# Patient Record
Sex: Female | Born: 1965
Health system: Southern US, Community
[De-identification: ages and names within clinical notes are randomized; demographics above are authoritative.]

## PROBLEM LIST (undated history)

## (undated) DIAGNOSIS — N362 Urethral caruncle: Secondary | ICD-10-CM

## (undated) DIAGNOSIS — A0472 Enterocolitis due to Clostridium difficile, not specified as recurrent: Secondary | ICD-10-CM

## (undated) DIAGNOSIS — Z9889 Other specified postprocedural states: Secondary | ICD-10-CM

## (undated) DIAGNOSIS — R87619 Unspecified abnormal cytological findings in specimens from cervix uteri: Secondary | ICD-10-CM

## (undated) DIAGNOSIS — F411 Generalized anxiety disorder: Secondary | ICD-10-CM

## (undated) DIAGNOSIS — A630 Anogenital (venereal) warts: Secondary | ICD-10-CM

## (undated) DIAGNOSIS — Z8742 Personal history of other diseases of the female genital tract: Secondary | ICD-10-CM

## (undated) DIAGNOSIS — M1990 Unspecified osteoarthritis, unspecified site: Secondary | ICD-10-CM

## (undated) DIAGNOSIS — R87629 Unspecified abnormal cytological findings in specimens from vagina: Secondary | ICD-10-CM

## (undated) DIAGNOSIS — J45909 Unspecified asthma, uncomplicated: Secondary | ICD-10-CM

## (undated) DIAGNOSIS — F988 Other specified behavioral and emotional disorders with onset usually occurring in childhood and adolescence: Secondary | ICD-10-CM

## (undated) DIAGNOSIS — N8110 Cystocele, unspecified: Secondary | ICD-10-CM

## (undated) DIAGNOSIS — E079 Disorder of thyroid, unspecified: Secondary | ICD-10-CM

## (undated) DIAGNOSIS — E785 Hyperlipidemia, unspecified: Secondary | ICD-10-CM

## (undated) DIAGNOSIS — Z8709 Personal history of other diseases of the respiratory system: Secondary | ICD-10-CM

## (undated) DIAGNOSIS — Z973 Presence of spectacles and contact lenses: Secondary | ICD-10-CM

## (undated) DIAGNOSIS — Z8639 Personal history of other endocrine, nutritional and metabolic disease: Secondary | ICD-10-CM

## (undated) DIAGNOSIS — E559 Vitamin D deficiency, unspecified: Secondary | ICD-10-CM

## (undated) DIAGNOSIS — F32A Depression, unspecified: Secondary | ICD-10-CM

## (undated) DIAGNOSIS — M8580 Other specified disorders of bone density and structure, unspecified site: Secondary | ICD-10-CM

## (undated) DIAGNOSIS — F909 Attention-deficit hyperactivity disorder, unspecified type: Secondary | ICD-10-CM

## (undated) DIAGNOSIS — F329 Major depressive disorder, single episode, unspecified: Secondary | ICD-10-CM

## (undated) DIAGNOSIS — B977 Papillomavirus as the cause of diseases classified elsewhere: Secondary | ICD-10-CM

## (undated) DIAGNOSIS — N3281 Overactive bladder: Secondary | ICD-10-CM

## (undated) DIAGNOSIS — F419 Anxiety disorder, unspecified: Secondary | ICD-10-CM

## (undated) DIAGNOSIS — R112 Nausea with vomiting, unspecified: Secondary | ICD-10-CM

## (undated) DIAGNOSIS — Z8489 Family history of other specified conditions: Secondary | ICD-10-CM

## (undated) HISTORY — PX: BLADDER SUSPENSION: SHX72

## (undated) HISTORY — PX: COLPOSCOPY: SHX161

## (undated) HISTORY — DX: Unspecified abnormal cytological findings in specimens from vagina: R87.629

## (undated) HISTORY — DX: Hyperlipidemia, unspecified: E78.5

## (undated) HISTORY — PX: LEEP: SHX91

## (undated) HISTORY — DX: Cystocele, unspecified: N81.10

## (undated) HISTORY — PX: AUGMENTATION MAMMAPLASTY: SUR837

## (undated) HISTORY — PX: COLONOSCOPY: SHX174

## (undated) HISTORY — DX: Disorder of thyroid, unspecified: E07.9

## (undated) HISTORY — DX: Papillomavirus as the cause of diseases classified elsewhere: B97.7

## (undated) HISTORY — PX: LAPAROTOMY: SHX154

## (undated) HISTORY — PX: ABDOMINAL HYSTERECTOMY: SHX81

## (undated) HISTORY — DX: Enterocolitis due to Clostridium difficile, not specified as recurrent: A04.72

## (undated) HISTORY — PX: BREAST SURGERY: SHX581

## (undated) HISTORY — DX: Anogenital (venereal) warts: A63.0

## (undated) HISTORY — DX: Unspecified abnormal cytological findings in specimens from cervix uteri: R87.619

---

## 2001-10-31 HISTORY — PX: ABDOMINAL HYSTERECTOMY: SHX81

## 2013-08-25 LAB — HM MAMMOGRAPHY: HM Mammogram: NORMAL

## 2014-08-25 ENCOUNTER — Encounter: Payer: Self-pay | Admitting: Family Medicine

## 2014-08-25 ENCOUNTER — Ambulatory Visit (INDEPENDENT_AMBULATORY_CARE_PROVIDER_SITE_OTHER): Payer: 59 | Admitting: Family Medicine

## 2014-08-25 VITALS — BP 132/80 | HR 69 | Ht 68.5 in | Wt 164.0 lb

## 2014-08-25 DIAGNOSIS — F900 Attention-deficit hyperactivity disorder, predominantly inattentive type: Secondary | ICD-10-CM

## 2014-08-25 DIAGNOSIS — R413 Other amnesia: Secondary | ICD-10-CM

## 2014-08-25 DIAGNOSIS — F909 Attention-deficit hyperactivity disorder, unspecified type: Secondary | ICD-10-CM

## 2014-08-25 DIAGNOSIS — F329 Major depressive disorder, single episode, unspecified: Secondary | ICD-10-CM | POA: Insufficient documentation

## 2014-08-25 DIAGNOSIS — K9041 Non-celiac gluten sensitivity: Secondary | ICD-10-CM

## 2014-08-25 DIAGNOSIS — K900 Celiac disease: Secondary | ICD-10-CM

## 2014-08-25 DIAGNOSIS — F32A Depression, unspecified: Secondary | ICD-10-CM | POA: Insufficient documentation

## 2014-08-25 DIAGNOSIS — R480 Dyslexia and alexia: Secondary | ICD-10-CM

## 2014-08-25 DIAGNOSIS — E559 Vitamin D deficiency, unspecified: Secondary | ICD-10-CM | POA: Insufficient documentation

## 2014-08-25 MED ORDER — AMPHETAMINE-DEXTROAMPHET ER 10 MG PO CP24: 10.0000 mg | ORAL_CAPSULE | Freq: Every day | ORAL | Status: DC

## 2014-08-25 NOTE — Progress Notes (Signed)
CC: Jacqueline Hughes is a 48 y.o. female is here for Establish Care   Subjective: HPI:  Patient complains of a least one year of diffuse joint pain, memory loss, fatigue, fluctuation in weight that has been present on a daily basis until she tried to eliminate gluten in her diet. It sounds like she was able to do this for about 3 months and had 99.9% resolution of the above symptoms. She restarted gluten in her diet because of "gluten withdrawal" that was not further specified. She wants to know if there is a blood test that can be done to check for celiac disease, she would like to avoid a small bowel biopsy if possible. She denies unintentional weight loss, skin changes, skin lesions, hair changes but does report vitamin deficiencies specifically vitamin D and she believes others that she forgets specifics about now. Interventions have included frequent checks of her thyroid which have been normal for the past 3 years but she was once on a low dose of Synthroid for a mildly elevated TSH.. Other than above nothing seems to make better or worse.  She further finds her memory loss as distractibility, difficulty with word finding, forgetting students names, and forgetting tasks that have been completed. Symptoms are improved without Adderall or Ritalin but are still interfering with quality of life even if she takes these medications.  Review of Systems - General ROS: negative for - chills, fever, night sweats, Ophthalmic ROS: negative for - decreased vision Psychological ROS: negative for - depression ENT ROS: negative for - hearing change, nasal congestion, tinnitus or allergies Hematological and Lymphatic ROS: negative for - bleeding problems, bruising or swollen lymph nodes Breast ROS: negative Respiratory ROS: no cough, shortness of breath, or wheezing Cardiovascular ROS: no chest pain or dyspnea on exertion Gastrointestinal ROS: no abdominal pain, change in bowel habits, or black or bloody  stools Genito-Urinary ROS: negative for - genital discharge, genital ulcers, incontinence or abnormal bleeding from genitals Musculoskeletal ROS: negative for - joint pain or muscle pain other than that described above Neurological ROS: negative for - headaches Dermatological ROS: negative for lumps, mole changes, rash and skin lesion changes  Past Medical History  Diagnosis Date  . Hyperlipidemia   . Thyroid disease     History reviewed. No pertinent past surgical history. Family History  Problem Relation Age of Onset  . Alcoholism      granparents  . Depression      aunts    History   Social History  . Marital Status: Married    Spouse Name: N/A    Number of Children: N/A  . Years of Education: N/A   Occupational History  . Not on file.   Social History Main Topics  . Smoking status: Former Smoker    Quit date: 08/25/2004  . Smokeless tobacco: Not on file  . Alcohol Use: No  . Drug Use: No  . Sexual Activity: Yes    Partners: Male   Other Topics Concern  . Not on file   Social History Narrative  . No narrative on file     Objective: BP 132/80  Pulse 69  Ht 5' 8.5" (1.74 m)  Wt 164 lb (74.39 kg)  BMI 24.57 kg/m2  Vital signs reviewed. General: Alert and Oriented, No Acute Distress HEENT: Pupils equal, round, reactive to light. Conjunctivae clear.  External ears unremarkable.  Moist mucous membranes. Lungs: Clear and comfortable work of breathing, speaking in full sentences without accessory muscle use. Cardiac: Regular  rate and rhythm.  Neuro: CN II-XII grossly intact, gait normal. Extremities: No peripheral edema.  Strong peripheral pulses.  Mental Status: No depression, anxiety, nor agitation. Logical though process. Tearful when talking about memory loss Skin: Warm and dry.  Assessment & Plan: [name] was seen today for establish care.  Diagnoses and associated orders for this visit:  Adult ADHD - amphetamine-dextroamphetamine (ADDERALL XR) 10 MG  24 hr capsule; Take 1 capsule (10 mg total) by mouth daily.  Gluten intolerance - Gliadin antibodies, serum - Tissue transglutaminase, IgA - Reticulin Antibody, IgA w reflex titer - IgA  Memory loss - Ambulatory referral to Neurology  Dyslexia    Adult ADHD: Encouraged her to restart taking her Adderall which she has been off of for about a month now.  Gluten intolerance: Checking labs above to rule out celiac disease Memory loss: We did a Mini-Mental state examination today where she had a score of 27. She has a master's degree in psychology. She reports she is carries a diagnosis of dyslexia and not surprisingly she had difficulty with spelling world backwards and would not consider trying subtracting serial sevens.  We will refer her for formal neuropsych testing given the severity of symptoms and persistence.  Return if symptoms worsen or fail to improve.

## 2014-08-26 LAB — GLIADIN ANTIBODIES, SERUM
Gliadin IgA: 14.6 U/mL (ref <20)
Gliadin IgG: 4.6 U/mL (ref <20)

## 2014-08-26 LAB — IGA: IgA: 592 mg/dL — ABNORMAL HIGH (ref 69–380)

## 2014-08-26 LAB — RETICULIN ANTIBODIES, IGA W TITER: Reticulin Ab, IgA: NEGATIVE

## 2014-08-26 LAB — TISSUE TRANSGLUTAMINASE, IGA: TISSUE TRANSGLUTAMINASE AB, IGA: 15.6 U/mL (ref <20)

## 2014-08-29 ENCOUNTER — Encounter: Payer: Self-pay | Admitting: Family Medicine

## 2014-08-29 DIAGNOSIS — K9049 Malabsorption due to intolerance, not elsewhere classified: Secondary | ICD-10-CM | POA: Insufficient documentation

## 2014-10-03 ENCOUNTER — Other Ambulatory Visit: Payer: Self-pay

## 2014-10-03 DIAGNOSIS — F909 Attention-deficit hyperactivity disorder, unspecified type: Secondary | ICD-10-CM

## 2014-10-03 MED ORDER — AMPHETAMINE-DEXTROAMPHET ER 10 MG PO CP24: 10.0000 mg | ORAL_CAPSULE | Freq: Every day | ORAL | Status: DC

## 2014-11-18 ENCOUNTER — Telehealth: Payer: Self-pay | Admitting: Family Medicine

## 2014-11-18 DIAGNOSIS — F909 Attention-deficit hyperactivity disorder, unspecified type: Secondary | ICD-10-CM

## 2014-11-18 MED ORDER — AMPHETAMINE-DEXTROAMPHET ER 10 MG PO CP24: 10.0000 mg | ORAL_CAPSULE | Freq: Every day | ORAL | Status: DC

## 2014-11-18 NOTE — Telephone Encounter (Signed)
Patient was in the bldg with son and stopped by to request refill for her Adderall.  She is wondering if you would be willing to write a script for this med for refills but date it in advance (and she cant get it filled until it is due).  I see that you have only seen her once in Oct. 2015.  I mentioned to her that i know with this type of medication, you would want to see her every three months and or possibly every six months.

## 2014-11-18 NOTE — Telephone Encounter (Signed)
Jacqueline Hughes, Rx placed in in-box ready for pickup/faxing. Based on my note I may not have told her that she needs to come in every 3 months for f/u.  I'll give her one more Rx to last her until f/u.

## 2014-11-19 NOTE — Telephone Encounter (Signed)
Message left on vm 

## 2015-01-05 ENCOUNTER — Encounter: Payer: Self-pay | Admitting: Family Medicine

## 2015-01-05 ENCOUNTER — Ambulatory Visit (INDEPENDENT_AMBULATORY_CARE_PROVIDER_SITE_OTHER): Payer: 59 | Admitting: Family Medicine

## 2015-01-05 VITALS — BP 125/83 | HR 81 | Wt 155.0 lb

## 2015-01-05 DIAGNOSIS — E559 Vitamin D deficiency, unspecified: Secondary | ICD-10-CM

## 2015-01-05 DIAGNOSIS — F909 Attention-deficit hyperactivity disorder, unspecified type: Secondary | ICD-10-CM

## 2015-01-05 DIAGNOSIS — F900 Attention-deficit hyperactivity disorder, predominantly inattentive type: Secondary | ICD-10-CM

## 2015-01-05 MED ORDER — AMPHETAMINE-DEXTROAMPHET ER 10 MG PO CP24: 10.0000 mg | ORAL_CAPSULE | Freq: Every day | ORAL | Status: DC

## 2015-01-05 MED ORDER — VITAMIN D (ERGOCALCIFEROL) 1.25 MG (50000 UNIT) PO CAPS: 50000.0000 [IU] | ORAL_CAPSULE | ORAL | Status: DC

## 2015-01-05 NOTE — Progress Notes (Signed)
CC: Jacqueline Hughes is a 49 y.o. female is here for f/u medications   Subjective: HPI:   Follow-up ADHD: She continues to take 10 mg of Adderall XR most days of the week, every work day but not always on the weekends. She tells me that she does not take this medication and goes to work she has been forgetful to the point where she has lost positions before. Provided she takes this on a daily basis she has no difficulty with distractibility or accountability at work. No known side effects from this medication. Denies sleep disturbance appetite suppression paranoia nor anxiety.  Follow vitamin D deficiency: She is asking for refill of her weekly vitamin D supplement. She tells me that in the past she has tried controlling her vitamin D level with over-the-counter vitamin D however has always been unsuccessful in her former PCP was only able to get her level stabilized by taking it weekly supplement indefinitely.   Review Of Systems Outlined In HPI  Past Medical History  Diagnosis Date  . Hyperlipidemia   . Thyroid disease     No past surgical history on file. Family History  Problem Relation Age of Onset  . Alcoholism      granparents  . Depression      aunts    History   Social History  . Marital Status: Married    Spouse Name: N/A  . Number of Children: N/A  . Years of Education: N/A   Occupational History  . Not on file.   Social History Main Topics  . Smoking status: Former Smoker    Quit date: 08/25/2004  . Smokeless tobacco: Not on file  . Alcohol Use: No  . Drug Use: No  . Sexual Activity:    Partners: Male   Other Topics Concern  . Not on file   Social History Narrative     Objective: BP 125/83 mmHg  Pulse 81  Wt 155 lb (70.308 kg)  Vital signs reviewed. General: Alert and Oriented, No Acute Distress HEENT: Pupils equal, round, reactive to light. Conjunctivae clear.  External ears unremarkable.  Moist mucous membranes. Lungs: Clear and comfortable work  of breathing, speaking in full sentences without accessory muscle use. Cardiac: Regular rate and rhythm.  Neuro: CN II-XII grossly intact, gait normal. Extremities: No peripheral edema.  Strong peripheral pulses.  Mental Status: No depression, anxiety, nor agitation. Logical though process. Skin: Warm and dry. Assessment & Plan: Sherian was seen today for f/u medications.  Diagnoses and all orders for this visit:  Adult ADHD Orders: -     Discontinue: amphetamine-dextroamphetamine (ADDERALL XR) 10 MG 24 hr capsule; Take 1 capsule (10 mg total) by mouth daily. -     Discontinue: amphetamine-dextroamphetamine (ADDERALL XR) 10 MG 24 hr capsule; Take 1 capsule (10 mg total) by mouth daily. -     amphetamine-dextroamphetamine (ADDERALL XR) 10 MG 24 hr capsule; Take 1 capsule (10 mg total) by mouth daily.  Vitamin D deficiency Orders: -     Vitamin D, Ergocalciferol, (DRISDOL) 50000 UNITS CAPS capsule; Take 1 capsule (50,000 Units total) by mouth every 7 (seven) days.   ADHD: Controlled continue 10 mg of Adderall daily but not necessarily on the weekends is not needed. Vitamin D deficiency: controlled,Refills provided of vitamin D  She's been able lose 10 pounds since I saw her last by cutting out creamer in her coffee.  Return in about 3 months (around 04/07/2015) for ADHD.

## 2015-01-26 ENCOUNTER — Encounter: Payer: Self-pay | Admitting: *Deleted

## 2015-01-26 ENCOUNTER — Emergency Department
Admission: EM | Admit: 2015-01-26 | Discharge: 2015-01-26 | Disposition: A | Discharge status: Home / Self Care | Payer: 59 | Source: Home / Self Care | Attending: Family Medicine | Admitting: Family Medicine

## 2015-01-26 DIAGNOSIS — B349 Viral infection, unspecified: Secondary | ICD-10-CM | POA: Diagnosis not present

## 2015-01-26 DIAGNOSIS — K120 Recurrent oral aphthae: Secondary | ICD-10-CM

## 2015-01-26 LAB — POCT CBC W AUTO DIFF (K'VILLE URGENT CARE)

## 2015-01-26 LAB — POCT RAPID STREP A (OFFICE): Rapid Strep A Screen: NEGATIVE

## 2015-01-26 MED ORDER — TRIAMCINOLONE ACETONIDE 0.1 % MT PSTE: PASTE | OROMUCOSAL | Status: DC

## 2015-01-26 NOTE — ED Notes (Signed)
Pt c/o bilateral ear ache, swollen lymph nodes in neck, sore in the right side of her mouth, and resolving rash all over her body x 1 wk.

## 2015-01-26 NOTE — ED Provider Notes (Signed)
CSN: [id number]     Arrival date & time 01/26/15  1907 History   First MD Initiated Contact with Patient 01/26/15 2006     Chief Complaint  Patient presents with  . Lymphadenopathy      HPI Comments: Patient reports that about 10 days ago she became fatigued with myalgias, and about  3 to 4 days later she developed aching in her ears and neck.  No sore throat or cough.  Her lips have been persistently dry and she has developed a sore spot in her right mouth.  About 5 days ago she developed a generalized pruritic rash that is fading.  She took Valtrex 1gm daily for four days.  The history is provided by the patient.    Past Medical History  Diagnosis Date  . Hyperlipidemia   . Thyroid disease    History reviewed. No pertinent past surgical history. Family History  Problem Relation Age of Onset  . Alcoholism      granparents  . Depression      aunts   History  Substance Use Topics  . Smoking status: Former Smoker    Quit date: 08/25/2004  . Smokeless tobacco: Not on file  . Alcohol Use: No   OB History    No data available     Review of Systems No sore throat No cough No pleuritic pain No wheezing No nasal congestion No post-nasal drainage No sinus pain/pressure No itchy/red eyes + earache No hemoptysis No SOB No fever/chills No nausea No vomiting No abdominal pain No diarrhea No urinary symptoms No skin rash + fatigue + myalgias/arthralgias No headache Used OTC meds without relief  Allergies  E-mycin  Home Medications   Prior to Admission medications   Medication Sig Start Date End Date Taking? Authorizing Provider  amphetamine-dextroamphetamine (ADDERALL XR) 10 MG 24 hr capsule Take 1 capsule (10 mg total) by mouth daily. 01/05/15  Yes Sean Hommel, DO  FLUoxetine (PROZAC) 20 MG capsule Take 20 mg by mouth daily.   Yes Historical Provider, MD  Multiple Vitamin (MULTIVITAMIN) tablet Take 1 tablet by mouth daily.   Yes Historical Provider, MD   valACYclovir (VALTREX) 1000 MG tablet Take 1,000 mg by mouth 2 (two) times daily.   Yes Historical Provider, MD  Vitamin D, Ergocalciferol, (DRISDOL) 50000 UNITS CAPS capsule Take 1 capsule (50,000 Units total) by mouth every 7 (seven) days. 01/05/15  Yes Sean Hommel, DO  triamcinolone (KENALOG) 0.1 % paste Place thin layer on mouth ulcers four times daily 01/26/15   Kandra Nicolas, MD   BP 113/78 mmHg  Pulse 79  Temp(Src) 98.1 F (36.7 C) (Oral)  Resp 16  Ht 5\' 8"  (1.727 m)  Wt 150 lb (68.04 kg)  BMI 22.81 kg/m2  SpO2 98% Physical Exam Nursing notes and Vital Signs reviewed. Appearance:  Patient appears stated age, and in no acute distress Eyes:  Pupils are equal, round, and reactive to light and accomodation.  Extraocular movement is intact.  Conjunctivae are not inflamed  Ears:  Canals normal.  Tympanic membranes normal.  Nose:  Mildly congested turbinates.  No sinus tenderness.   Mouth:  Shallow aphthous ulcer right buccal mucosae Pharynx:  Normal Neck:  Supple.  Tender shotty posterior nodes are palpated bilaterally.  There is tenderness over right axillary and supraclavicular nodes.  Lungs:  Clear to auscultation.  Breath sounds are equal.  Heart:  Regular rate and rhythm without murmurs, rubs, or gallops.  Abdomen:  Nontender without masses or hepatosplenomegaly.  Bowel sounds are present.  No CVA or flank tenderness.  Extremities:  No edema.  No calf tenderness Skin:  Faint macular erythematous rash on trunk and arms    ED Course  Procedures  None    Labs Reviewed  POCT CBC W AUTO DIFF ([facility]):  WBC 6.8; LY 37.0; MO 2.9; GR 60.1; Hgb 12.6; Platelets 329  POCT rapid strep test negative      MDM   1. Viral syndrome with exanthem; ?mono   2. Aphthous ulcer    Treat symptomatically for now:  Kenalog in [place] for aphthous ulcer. May take Ibuprofen 200mg , 4 tabs every 8 hours with food.  Increase fluid intake.  Check temperature daily. Followup with  Family Doctor if not improved in one week.     Kandra Nicolas, MD 01/31/15 [phone]

## 2015-01-26 NOTE — Discharge Instructions (Signed)
May take Ibuprofen 200mg , 4 tabs every 8 hours with food.  Increase fluid intake.  Check temperature daily.   Viral Exanthems, Adult Many viral infections of the skin are called viral exanthems. Exanthem is another name for a rash or skin eruption. The most common viral exanthems include the following:  [place] measles or rubella.  Measles or rubeola.  Roseola.  Parvovirus B19 (Erythema infectiosum or Fifth disease).  Chickenpox or varicella. DIAGNOSIS  Sometimes, other problems may cause a rash that looks like a viral exanthem. Most often, your caregiver can determine whether you have a viral exanthem by looking at the rash. They usually have distinct patterns or appearance. Lab work may be done if the diagnosis is uncertain. Sometimes, a small tissue sample (biopsy) of the rash may need to be taken. TREATMENT  Immunizations have led to a decrease in the number of cases of measles, mumps, and rubella. Viral exanthems may require clinical treatment if a bacterial infection or other problems follow. The rash may be associated with:  Minor sore throat.  Aches and pains.  Runny nose.  Watery eyes.  Tiredness.  Some coughs.  Gastrointestinal infections causing nausea, vomiting, and diarrhea. Viral exanthems do not respond to antibiotic medicines, because they are not caused by bacteria. HOME CARE INSTRUCTIONS   Only take over-the-counter or prescription medicines for pain, discomfort, diarrhea, or fever as directed by your caregiver.  Drink enough water and fluids to keep your urine clear or pale yellow. SEEK MEDICAL CARE IF:  You develop swollen neck glands. This may feel like lumps or bumps in the neck.  You develop tenderness over your sinuses.  You are not feeling partly better after 3 days.  You develop muscle aches.  You are feeling more tired than you would expect.  You get a persistent cough with mucus. SEEK IMMEDIATE MEDICAL CARE IF:   You have a  fever.  You develop red eyes or eye pain.  You develop sores in your mouth and difficulty drinking or eating.  You develop a sore throat with pus and difficulty swallowing.  You develop neck pain or a stiff neck.  You develop a severe headache.  You develop vomiting that will not stop. Document Released: 01/07/2003 Document Revised: 01/09/2012 Document Reviewed: 01/04/2011 [facility] Patient Information 2015 [place], [place]. This information is not intended to replace advice given to you by your health care provider. Make sure you discuss any questions you have with your health care provider.   Canker Sores  Canker sores are painful, open sores on the inside of the mouth and cheek. They may be white or yellow. The sores usually heal in 1 to 2 weeks. Women are more likely than men to have recurrent canker sores. CAUSES The cause of canker sores is not well understood. More than one cause is likely. Canker sores do not appear to be caused by certain types of germs (viruses or bacteria). Canker sores may be caused by:  An allergic reaction to certain foods.  Digestive problems.  Not having enough vitamin M84, folic acid, and iron.  Female sex hormones. Sores may come only during certain phases of a menstrual cycle. Often, there is improvement during pregnancy.  Genetics. Some people seem to inherit canker sore problems. Emotional stress and injuries to the mouth may trigger outbreaks, but not cause them.  DIAGNOSIS Canker sores are diagnosed by exam.  TREATMENT  Patients who have frequent bouts of canker sores may have cultures taken of the sores, blood tests,  or allergy tests. This helps determine if their sores are caused by a poor diet, an allergy, or some other preventable or treatable disease.  Vitamins may prevent recurrences or reduce the severity of canker sores in people with poor nutrition.  Numbing ointments can relieve pain. These are available in drug stores without a  prescription.  Anti-inflammatory steroid mouth rinses or gels may be prescribed by your caregiver for severe sores.  Oral steroids may be prescribed if you have severe, recurrent canker sores. These strong medicines can cause many side effects and should be used only under the close direction of a dentist or physician.  Mouth rinses containing the antibiotic medicine may be prescribed. They may lessen symptoms and speed healing. Healing usually happens in about 1 or 2 weeks with or without treatment. Certain antibiotic mouth rinses given to pregnant women and young children can permanently stain teeth. Talk to your caregiver about your treatment. HOME CARE INSTRUCTIONS   Avoid foods that cause canker sores for you.  Avoid citrus juices, spicy or salty foods, and coffee until the sores are healed.  Use a soft-bristled toothbrush.  Chew your food carefully to avoid biting your cheek.  Apply topical numbing medicine to the sore to help relieve pain.  Apply a thin paste of baking soda and water to the sore to help heal the sore.  Only use mouth rinses or medicines for pain or discomfort as directed by your caregiver. SEEK MEDICAL CARE IF:   Your symptoms are not better in 1 week.  Your sores are still present after 2 weeks.  Your sores are very painful.  You have trouble breathing or swallowing.  Your sores come back frequently. Document Released: 02/11/2011 Document Revised: 02/11/2013 Document Reviewed: 02/11/2011 [facility] Patient Information 2015 [place], [place]. This information is not intended to replace advice given to you by your health care provider. Make sure you discuss any questions you have with your health care provider.

## 2015-01-27 ENCOUNTER — Ambulatory Visit: Payer: 59 | Admitting: Family Medicine

## 2015-06-17 ENCOUNTER — Encounter: Payer: Self-pay | Admitting: Family Medicine

## 2015-06-17 ENCOUNTER — Ambulatory Visit (INDEPENDENT_AMBULATORY_CARE_PROVIDER_SITE_OTHER): Payer: 59 | Admitting: Family Medicine

## 2015-06-17 VITALS — BP 109/75 | HR 74 | Wt 153.0 lb

## 2015-06-17 DIAGNOSIS — Z1239 Encounter for other screening for malignant neoplasm of breast: Secondary | ICD-10-CM

## 2015-06-17 DIAGNOSIS — E559 Vitamin D deficiency, unspecified: Secondary | ICD-10-CM | POA: Diagnosis not present

## 2015-06-17 DIAGNOSIS — F900 Attention-deficit hyperactivity disorder, predominantly inattentive type: Secondary | ICD-10-CM | POA: Diagnosis not present

## 2015-06-17 DIAGNOSIS — M8580 Other specified disorders of bone density and structure, unspecified site: Secondary | ICD-10-CM | POA: Diagnosis not present

## 2015-06-17 DIAGNOSIS — F909 Attention-deficit hyperactivity disorder, unspecified type: Secondary | ICD-10-CM

## 2015-06-17 DIAGNOSIS — D171 Benign lipomatous neoplasm of skin and subcutaneous tissue of trunk: Secondary | ICD-10-CM

## 2015-06-17 MED ORDER — VALACYCLOVIR HCL 1 G PO TABS: 1000.0000 mg | ORAL_TABLET | Freq: Every day | ORAL | Status: DC | PRN

## 2015-06-17 MED ORDER — AMPHETAMINE-DEXTROAMPHET ER 10 MG PO CP24: 10.0000 mg | ORAL_CAPSULE | Freq: Every day | ORAL | Status: DC

## 2015-06-17 NOTE — Progress Notes (Signed)
CC: Jacqueline Hughes is a 49 y.o. female is here for lump on rt side and refill adderall   Subjective: HPI:   Follow-up osteopenia: In 2010 she was found to have a mild osteopenia value on a DEXA scan. She has not had a follow-up of this and is requesting one today. She denies any joint or bone pain. She has a history of vitamin D deficiency currently taking vitamin D supplements every week.  Follow-up ADHD: She did not take any Adderall over the summer. 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 Denies known side effects such as appetite suppression or weight loss or sleep disturbance  She had a mammogram done in 2014 which was normal, she is requesting a repeat to be done with no breast complaints or concerns other than normal screening for breast cancer.  She has a lump on her right upper quadrant of the abdomen periods been there since 2008. It's slightly tender to the touch. It does not seem to be getting larger or smaller since onset. She brought to the attention of her former PCP however this individual brushed off the patient's concerns and the patient is worried that it might be something that absolutely needs to be removed. She denies any additional weight loss, night sweats, fevers, chills nor overlying skin changes     Review Of Systems Outlined In HPI  Past Medical History  Diagnosis Date  . Hyperlipidemia   . Thyroid disease     No past surgical history on file. Family History  Problem Relation Age of Onset  . Alcoholism      granparents  . Depression      aunts    Social History   Social History  . Marital Status: Married    Spouse Name: N/A  . Number of Children: N/A  . Years of Education: N/A   Occupational History  . Not on file.   Social History Main Topics  . Smoking status: Former Smoker    Quit date: 08/25/2004  . Smokeless tobacco: Not  on file  . Alcohol Use: No  . Drug Use: No  . Sexual Activity:    Partners: Male   Other Topics Concern  . Not on file   Social History Narrative     Objective: BP 109/75 mmHg  Pulse 74  Wt 153 lb (69.4 kg)  General: Alert and Oriented, No Acute Distress HEENT: Pupils equal, round, reactive to light. Conjunctivae clear.  Moist pedis membranes Lungs: Clear to auscultation bilaterally, no wheezing/ronchi/rales.  Comfortable work of breathing. Good air movement. Cardiac: Regular rate and rhythm. Normal S1/S2.  No murmurs, rubs, nor gallops.   Abdomen: Soft flat nontender no palpable masses in the abdominal region however just above the costochondral junction of the seventh rib on the right there is a nontender spongy subcutaneous mass approximately 1 cm x 3 cm. Extremities: No peripheral edema.  Strong peripheral pulses.  Mental Status: No depression, anxiety, nor agitation. Skin: Warm and dry.  The palpated mass above was reviewed under ultrasound reflecting echotexture identical to surrounding fat however this nodule is encapsulated. There is no increased signal to suggest increased calcification within the mass.  Assessment & Plan: [name] was seen today for lump on rt side and refill adderall.  Diagnoses and all orders for this visit:  Vitamin D deficiency  Adult ADHD -  Discontinue: amphetamine-dextroamphetamine (ADDERALL XR) 10 MG 24 hr capsule; Take 1 capsule (10 mg total) by mouth daily. -     Discontinue: amphetamine-dextroamphetamine (ADDERALL XR) 10 MG 24 hr capsule; Take 1 capsule (10 mg total) by mouth daily. May fill on/after September 16th 2016 -     amphetamine-dextroamphetamine (ADDERALL XR) 10 MG 24 hr capsule; Take 1 capsule (10 mg total) by mouth daily. May fill on/after October 15th 2016  Screening for breast cancer -     MM DIGITAL SCREENING BILATERAL  Osteopenia -     DG Bone Density  Lipoma of abdominal wall  Other orders -     valACYclovir  (VALTREX) 1000 MG tablet; Take 1 tablet (1,000 mg total) by mouth daily as needed.  Reassurance provided that her exam is most suggestive of a benign lipoma, she's not interested in having it removed of its benign which seems reasonable. Vitamin D deficiency with history of osteopenia: An order has been placed for a DEXA scan Adult ADHD: Controlled with Adderall Screening for breast cancer: Referral has been placed Requesting refills on Valtrex her old prescription had expired for management of cold sores.  Return in about 3 months (around 09/17/2015).

## 2015-08-13 ENCOUNTER — Ambulatory Visit: Payer: 59

## 2015-08-13 ENCOUNTER — Ambulatory Visit (INDEPENDENT_AMBULATORY_CARE_PROVIDER_SITE_OTHER): Payer: 59

## 2015-08-13 DIAGNOSIS — M8580 Other specified disorders of bone density and structure, unspecified site: Secondary | ICD-10-CM | POA: Diagnosis not present

## 2015-08-16 ENCOUNTER — Encounter: Payer: Self-pay | Admitting: Family Medicine

## 2015-08-16 DIAGNOSIS — M8580 Other specified disorders of bone density and structure, unspecified site: Secondary | ICD-10-CM | POA: Insufficient documentation

## 2015-12-02 MED FILL — DEXTROAMP-AMPHET ER 10 MG C: 10 | 30 days supply | Qty: 30 | Fill #0

## 2015-12-28 ENCOUNTER — Telehealth: Payer: Self-pay | Admitting: Family Medicine

## 2015-12-28 ENCOUNTER — Other Ambulatory Visit: Payer: Self-pay

## 2015-12-28 DIAGNOSIS — F909 Attention-deficit hyperactivity disorder, unspecified type: Secondary | ICD-10-CM

## 2015-12-28 MED ORDER — AMPHETAMINE-DEXTROAMPHET ER 10 MG PO CP24: 10.0000 mg | ORAL_CAPSULE | Freq: Every day | ORAL | Status: DC

## 2015-12-28 NOTE — Telephone Encounter (Signed)
Pt is allowed to have a 30 day supply (Per Dr. Ileene Rubens) but need a follow up appointment for more refills

## 2015-12-28 NOTE — Telephone Encounter (Signed)
Pt came in and stated she needs a refill on her adderall ... Thanks

## 2015-12-28 NOTE — Telephone Encounter (Signed)
Pt came in and stated she needs a refill on her adderall .. thanks

## 2016-01-20 ENCOUNTER — Encounter: Payer: Self-pay | Admitting: Family Medicine

## 2016-01-20 ENCOUNTER — Ambulatory Visit (INDEPENDENT_AMBULATORY_CARE_PROVIDER_SITE_OTHER): Payer: 59 | Admitting: Family Medicine

## 2016-01-20 VITALS — BP 128/88 | HR 69 | Wt 153.0 lb

## 2016-01-20 DIAGNOSIS — E559 Vitamin D deficiency, unspecified: Secondary | ICD-10-CM | POA: Diagnosis not present

## 2016-01-20 DIAGNOSIS — F329 Major depressive disorder, single episode, unspecified: Secondary | ICD-10-CM | POA: Diagnosis not present

## 2016-01-20 DIAGNOSIS — F900 Attention-deficit hyperactivity disorder, predominantly inattentive type: Secondary | ICD-10-CM | POA: Diagnosis not present

## 2016-01-20 DIAGNOSIS — F32A Depression, unspecified: Secondary | ICD-10-CM

## 2016-01-20 DIAGNOSIS — B001 Herpesviral vesicular dermatitis: Secondary | ICD-10-CM

## 2016-01-20 DIAGNOSIS — F909 Attention-deficit hyperactivity disorder, unspecified type: Secondary | ICD-10-CM

## 2016-01-20 MED ORDER — VITAMIN D (ERGOCALCIFEROL) 1.25 MG (50000 UNIT) PO CAPS: 50000.0000 [IU] | ORAL_CAPSULE | ORAL | Status: DC

## 2016-01-20 MED ORDER — ATOMOXETINE HCL 40 MG PO CAPS: 40.0000 mg | ORAL_CAPSULE | Freq: Every day | ORAL | Status: DC

## 2016-01-20 MED ORDER — VALACYCLOVIR HCL 1 G PO TABS: 1000.0000 mg | ORAL_TABLET | Freq: Every day | ORAL | Status: DC | PRN

## 2016-01-20 MED ORDER — FLUOXETINE HCL 20 MG PO CAPS: 20.0000 mg | ORAL_CAPSULE | Freq: Every day | ORAL | Status: DC

## 2016-01-20 MED FILL — FLUoxetine HCL 20 MG CAPS: 20 | 90 days supply | Qty: 90 | Fill #0 | Status: TO

## 2016-01-20 MED FILL — STRATTERA 40 MG CAPSULE: 40 | 30 days supply | Qty: 30 | Fill #0

## 2016-01-20 MED FILL — VIT D2 1.25 MG (50,000 UNIT: 1.25 MG | 84 days supply | Qty: 12 | Fill #0 | Status: TO

## 2016-01-20 MED FILL — valACYclovir HCL 1 GM TABS: 1 | 30 days supply | Qty: 30 | Fill #0

## 2016-01-20 NOTE — Progress Notes (Signed)
CC: Jacqueline Hughes is a 50 y.o. female is here for Medication Refill   Subjective: HPI:  Follow-up ADHD: Taking 10 mg of Adderall XR on the days that she has to work. She does not believe that its fully effective and doses higher than 10 mg caused intolerable dry mouth. She tells me even though she's taking this medication on the days that she takes it she still feels like she's scatterbrained another's point out that she just can't sit still. Symptoms are interfering with her job but not to a degree where her job is in jeopardy. She wants something different she can take to help with the symptoms.  Follow-up depression: Taking Prozac on a daily basis with no known side effects. She denies any anxiety or depression.  She is requesting a refill on vitamin D for persistent vitamin D deficiency.  She continues to get cold sores if she is stressed out. For example her son was recently seen at a local emergency room for tachycardia and 3 days after this event she developed a cold sore. She is requesting refills on Valtrex   Review Of Systems Outlined In HPI  Past Medical History  Diagnosis Date  . Hyperlipidemia   . Thyroid disease     No past surgical history on file. Family History  Problem Relation Age of Onset  . Alcoholism      granparents  . Depression      aunts    Social History   Social History  . Marital Status: Married    Spouse Name: N/A  . Number of Children: N/A  . Years of Education: N/A   Occupational History  . Not on file.   Social History Main Topics  . Smoking status: Former Smoker    Quit date: 08/25/2004  . Smokeless tobacco: Not on file  . Alcohol Use: No  . Drug Use: No  . Sexual Activity:    Partners: Male   Other Topics Concern  . Not on file   Social History Narrative     Objective: BP 128/88 mmHg  Pulse 69  Wt 153 lb (69.4 kg)  Vital signs reviewed. General: Alert and Oriented, No Acute Distress HEENT: Pupils equal, round, reactive  to light. Conjunctivae clear.  External ears unremarkable.  Moist mucous membranes. Lungs: Clear and comfortable work of breathing, speaking in full sentences without accessory muscle use. Cardiac: Regular rate and rhythm.  Neuro: CN II-XII grossly intact, gait normal. Extremities: No peripheral edema.  Strong peripheral pulses.  Mental Status: No depression, anxiety, nor agitation. Logical though process. Skin: Warm and dry.  Assessment & Plan: Jacqueline Hughes was seen today for medication refill.  Diagnoses and all orders for this visit:  Vitamin D deficiency -     Vitamin D, Ergocalciferol, (DRISDOL) 50000 units CAPS capsule; Take 1 capsule (50,000 Units total) by mouth every 7 (seven) days.  Adult ADHD -     atomoxetine (STRATTERA) 40 MG capsule; Take 1 capsule (40 mg total) by mouth daily.  Depression -     FLUoxetine (PROZAC) 20 MG capsule; Take 1 capsule (20 mg total) by mouth daily.  Recurrent cold sores -     valACYclovir (VALTREX) 1000 MG tablet; Take 1 tablet (1,000 mg total) by mouth daily as needed. For cold sores.  Other orders -     Cancel: amphetamine-dextroamphetamine (ADDERALL XR) 10 MG 24 hr capsule; Take 1 capsule (10 mg total) by mouth daily. NEED A FOLLOW UP APPOINTMENT FOR MORE REFILLS  Vitamin D deficiency: Controlled with weekly vitamin D replacement Adult ADHD: Uncontrolled chronic condition switching to Straterra, if beneficial after 1-2 weeks of taking this medication please call for 6 months of refills. If ineffective please call to switch back to Adderall. I reminded her that if she is taking Adderall she needs to follow-up every 3-6 months since this is a controlled substance. Recurrent cold sores: Controlled with as needed use of Valtrex Depression: Controlled with Prozac  Return in about 6 months (around 07/22/2016).

## 2016-02-17 ENCOUNTER — Emergency Department
Admission: EM | Admit: 2016-02-17 | Discharge: 2016-02-17 | Disposition: A | Discharge status: Home / Self Care | Payer: 59 | Source: Home / Self Care | Attending: Family Medicine | Admitting: Family Medicine

## 2016-02-17 ENCOUNTER — Encounter: Payer: Self-pay | Admitting: *Deleted

## 2016-02-17 DIAGNOSIS — H6983 Other specified disorders of Eustachian tube, bilateral: Secondary | ICD-10-CM | POA: Diagnosis not present

## 2016-02-17 DIAGNOSIS — H9203 Otalgia, bilateral: Secondary | ICD-10-CM

## 2016-02-17 DIAGNOSIS — J029 Acute pharyngitis, unspecified: Secondary | ICD-10-CM | POA: Diagnosis not present

## 2016-02-17 HISTORY — DX: Depression, unspecified: F32.A

## 2016-02-17 HISTORY — DX: Major depressive disorder, single episode, unspecified: F32.9

## 2016-02-17 HISTORY — DX: Other specified disorders of bone density and structure, unspecified site: M85.80

## 2016-02-17 HISTORY — DX: Vitamin D deficiency, unspecified: E55.9

## 2016-02-17 HISTORY — DX: Anxiety disorder, unspecified: F41.9

## 2016-02-17 HISTORY — DX: Other specified behavioral and emotional disorders with onset usually occurring in childhood and adolescence: F98.8

## 2016-02-17 LAB — POCT RAPID STREP A (OFFICE): Rapid Strep A Screen: NEGATIVE

## 2016-02-17 MED ORDER — IBUPROFEN 800 MG PO TABS: 800.0000 mg | ORAL_TABLET | Freq: Three times a day (TID) | ORAL | Status: DC

## 2016-02-17 MED ORDER — BENZOCAINE-MENTHOL 15-10 MG MT LOZG: 1.0000 | LOZENGE | Freq: Four times a day (QID) | OROMUCOSAL | Status: DC | PRN

## 2016-02-17 MED ORDER — FLUTICASONE PROPIONATE 50 MCG/ACT NA SUSP: 2.0000 | Freq: Every day | NASAL | Status: DC

## 2016-02-17 MED ORDER — PREDNISONE 20 MG PO TABS: ORAL_TABLET | ORAL | Status: DC

## 2016-02-17 NOTE — Discharge Instructions (Signed)
You may take 400-600mg  Ibuprofen (Motrin) every 6-8 hours for fever and pain  Alternate with Tylenol  You may take 500mg  Tylenol every 4-6 hours as needed for fever and pain  Follow-up with your primary care provider next week for recheck of symptoms if not improving.  Be sure to drink plenty of fluids and rest, at least 8hrs of sleep a night, preferably more while you are sick. Return urgent care or go to closest ER if you cannot keep down fluids/signs of dehydration, fever not reducing with Tylenol, difficulty breathing/wheezing, stiff neck, worsening condition, or other concerns (see below)   Your symptoms are likely due to a virus such as the common cold, however, if you developing worsening sore throat with difficulty breathing or swallowing, persistent fever (>100.4*F) for 3 days, or symptoms not improving in 4-5 days please call our office or follow up with your primary care provider.    Your rapid strep test did come back Negative but a culture has been sent.  It usually takes 2-3 days to result.  If the test is Positive, you will be called and an antibiotic can be called in for you.

## 2016-02-17 NOTE — ED Notes (Signed)
Pt c/o sore throat and bilateral ear pain x 8 days. She reports chills one night, but is uncertain if she has had a fever. She has taken claritin and Benadryl.

## 2016-02-17 NOTE — ED Provider Notes (Signed)
CSN: [id number]     Arrival date & time 02/17/16  1711 History   First MD Initiated Contact with Patient 02/17/16 1731     Chief Complaint  Patient presents with  . Sore Throat  . Otalgia   (Consider location/radiation/quality/duration/timing/severity/associated sxs/prior Treatment) HPI  The pt is a 50yo female presenting to [facility] with c/o 8 days of sore throat and bilateral ear pain and pressure.  Throat pain is moderate to severe, worse with swallowing.  Mild intermittent dry cough when she is talking or her throat gets irritated. She has had chills but denies headache, abdominal pain, n/v/d. 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  She has been taking Claritin, Benadryl and Tylenol without relief of symptoms.  Denies difficulty breathing. She has been able to keep down fluids.   Past Medical History  Diagnosis Date  . Hyperlipidemia   . Thyroid disease   . ADD (attention deficit disorder)   . Vitamin D deficiency   . Osteopenia   . Anxiety   . Depression    Past Surgical History  Procedure Laterality Date  . Abdominal hysterectomy    . Breast surgery    . Laparotomy     Family History  Problem Relation Age of Onset  . Alcoholism      granparents  . Depression      aunts   Social History  Substance Use Topics  . Smoking status: Former Smoker    Quit date: 08/25/2004  . Smokeless tobacco: None  . Alcohol Use: Yes   OB History    No data available     Review of Systems  Constitutional: Positive for chills. Negative for fever, appetite change and fatigue.  HENT: Positive for ear pain ( bilateral) and sore throat. Negative for congestion, rhinorrhea, sinus pressure, sneezing, trouble swallowing and voice change.   Respiratory: Positive for cough. Negative for shortness of breath.   Cardiovascular: Negative for chest pain and palpitations.  Gastrointestinal: Negative for nausea, vomiting, abdominal pain and diarrhea.  Musculoskeletal: Negative for myalgias, back pain and  arthralgias.  Skin: Negative for rash.  All other systems reviewed and are negative.   Allergies  E-mycin  Home Medications   Prior to Admission medications   Medication Sig Start Date End Date Taking? Authorizing Provider  atomoxetine (STRATTERA) 40 MG capsule Take 1 capsule (40 mg total) by mouth daily. 01/20/16   Sean Hommel, DO  Benzocaine-Menthol (CHLORASEPTIC MAX SORE THROAT) 15-10 MG LOZG Use as directed 1 lozenge in the mouth or throat 4 (four) times daily as needed. 02/17/16   Noland Fordyce, PA-C  FLUoxetine (PROZAC) 20 MG capsule Take 1 capsule (20 mg total) by mouth daily. 01/20/16   Sean Hommel, DO  fluticasone (FLONASE) 50 MCG/ACT nasal spray Place 2 sprays into both nostrils daily. For 1 week, then daily as needed seasonally 02/17/16   Noland Fordyce, PA-C  ibuprofen (ADVIL,MOTRIN) 800 MG tablet Take 1 tablet (800 mg total) by mouth 3 (three) times daily. 02/17/16   Noland Fordyce, PA-C  Multiple Vitamin (MULTIVITAMIN) tablet Take 1 tablet by mouth daily.    Historical Provider, MD  predniSONE (DELTASONE) 20 MG tablet 3 tabs po day one, then 2 po daily x 4 days 02/17/16   Noland Fordyce, PA-C  valACYclovir (VALTREX) 1000 MG tablet Take 1 tablet (1,000 mg total) by mouth daily as needed. For cold sores. 01/20/16   Marcial Pacas, DO  Vitamin D, Ergocalciferol, (DRISDOL) 50000 units CAPS capsule Take 1 capsule (50,000 Units  total) by mouth every 7 (seven) days. 01/20/16   Marcial Pacas, DO   Meds Ordered and Administered this Visit  Medications - No data to display  BP 122/84 mmHg  Pulse 83  Temp(Src) 98.3 F (36.8 C) (Oral)  Resp 16  Ht 5' 8.5" (1.74 m)  Wt 156 lb (70.761 kg)  BMI 23.37 kg/m2  SpO2 100% No data found.   Physical Exam  Constitutional: She appears well-developed and well-nourished. No distress.  HENT:  Head: Normocephalic and atraumatic.  Right Ear: Tympanic membrane normal. Tympanic membrane is not erythematous and not bulging.  Left Ear: Tympanic membrane  normal. Tympanic membrane is not erythematous and not bulging.  Nose: Mucosal edema present. Right sinus exhibits no maxillary sinus tenderness and no frontal sinus tenderness. Left sinus exhibits no maxillary sinus tenderness and no frontal sinus tenderness.  Mouth/Throat: Uvula is midline and mucous membranes are normal. Posterior oropharyngeal edema and posterior oropharyngeal erythema present. No oropharyngeal exudate or tonsillar abscesses.  Eyes: Conjunctivae are normal. No scleral icterus.  Neck: Normal range of motion. Neck supple.  Cardiovascular: Normal rate, regular rhythm and normal heart sounds.   Pulmonary/Chest: Effort normal and breath sounds normal. No stridor. No respiratory distress. She has no wheezes. She has no rales.  Musculoskeletal: Normal range of motion.  Lymphadenopathy:    She has no cervical adenopathy.  Neurological: She is alert.  Skin: Skin is warm and dry. She is not diaphoretic.  Nursing note and vitals reviewed.   ED Course  Procedures (including critical care time)  Labs Review Labs Reviewed  STREP A DNA PROBE  POCT RAPID STREP A (OFFICE)    Imaging Review No results found.    MDM   1. Acute pharyngitis, unspecified etiology   2. Ear pain, bilateral   3. Eustachian tube dysfunction, bilateral    Pt c/o bilateral ear pain and sore throat for 8 days, associated dry cough. Pt is afebrile, moist mucous membranes. Tonsillar erythema and edema w/o exudates. No evidence of peritonsillar abscess.  Tympanometry: Negative peak pressure in Left and Right ear, c/w eustachian tube dysfunction, common cold or allergies.  Rapid strep: NEGATIVE Will send strep culture  Reassured pt symptoms are likely viral. Encouraged symptomatic treatment. Rx: Prednisone, Flonase, Ibuprofen 800mg , and benzocaine-menthol lozenges   Encouraged fluids and rest. Work note provided for tomorrow. Advised pt culture results should come back in 2-3 days, antibiotic will be  called in if indicated.  She may call office if she develops persistent fever or not improving in 4-5 days.  F/u with PCP if not improving in 1 week. Patient verbalized understanding and agreement with treatment plan.     Noland Fordyce, PA-C 02/17/16 1811

## 2016-02-23 ENCOUNTER — Telehealth: Payer: Self-pay | Admitting: *Deleted

## 2016-02-23 LAB — STREP A DNA PROBE: GASP: NOT DETECTED

## 2016-02-23 MED ORDER — AMOXICILLIN 875 MG PO TABS: 875.0000 mg | ORAL_TABLET | Freq: Two times a day (BID) | ORAL | Status: DC

## 2016-08-22 ENCOUNTER — Other Ambulatory Visit: Payer: Self-pay | Admitting: Family Medicine

## 2016-08-22 DIAGNOSIS — Z1231 Encounter for screening mammogram for malignant neoplasm of breast: Secondary | ICD-10-CM

## 2016-08-30 ENCOUNTER — Ambulatory Visit (INDEPENDENT_AMBULATORY_CARE_PROVIDER_SITE_OTHER): Payer: 59

## 2016-08-30 ENCOUNTER — Ambulatory Visit (INDEPENDENT_AMBULATORY_CARE_PROVIDER_SITE_OTHER): Payer: 59 | Admitting: Physician Assistant

## 2016-08-30 VITALS — BP 115/71 | HR 77 | Temp 98.3°F | Wt 146.8 lb

## 2016-08-30 DIAGNOSIS — Z1231 Encounter for screening mammogram for malignant neoplasm of breast: Secondary | ICD-10-CM

## 2016-08-30 DIAGNOSIS — Z23 Encounter for immunization: Secondary | ICD-10-CM

## 2016-08-30 MED FILL — FLUoxetine HCL 20 MG CAPS: 20 | 90 days supply | Qty: 90 | Fill #0

## 2016-08-30 NOTE — Progress Notes (Signed)
Patient came into clinic today for flu and tdap vaccination. Patient tolerated injection of flu immunization in right deltoid well, with no immediate complications. Patient tolerated tdap immunization in left deltoid well, no immediate complications. Advised to contact our office with any questions/concerns.

## 2016-09-16 ENCOUNTER — Encounter: Payer: Self-pay | Admitting: Physician Assistant

## 2016-09-16 ENCOUNTER — Ambulatory Visit (INDEPENDENT_AMBULATORY_CARE_PROVIDER_SITE_OTHER): Payer: 59 | Admitting: Physician Assistant

## 2016-09-16 VITALS — BP 120/60 | HR 73 | Ht 68.5 in | Wt 147.0 lb

## 2016-09-16 DIAGNOSIS — Z131 Encounter for screening for diabetes mellitus: Secondary | ICD-10-CM | POA: Diagnosis not present

## 2016-09-16 DIAGNOSIS — F330 Major depressive disorder, recurrent, mild: Secondary | ICD-10-CM | POA: Diagnosis not present

## 2016-09-16 DIAGNOSIS — Z13220 Encounter for screening for lipoid disorders: Secondary | ICD-10-CM

## 2016-09-16 MED ORDER — FLUOXETINE HCL 20 MG PO CAPS: 20.0000 mg | ORAL_CAPSULE | Freq: Every day | ORAL | 3 refills | Status: DC

## 2016-09-16 NOTE — Progress Notes (Signed)
   Subjective:    Patient ID: Jacqueline Hughes, female    DOB: 01-Apr-1966, 50 y.o.   MRN: [id number]  HPI Pt is a 50 yo female who presents to the clinic to establish care.   .. Active Ambulatory Problems    Diagnosis Date Noted  . Adult ADHD 08/25/2014  . Depression 08/25/2014  . Vitamin D deficiency 08/25/2014  . Dyslexia 08/25/2014  . Food intolerance 08/29/2014  . Osteopenia 08/16/2015  . Recurrent cold sores 01/20/2016   Resolved Ambulatory Problems    Diagnosis Date Noted  . No Resolved Ambulatory Problems   Past Medical History:  Diagnosis Date  . ADD (attention deficit disorder)   . Anxiety   . Depression   . Hyperlipidemia   . Osteopenia   . Thyroid disease   . Vitamin D deficiency    . Family History  Problem Relation Age of Onset  . Alcoholism      granparents  . Depression      aunts   .[name] Social History   Social History  . Marital status: Married    Spouse name: N/A  . Number of children: N/A  . Years of education: N/A   Occupational History  . Not on file.   Social History Main Topics  . Smoking status: Former Smoker    Quit date: 08/25/2004  . Smokeless tobacco: Not on file  . Alcohol use Yes  . Drug use: No  . Sexual activity: Yes    Partners: Male   Other Topics Concern  . Not on file   Social History Narrative  . No narrative on file   Pt needs refill on prozac for depression. She is controlled on this medication. She occasonally has "down" moments in the winter but able to push through. No side effects with medication. She has been controlled for at least 2 years.    Review of Systems  All other systems reviewed and are negative.      Objective:   Physical Exam  Constitutional: She is oriented to person, place, and time. She appears well-developed and well-nourished.  HENT:  Head: Normocephalic and atraumatic.  Cardiovascular: Normal rate, regular rhythm and normal heart sounds.   Pulmonary/Chest: Effort normal and breath  sounds normal.  Neurological: She is alert and oriented to person, place, and time.  Psychiatric: She has a normal mood and affect. Her behavior is normal.          Assessment & Plan:  Marland KitchenMarland KitchenDiagnoses and all orders for this visit:  Mild episode of recurrent major depressive disorder (HCC) -     FLUoxetine (PROZAC) 20 MG capsule; Take 1 capsule (20 mg total) by mouth daily.  Screening for lipid disorders -     Lipid panel  Screening for diabetes mellitus -     COMPLETE METABOLIC PANEL WITH GFR   PHQ-9 was 4.  prozac refilled for one year.  Screening labs ordered.  Follow up in 1 year or as needed.

## 2016-10-12 MED FILL — VIT D2 1.25 MG (50,000 UNIT: 1.25 MG | 84 days supply | Qty: 12 | Fill #0

## 2016-11-20 ENCOUNTER — Encounter: Payer: Self-pay | Admitting: Emergency Medicine

## 2016-11-20 ENCOUNTER — Emergency Department
Admission: EM | Admit: 2016-11-20 | Discharge: 2016-11-20 | Disposition: A | Discharge status: Home / Self Care | Payer: 59 | Source: Home / Self Care | Attending: Family Medicine | Admitting: Family Medicine

## 2016-11-20 DIAGNOSIS — L03213 Periorbital cellulitis: Secondary | ICD-10-CM

## 2016-11-20 DIAGNOSIS — H1089 Other conjunctivitis: Secondary | ICD-10-CM | POA: Diagnosis not present

## 2016-11-20 MED ORDER — PREDNISONE 20 MG PO TABS: ORAL_TABLET | ORAL | 0 refills | Status: DC

## 2016-11-20 MED ORDER — CLINDAMYCIN HCL 300 MG PO CAPS: 300.0000 mg | ORAL_CAPSULE | Freq: Three times a day (TID) | ORAL | 0 refills | Status: DC

## 2016-11-20 MED ORDER — METHYLPREDNISOLONE SODIUM SUCC 125 MG IJ SOLR
80.0000 mg | Freq: Once | INTRAMUSCULAR | Status: AC
  Administered 2016-11-20: 80 mg via INTRAMUSCULAR

## 2016-11-20 MED ORDER — POLYMYXIN B-TRIMETHOPRIM 10000-0.1 UNIT/ML-% OP SOLN: 1.0000 [drp] | OPHTHALMIC | 0 refills | Status: DC

## 2016-11-20 NOTE — Discharge Instructions (Signed)
Begin prednisone Monday, 11/21/16. Continue to apply cool compress several times daily.  May take Tylenol as needed for pain.  If symptoms become significantly worse during the night or over the weekend, proceed to the local emergency room.

## 2016-11-20 NOTE — ED Provider Notes (Signed)
[facility]    CSN: [id number] Arrival date & time: 11/20/16  1327     History   Chief Complaint Chief Complaint  Patient presents with  . Conjunctivitis    HPI Jacqueline Hughes is a 51 y.o. female.   Patient complains of onset of left pinkeye one week ago, with itching and increased lacrimation.  No recent cold or URI symptoms.  During the past two days she has had increased swelling and soreness of her left upper and lower lids.  No photophobia.  No pain with eye movement.  No changes in vision.  No fevers, chills, and sweats.   The history is provided by the patient.    Past Medical History:  Diagnosis Date  . ADD (attention deficit disorder)   . Anxiety   . Depression   . Hyperlipidemia   . Osteopenia   . Thyroid disease   . Vitamin D deficiency     Patient Active Problem List   Diagnosis Date Noted  . Recurrent cold sores 01/20/2016  . Osteopenia 08/16/2015  . Food intolerance 08/29/2014  . Adult ADHD 08/25/2014  . Depression 08/25/2014  . Vitamin D deficiency 08/25/2014  . Dyslexia 08/25/2014    Past Surgical History:  Procedure Laterality Date  . ABDOMINAL HYSTERECTOMY    . BREAST SURGERY    . LAPAROTOMY      OB History    No data available       Home Medications    Prior to Admission medications   Medication Sig Start Date End Date Taking? Authorizing Provider  clindamycin (CLEOCIN) 300 MG capsule Take 1 capsule (300 mg total) by mouth 3 (three) times daily. (take every 8 hours) 11/20/16   Kandra Nicolas, MD  FLUoxetine (PROZAC) 20 MG capsule Take 1 capsule (20 mg total) by mouth daily. 09/16/16   Jade L Breeback, PA-C  ibuprofen (ADVIL,MOTRIN) 800 MG tablet Take 1 tablet (800 mg total) by mouth 3 (three) times daily. 02/17/16   Noland Fordyce, PA-C  Multiple Vitamin (MULTIVITAMIN) tablet Take 1 tablet by mouth daily.    Historical Provider, MD  predniSONE (DELTASONE) 20 MG tablet Take one tab by mouth twice daily for 4 days, then one  daily for 3 days. Take with food. 11/20/16   Kandra Nicolas, MD  trimethoprim-polymyxin b (POLYTRIM) ophthalmic solution Place 1 drop into the left eye every 4 (four) hours. 11/20/16   Kandra Nicolas, MD  valACYclovir (VALTREX) 1000 MG tablet Take 1 tablet (1,000 mg total) by mouth daily as needed. For cold sores. 01/20/16   Marcial Pacas, DO  Vitamin D, Ergocalciferol, (DRISDOL) 50000 units CAPS capsule Take 1 capsule (50,000 Units total) by mouth every 7 (seven) days. 01/20/16   Marcial Pacas, DO    Family History Family History  Problem Relation Age of Onset  . Alcoholism      granparents  . Depression      aunts    Social History Social History  Substance Use Topics  . Smoking status: Former Smoker    Quit date: 08/25/2004  . Smokeless tobacco: Never Used  . Alcohol use Yes     Allergies   E-mycin [erythromycin]   Review of Systems Review of Systems No sore throat No cough No pleuritic pain No wheezing + nasal congestion ? post-nasal drainage No sinus pain/pressure + itchy/red left eye No earache No hemoptysis No SOB No fever/chills No nausea No vomiting No abdominal pain No diarrhea No urinary symptoms No skin  rash + fatigue No myalgias No headache Used OTC meds without relief   Physical Exam Triage Vital Signs ED Triage Vitals  Enc Vitals Group     BP 11/20/16 1425 114/74     Pulse Rate 11/20/16 1425 80     Resp 11/20/16 1425 16     Temp 11/20/16 1425 98.3 F (36.8 C)     Temp Source 11/20/16 1425 Oral     SpO2 11/20/16 1425 96 %     Weight 11/20/16 1426 146 lb (66.2 kg)     Height 11/20/16 1426 5' 8.5" (1.74 m)     Head Circumference --      Peak Flow --      Pain Score 11/20/16 1427 0     Pain Loc --      Pain Edu? --      Excl. in [place]? --    No data found.   Updated Vital Signs BP 114/74 (BP Location: Left Arm)   Pulse 80   Temp 98.3 F (36.8 C) (Oral)   Resp 16   Ht 5' 8.5" (1.74 m)   Wt 146 lb (66.2 kg)   SpO2 96%   BMI 21.88  kg/m   Visual Acuity Right Eye Distance:   Left Eye Distance:   Bilateral Distance:    Right Eye Near:   Left Eye Near:    Bilateral Near:     Physical Exam Nursing notes and Vital Signs reviewed. Appearance:  Patient appears stated age, and in no acute distress Eyes:  Pupils are equal, round, and reactive to light and accomodation.  Extraocular movement is intact.   Left conjunctivae injected with increased lacrimation.  Left upper and lower eyelids mildly edematous, erythematous and tender to palpation.  No photophobia.  Left fundi benign. Ears:  Canals normal.  Tympanic membranes normal.  Nose:  Mildly congested turbinates.  No sinus tenderness.   Pharynx:  Normal Neck:  Supple.  No adenopathy Lungs:  Clear to auscultation.  Breath sounds are equal.  Moving air well. Heart:  Regular rate and rhythm without murmurs, rubs, or gallops.  Abdomen:  Nontender without masses or hepatosplenomegaly.  Bowel sounds are present.  No CVA or flank tenderness.  Extremities:  No edema.  Skin:  No rash present.    UC Treatments / Results  Labs (all labs ordered are listed, but only abnormal results are displayed) Labs Reviewed  WOUND CULTURE    EKG  EKG Interpretation None       Radiology No results found.  Procedures Procedures (including critical care time)  Medications Ordered in UC Medications  methylPREDNISolone sodium succinate (SOLU-MEDROL) 125 mg/2 mL injection 80 mg (not administered)     Initial Impression / Assessment and Plan / UC Course  I have reviewed the triage vital signs and the nursing notes.  Pertinent labs & imaging results that were available during my care of the patient were reviewed by me and considered in my medical decision making (see chart for details).    Culture pending. Administered Solumedrol 80mg  IM.  Begin Clindamycin 300mg  TID, and Polytrim ophth. Suspension. Begin prednisone burst/taper Monday, 11/21/16. Continue to apply cool  compress several times daily.  May take Tylenol as needed for pain.  If symptoms become significantly worse during the night or over the weekend, proceed to the local emergency room.  Followup with ophthalmologist if not improved 3 days.     Final Clinical Impressions(s) / UC Diagnoses   Final diagnoses:  Other  conjunctivitis of left eye  Preseptal cellulitis of left eye    New Prescriptions New Prescriptions   CLINDAMYCIN (CLEOCIN) 300 MG CAPSULE    Take 1 capsule (300 mg total) by mouth 3 (three) times daily. (take every 8 hours)   PREDNISONE (DELTASONE) 20 MG TABLET    Take one tab by mouth twice daily for 4 days, then one daily for 3 days. Take with food.   TRIMETHOPRIM-POLYMYXIN B (POLYTRIM) OPHTHALMIC SOLUTION    Place 1 drop into the left eye every 4 (four) hours.     Kandra Nicolas, MD 11/26/16 [phone]

## 2016-11-20 NOTE — ED Triage Notes (Signed)
Patient reports onset of conjunctivitis left eye 7 days ago;  Her son had this and was diagnosed by opthamologist with viral pin eye. Reports itching, watering and thick discharge.

## 2016-11-22 ENCOUNTER — Telehealth: Payer: Self-pay | Admitting: *Deleted

## 2016-11-22 LAB — WOUND CULTURE
GRAM STAIN: NONE SEEN
Gram Stain: NONE SEEN
ORGANISM ID, BACTERIA: NO GROWTH

## 2016-11-22 NOTE — Telephone Encounter (Signed)
Spoke to pt given Wcx results. She reports that her eye is less swollen, but still red. She made an eye doctor appt for 11/24/16. Charna Archer, LPN

## 2016-11-24 DIAGNOSIS — B300 Keratoconjunctivitis due to adenovirus: Secondary | ICD-10-CM | POA: Diagnosis not present

## 2017-06-14 ENCOUNTER — Ambulatory Visit (INDEPENDENT_AMBULATORY_CARE_PROVIDER_SITE_OTHER): Payer: 59 | Admitting: Physician Assistant

## 2017-06-14 ENCOUNTER — Encounter: Payer: Self-pay | Admitting: Physician Assistant

## 2017-06-14 VITALS — BP 116/75 | HR 74 | Ht 68.0 in | Wt 146.0 lb

## 2017-06-14 DIAGNOSIS — M62838 Other muscle spasm: Secondary | ICD-10-CM

## 2017-06-14 DIAGNOSIS — I8390 Asymptomatic varicose veins of unspecified lower extremity: Secondary | ICD-10-CM | POA: Diagnosis not present

## 2017-06-14 DIAGNOSIS — M5030 Other cervical disc degeneration, unspecified cervical region: Secondary | ICD-10-CM

## 2017-06-14 DIAGNOSIS — Z13220 Encounter for screening for lipoid disorders: Secondary | ICD-10-CM

## 2017-06-14 DIAGNOSIS — M5020 Other cervical disc displacement, unspecified cervical region: Secondary | ICD-10-CM | POA: Diagnosis not present

## 2017-06-14 DIAGNOSIS — Z131 Encounter for screening for diabetes mellitus: Secondary | ICD-10-CM

## 2017-06-14 DIAGNOSIS — E559 Vitamin D deficiency, unspecified: Secondary | ICD-10-CM

## 2017-06-14 DIAGNOSIS — F5101 Primary insomnia: Secondary | ICD-10-CM

## 2017-06-14 MED ORDER — VITAMIN D (ERGOCALCIFEROL) 1.25 MG (50000 UNIT) PO CAPS: 50000.0000 [IU] | ORAL_CAPSULE | ORAL | 3 refills | Status: DC

## 2017-06-14 MED ORDER — CYCLOBENZAPRINE HCL 10 MG PO TABS: 10.0000 mg | ORAL_TABLET | Freq: Three times a day (TID) | ORAL | 1 refills | Status: DC | PRN

## 2017-06-14 MED ORDER — ESZOPICLONE 2 MG PO TABS
2.0000 mg | ORAL_TABLET | Freq: Every evening | ORAL | 5 refills | Status: DC | PRN
Start: 2017-06-14 — End: 2018-03-05
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
## 2017-06-15 MED FILL — VIT D2 1.25 MG (50,000 UNIT: 1.25 MG | 84 days supply | Qty: 12 | Fill #0

## 2017-06-15 MED FILL — CYCLOBENZAPRINE 10 MG TABLE: 10 | 10 days supply | Qty: 30 | Fill #0

## 2017-06-17 DIAGNOSIS — M5020 Other cervical disc displacement, unspecified cervical region: Secondary | ICD-10-CM | POA: Insufficient documentation

## 2017-06-17 DIAGNOSIS — F5101 Primary insomnia: Secondary | ICD-10-CM | POA: Insufficient documentation

## 2017-06-17 DIAGNOSIS — I8390 Asymptomatic varicose veins of unspecified lower extremity: Secondary | ICD-10-CM | POA: Insufficient documentation

## 2017-11-03 ENCOUNTER — Other Ambulatory Visit: Payer: Self-pay

## 2017-11-03 DIAGNOSIS — I83813 Varicose veins of bilateral lower extremities with pain: Secondary | ICD-10-CM

## 2017-11-08 ENCOUNTER — Encounter: Payer: 59 | Admitting: Vascular Surgery

## 2017-11-08 ENCOUNTER — Encounter (HOSPITAL_COMMUNITY): Payer: 59

## 2018-02-20 ENCOUNTER — Telehealth: Payer: Self-pay | Admitting: Physician Assistant

## 2018-02-20 DIAGNOSIS — Z1239 Encounter for other screening for malignant neoplasm of breast: Secondary | ICD-10-CM

## 2018-02-20 NOTE — Telephone Encounter (Signed)
Left VM with update that mammo has been ordered.

## 2018-02-20 NOTE — Telephone Encounter (Signed)
Pt called and scheduled her CPE for May 6 at 3:30 check in but she would like Jade to go ahead and order her mammo and see if we can get it scheduled for her the same time she is here? Please call patient and let her know status

## 2018-03-05 ENCOUNTER — Encounter: Payer: Self-pay | Admitting: Physician Assistant

## 2018-03-05 ENCOUNTER — Ambulatory Visit (INDEPENDENT_AMBULATORY_CARE_PROVIDER_SITE_OTHER): Payer: 59 | Admitting: Physician Assistant

## 2018-03-05 VITALS — BP 107/65 | HR 89 | Ht 68.0 in | Wt 157.0 lb

## 2018-03-05 DIAGNOSIS — F988 Other specified behavioral and emotional disorders with onset usually occurring in childhood and adolescence: Secondary | ICD-10-CM | POA: Diagnosis not present

## 2018-03-05 DIAGNOSIS — Z0000 Encounter for general adult medical examination without abnormal findings: Secondary | ICD-10-CM | POA: Diagnosis not present

## 2018-03-05 DIAGNOSIS — Z131 Encounter for screening for diabetes mellitus: Secondary | ICD-10-CM | POA: Diagnosis not present

## 2018-03-05 DIAGNOSIS — F5101 Primary insomnia: Secondary | ICD-10-CM | POA: Diagnosis not present

## 2018-03-05 DIAGNOSIS — Z13220 Encounter for screening for lipoid disorders: Secondary | ICD-10-CM | POA: Diagnosis not present

## 2018-03-05 DIAGNOSIS — Z1211 Encounter for screening for malignant neoplasm of colon: Secondary | ICD-10-CM

## 2018-03-05 DIAGNOSIS — R4789 Other speech disturbances: Secondary | ICD-10-CM

## 2018-03-05 DIAGNOSIS — I83813 Varicose veins of bilateral lower extremities with pain: Secondary | ICD-10-CM

## 2018-03-05 DIAGNOSIS — R413 Other amnesia: Secondary | ICD-10-CM | POA: Diagnosis not present

## 2018-03-05 MED ORDER — ESZOPICLONE 2 MG PO TABS: 2.0000 mg | ORAL_TABLET | Freq: Every evening | ORAL | 5 refills | Status: DC | PRN

## 2018-03-05 NOTE — Progress Notes (Deleted)
Some pain bilateral feels like squeezing. Compression. 25 years ago. Referral neuro  Vascule.

## 2018-03-05 NOTE — Progress Notes (Signed)
Subjective:     Jacqueline Hughes is a 52 y.o. female and is here for a comprehensive physical exam. The patient reports several complaints:  Varicose veins- she has multiple varicose veins bilaterally in her legs that cause her discomfort. 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 She had sclerotherapy for some of them about 10 years ago and is interested in having that procedure done again. She does not notice a lot of swelling but does occur time to time. She did try compression stockins 25 years ago but could not tolerate them very well.   Memory problems- she describes memory problems that have been ongoing now for quite some time. She is not currently having any problems at the time of the visit today. She mentioned these problems to a primary care doctor back in 2008, but they were never addressed. 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 She also describes episodes of slurring her speech. These episodes are not associated with consumption of alcohol, fatigue, or time of day. She has been able to correlate these cognitive impairments with her diet some because over the years she has gone on and off a ketogenic diet, and when she is off of the diet she seems to notice a worsening of her symptoms.   Insomnia- doing very well on the Angus, she reports only having to take it several times per month.  Social History   Socioeconomic History  . Marital status: Married    Spouse name: Not on file  . Number of children: Not on file  . Years of education: Not on file  . Highest education level: Not on file  Occupational History  . Not on file  Social Needs  . Financial resource strain: Not on file  . Food insecurity:    Worry: Not on file    Inability: Not on file  . Transportation needs:    Medical: Not on file    Non-medical: Not  on file  Tobacco Use  . Smoking status: Former Smoker    Last attempt to quit: 08/25/2004    Years since quitting: 13.5  . Smokeless tobacco: Never Used  Substance and Sexual Activity  . Alcohol use: Yes  . Drug use: No  . Sexual activity: Yes    Partners: Male  Lifestyle  . Physical activity:    Days per week: Not on file    Minutes per session: Not on file  . Stress: Not on file  Relationships  . Social connections:    Talks on phone: Not on file    Gets together: Not on file    Attends religious service: Not on file    Active member of club or organization: Not on file    Attends meetings of clubs or organizations: Not on file    Relationship status: Not on file  . Intimate partner violence:    Fear of current or ex partner: Not on file    Emotionally abused: Not on file    Physically abused: Not on file    Forced sexual activity: Not on file  Other Topics Concern  . Not on file  Social History Narrative  . Not on file   Health Maintenance  Topic Date Due  . COLONOSCOPY  05/08/2017  . HIV Screening  03/06/2019 (Originally 05/08/1982)  .  PAP SMEAR  09/16/2036 (Originally 05/08/1988)  . INFLUENZA VACCINE  05/31/2018  . MAMMOGRAM  08/30/2018  . TETANUS/TDAP  08/30/2026    Review of Systems Pertinent items noted in HPI and remainder of comprehensive ROS otherwise negative.   Objective:    General appearance: alert, cooperative, appears stated age and no distress Head: Normocephalic, without obvious abnormality, atraumatic Eyes: conjunctivae/corneas clear. PERRL, EOM's intact. Fundi benign. Ears: normal TM's and external ear canals both ears Throat: lips, mucosa, and tongue normal; teeth and gums normal Neck: no adenopathy, no JVD, supple, symmetrical, trachea midline and thyroid not enlarged, symmetric, no tenderness/mass/nodules Lungs: clear to auscultation bilaterally Heart: regular rate and rhythm, S1, S2 normal, no murmur, click, rub or gallop Abdomen: soft,  non-tender; bowel sounds normal; no masses,  no organomegaly Extremities: extremities normal, atraumatic, no cyanosis or edema Pulses: 2+ and symmetric   bilateral legs varicose veins more distributed on upper thighs than lower legs.  Assessment:    Healthy female exam.   Plan:  Jacqueline KitchenMarland KitchenFaith was seen today for annual exam.  Diagnoses and all orders for this visit:  Routine physical examination -     B12 and Folate Panel -     Vitamin D 1,25 dihydroxy -     CBC with Differential/Platelet -     Lipid Panel w/reflex Direct LDL -     COMPLETE METABOLIC PANEL WITH GFR -     TSH -     Sed Rate (ESR) -     C-reactive protein  Primary insomnia -     Discontinue: eszopiclone (LUNESTA) 2 MG TABS tablet; Take 1 tablet (2 mg total) by mouth at bedtime as needed for sleep. Take immediately before bedtime -     eszopiclone (LUNESTA) 2 MG TABS tablet; Take 1 tablet (2 mg total) by mouth at bedtime as needed for sleep. Take immediately before bedtime  Attention deficit disorder (ADD) without hyperactivity -     Ambulatory referral to Neurology  Colon cancer screening -     Ambulatory referral to Gastroenterology  Word finding difficulty -     B12 and Folate Panel -     Vitamin D 1,25 dihydroxy -     CBC with Differential/Platelet -     TSH -     Sed Rate (ESR) -     C-reactive protein -     Ambulatory referral to Neurology  Memory changes -     B12 and Folate Panel -     Vitamin D 1,25 dihydroxy -     CBC with Differential/Platelet -     TSH -     Sed Rate (ESR) -     C-reactive protein -     Ambulatory referral to Neurology  Screening for diabetes mellitus -     COMPLETE METABOLIC PANEL WITH GFR  Screening for lipid disorders -     Lipid Panel w/reflex Direct LDL  Varicose veins of both lower extremities with pain -     Ambulatory referral to Vascular Surgery  .Jacqueline Hughes Depression screen [facility] 2/9 06/14/2017 09/16/2016  Decreased Interest 0 1  Down, Depressed, Hopeless 1 1  PHQ - 2  Score 1 2  Altered sleeping - 0  Tired, decreased energy - 1  Change in appetite - 0  Feeling bad or failure about yourself  - 1  Trouble concentrating - 0  Moving slowly or fidgety/restless - 0  Suicidal thoughts - 0  PHQ-9 Score - 4   .Jacqueline Hughes 6CIT Screen  03/05/2018  What Year? 0 points  What month? 0 points  What time? 0 points  Count back from 20 0 points  Months in reverse 0 points  Repeat phrase 2 points  Total Score 2   .. Discussed 150 minutes of exercise a week.  Encouraged vitamin D 1000 units and Calcium 1384m or 4 servings of dairy a day.  Handout about Shingrix vaccine given to patient.  Mammogram scheduled.  Colonoscopy scheduled.  Fasting labs ordered.   Made referral for varicose veins. Discussed importance of compression stockings at least 266mg.   Encouraged by normal 6CIT. Will get labs to look for any metabolic reason for cognition to be declined. I certainly think some of her symptoms could be some uncontrolled ADHD. I am sending her to neurology to further work this up. She would like to discuss correlation to keto diet and cognition.   .    See After Visit Summary for Counseling Recommendations

## 2018-03-05 NOTE — Patient Instructions (Signed)

## 2018-03-07 ENCOUNTER — Encounter: Payer: Self-pay | Admitting: Physician Assistant

## 2018-03-07 ENCOUNTER — Ambulatory Visit (INDEPENDENT_AMBULATORY_CARE_PROVIDER_SITE_OTHER): Payer: 59

## 2018-03-07 DIAGNOSIS — Z1231 Encounter for screening mammogram for malignant neoplasm of breast: Secondary | ICD-10-CM | POA: Diagnosis not present

## 2018-03-07 DIAGNOSIS — E7800 Pure hypercholesterolemia, unspecified: Secondary | ICD-10-CM | POA: Insufficient documentation

## 2018-03-07 DIAGNOSIS — Z1239 Encounter for other screening for malignant neoplasm of breast: Secondary | ICD-10-CM

## 2018-03-07 NOTE — Progress Notes (Signed)
Call pt: normal mammogram. Follow up in 1 year.

## 2018-03-07 NOTE — Progress Notes (Signed)
Call pt: elevated bad cholesterol but great good cholesterol. Overall CV risk .9 percent and low. No need for medication at this point but continue to watch fats in diet. Kidney, liver, glucose look good. Thyroid looks good. Inflammation low in body which is great.

## 2018-03-08 LAB — COMPLETE METABOLIC PANEL WITH GFR
AG RATIO: 1.7 (calc) (ref 1.0–2.5)
ALKALINE PHOSPHATASE (APISO): 49 U/L (ref 33–130)
ALT: 14 U/L (ref 6–29)
AST: 21 U/L (ref 10–35)
Albumin: 4.5 g/dL (ref 3.6–5.1)
BILIRUBIN TOTAL: 0.3 mg/dL (ref 0.2–1.2)
BUN: 14 mg/dL (ref 7–25)
CHLORIDE: 103 mmol/L (ref 98–110)
CO2: 27 mmol/L (ref 20–32)
Calcium: 9.1 mg/dL (ref 8.6–10.4)
Creat: 0.8 mg/dL (ref 0.50–1.05)
GFR, Est African American: 100 mL/min/{1.73_m2} (ref >=60)
GFR, Est Non African American: 86 mL/min/{1.73_m2} (ref >=60)
GLOBULIN: 2.6 g/dL (ref 1.9–3.7)
Glucose, Bld: 85 mg/dL (ref 65–99)
Potassium: 4 mmol/L (ref 3.5–5.3)
SODIUM: 140 mmol/L (ref 135–146)
Total Protein: 7.1 g/dL (ref 6.1–8.1)

## 2018-03-08 LAB — CBC WITH DIFFERENTIAL/PLATELET
BASOS PCT: 0.7 %
Basophils Absolute: 58 cells/uL (ref 0–200)
EOS PCT: 1.6 %
Eosinophils Absolute: 133 cells/uL (ref 15–500)
HEMATOCRIT: 39.9 % (ref 35.0–45.0)
HEMOGLOBIN: 13.8 g/dL (ref 11.7–15.5)
LYMPHS ABS: 1901 {cells}/uL (ref 850–3900)
MCH: 29.7 pg (ref 27.0–33.0)
MCHC: 34.6 g/dL (ref 32.0–36.0)
MCV: 86 fL (ref 80.0–100.0)
MONOS PCT: 8.1 %
MPV: 9.7 fL (ref 7.5–12.5)
NEUTROS ABS: 5536 {cells}/uL (ref 1500–7800)
Neutrophils Relative %: 66.7 %
Platelets: 349 10*3/uL (ref 140–400)
RBC: 4.64 10*6/uL (ref 3.80–5.10)
RDW: 12.7 % (ref 11.0–15.0)
Total Lymphocyte: 22.9 %
WBC mixed population: 672 cells/uL (ref 200–950)
WBC: 8.3 10*3/uL (ref 3.8–10.8)

## 2018-03-08 LAB — B12 AND FOLATE PANEL
FOLATE: 15.7 ng/mL
VITAMIN B 12: 340 pg/mL (ref 200–1100)

## 2018-03-08 LAB — LIPID PANEL W/REFLEX DIRECT LDL
CHOL/HDL RATIO: 3.7 (calc) (ref <5.0)
CHOLESTEROL: 218 mg/dL — AB (ref <200)
HDL: 59 mg/dL (ref >50)
LDL CHOLESTEROL (CALC): 140 mg/dL — AB
NON-HDL CHOLESTEROL (CALC): 159 mg/dL — AB (ref <130)
TRIGLYCERIDES: 87 mg/dL (ref <150)

## 2018-03-08 LAB — VITAMIN D 1,25 DIHYDROXY
Vitamin D 1, 25 (OH)2 Total: 46 pg/mL (ref 18–72)
Vitamin D2 1, 25 (OH)2: 16 pg/mL
Vitamin D3 1, 25 (OH)2: 30 pg/mL

## 2018-03-08 LAB — C-REACTIVE PROTEIN: CRP: 1.7 mg/L (ref <8.0)

## 2018-03-08 LAB — TSH: TSH: 2.2 mIU/L

## 2018-03-08 LAB — SEDIMENTATION RATE: SED RATE: 2 mm/h (ref 0–20)

## 2018-04-04 ENCOUNTER — Other Ambulatory Visit: Payer: Self-pay

## 2018-04-04 DIAGNOSIS — I83813 Varicose veins of bilateral lower extremities with pain: Secondary | ICD-10-CM

## 2018-04-20 ENCOUNTER — Encounter: Payer: 59 | Admitting: Vascular Surgery

## 2018-04-20 ENCOUNTER — Encounter (HOSPITAL_COMMUNITY): Payer: 59

## 2018-05-22 ENCOUNTER — Ambulatory Visit: Payer: Self-pay | Admitting: Diagnostic Neuroimaging

## 2018-06-11 ENCOUNTER — Ambulatory Visit (INDEPENDENT_AMBULATORY_CARE_PROVIDER_SITE_OTHER): Payer: 59 | Admitting: Physician Assistant

## 2018-06-11 ENCOUNTER — Encounter: Payer: Self-pay | Admitting: Physician Assistant

## 2018-06-11 VITALS — BP 121/77 | HR 70 | Ht 68.0 in | Wt 164.0 lb

## 2018-06-11 DIAGNOSIS — F5101 Primary insomnia: Secondary | ICD-10-CM | POA: Diagnosis not present

## 2018-06-11 DIAGNOSIS — F329 Major depressive disorder, single episode, unspecified: Secondary | ICD-10-CM | POA: Diagnosis not present

## 2018-06-11 DIAGNOSIS — R413 Other amnesia: Secondary | ICD-10-CM | POA: Diagnosis not present

## 2018-06-11 DIAGNOSIS — F909 Attention-deficit hyperactivity disorder, unspecified type: Secondary | ICD-10-CM | POA: Diagnosis not present

## 2018-06-11 DIAGNOSIS — F430 Acute stress reaction: Secondary | ICD-10-CM | POA: Diagnosis not present

## 2018-06-11 MED ORDER — ESCITALOPRAM OXALATE 10 MG PO TABS: 10.0000 mg | ORAL_TABLET | Freq: Every day | ORAL | 1 refills | Status: DC

## 2018-06-11 MED ORDER — ALPRAZOLAM 0.5 MG PO TABS: 0.5000 mg | ORAL_TABLET | Freq: Every evening | ORAL | 1 refills | Status: DC | PRN

## 2018-06-11 MED ORDER — AMPHETAMINE-DEXTROAMPHET ER 10 MG PO CP24: 10.0000 mg | ORAL_CAPSULE | Freq: Every day | ORAL | 0 refills | Status: DC

## 2018-06-11 MED FILL — ESCITALOPRAM 10 MG TABLET: 10 | 30 days supply | Qty: 30 | Fill #0

## 2018-06-11 MED FILL — ALPRAZolam 0.5 MG TABS: 0.5 | 20 days supply | Qty: 20 | Fill #0

## 2018-06-11 NOTE — Progress Notes (Signed)
Subjective:    Patient ID: Jacqueline Hughes, female    DOB: [birth year], 53 y.o.   MRN: [id number]  HPI Pt is a 52 yo female who presents to the clinic to follow up on anxiety, ADHD, insomnia.  Patient has had a very traumatic couple months.  Her daughter was pregnant and birth a full-term child that was stillborn.  She has been working through this but is been very hard.  Her daughter lives in [place].  She is spent much of her time in [place].  She has had some grief counseling at church.  She does finds herself very tearful and very anxious.  She finds herself worrying about unrealistic things.  She is having trouble sleeping.  Lunesta does not seem to help.  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  She denies any suicidal or homicidal thoughts.  She is most worried about not being able to see her daughter regularly as work and travel distance keeps them apart.  Patient was already having some cognitive memory and word finding issues.  This is certainly been made worse by her recent trauma.  She had a neurologist appointment but she canceled because she figured she needs to work through this before they can look for other causes for her cognition issues. .. Active Ambulatory Problems    Diagnosis Date Noted  . Adult ADHD 08/25/2014  . Depression 08/25/2014  . Vitamin D deficiency 08/25/2014  . Dyslexia 08/25/2014  . Food intolerance 08/29/2014  . Osteopenia 08/16/2015  . Recurrent cold sores 01/20/2016  . Varicose vein of leg 06/17/2017  . Primary insomnia 06/17/2017  . Cervical herniated disc 06/17/2017  . ADD (attention deficit disorder) 03/05/2018  . Memory changes 03/05/2018  . Word finding difficulty 03/05/2018  . Elevated LDL cholesterol level 03/07/2018   Resolved Ambulatory Problems    Diagnosis Date Noted  . No Resolved Ambulatory Problems   Past Medical History:  Diagnosis Date  . Anxiety   .  Hyperlipidemia   . Thyroid disease       Review of Systems  All other systems reviewed and are negative.      Objective:   Physical Exam  Constitutional: She is oriented to person, place, and time. She appears well-developed and well-nourished.  HENT:  Head: Normocephalic and atraumatic.  Cardiovascular: Normal rate and regular rhythm.  Pulmonary/Chest: Effort normal.  Neurological: She is alert and oriented to person, place, and time. Coordination normal.  Psychiatric: She has a normal mood and affect. Her behavior is normal.          Assessment & Plan:  Marland KitchenMarland KitchenDiagnoses and all orders for this visit:  Acute stress reaction -     ALPRAZolam (XANAX) 0.5 MG tablet; Take 1 tablet (0.5 mg total) by mouth at bedtime as needed for anxiety.  Adult ADHD -     amphetamine-dextroamphetamine (ADDERALL XR) 10 MG 24 hr capsule; Take 1 capsule (10 mg total) by mouth daily.  Reactive depression -     escitalopram (LEXAPRO) 10 MG tablet; Take 1 tablet (10 mg total) by mouth daily.  Memory changes  Primary insomnia   .[name] Depression screen [facility] 2/9 06/11/2018 06/14/2017 09/16/2016  Decreased Interest 1 0 1  Down, Depressed, Hopeless 1 1 1   PHQ - 2 Score 2 1 2   Altered sleeping 3 - 0  Tired, decreased energy 2 - 1  Change in  appetite 3 - 0  Feeling bad or failure about yourself  0 - 1  Trouble concentrating 2 - 0  Moving slowly or fidgety/restless 2 - 0  Suicidal thoughts 0 - 0  PHQ-9 Score 14 - 4   .[name] GAD 7 : Generalized Anxiety Score 06/11/2018  Nervous, Anxious, on Edge 3  Control/stop worrying 3  Worry too much - different things 3  Trouble relaxing 3  Restless 3  Easily annoyed or irritable 2  Afraid - awful might happen 2  Total GAD 7 Score 19    Certainly I feel like her anxiety, depression, mood, stress reaction is causing her focus to be worse.  She clearly already has underlying ADHD issues.  Will start Lexapro.  Discussed side effects.  She is aware this could take  a few weeks to fully work.  I did give her a few Xanax to use as needed.  I did discuss the dependency abuse potential of this drug.  She is aware to only use as needed.  She is also aware to not use it nightly for sleep aid.  We did discuss CBD oil for sleep as well as over-the-counter Unisom.  Strongly encouraged her to continue with any grief counseling and working through this with her daughter.  I do think FMLA paperwork for intermittent days a month to potentially travel to be with her daughter could be helpful.  I agreed to do this as patient sees fit or needed.  I am concerned stimulant could worsen anxiety or irritability.  I did give her 1 month that she can try with the start of school.  She is aware of this medicine potentially making symptoms worse.  Follow-up in 6 weeks.  [name].Spent 30 minutes with patient and greater than 50 percent of visit spent counseling patient regarding treatment plan.

## 2018-06-11 NOTE — Patient Instructions (Signed)
Stress and Stress Management Stress is a normal reaction to life events. It is what you feel when life demands more than you are used to or more than you can handle. Some stress can be useful. For example, the stress reaction can help you catch the last bus of the day, study for a test, or meet a deadline at work. But stress that occurs too often or for too long can cause problems. It can affect your emotional health and interfere with relationships and normal daily activities. Too much stress can weaken your immune system and increase your risk for physical illness. If you already have a medical problem, stress can make it worse. What are the causes? All sorts of life events may cause stress. An event that causes stress for one person may not be stressful for another person. Major life events commonly cause stress. These may be positive or negative. Examples include losing your job, moving into a new home, getting married, having a baby, or losing a loved one. Less obvious life events may also cause stress, especially if they occur day after day or in combination. Examples include working long hours, driving in traffic, caring for children, being in debt, or being in a difficult relationship. What are the signs or symptoms? Stress may cause emotional symptoms including, the following:  Anxiety. This is feeling worried, afraid, on edge, overwhelmed, or out of control.  Anger. This is feeling irritated or impatient.  Depression. This is feeling sad, down, helpless, or guilty.  Difficulty focusing, remembering, or making decisions.  Stress may cause physical symptoms, including the following:  Aches and pains. These may affect your head, neck, back, stomach, or other areas of your body.  Tight muscles or clenched jaw.  Low energy or trouble sleeping.  Stress may cause unhealthy behaviors, including the following:  Eating to feel better (overeating) or skipping meals.  Sleeping too little,  too much, or both.  Working too much or putting off tasks (procrastination).  Smoking, drinking alcohol, or using drugs to feel better.  How is this diagnosed? Stress is diagnosed through an assessment by your health care provider. Your health care provider will ask questions about your symptoms and any stressful life events.Your health care provider will also ask about your medical history and may order blood tests or other tests. Certain medical conditions and medicine can cause physical symptoms similar to stress. Mental illness can cause emotional symptoms and unhealthy behaviors similar to stress. Your health care provider may refer you to a mental health professional for further evaluation. How is this treated? Stress management is the recommended treatment for stress.The goals of stress management are reducing stressful life events and coping with stress in healthy ways. Techniques for reducing stressful life events include the following:  Stress identification. Self-monitor for stress and identify what causes stress for you. These skills may help you to avoid some stressful events.  Time management. Set your priorities, keep a calendar of events, and learn to say "no." These tools can help you avoid making too many commitments.  Techniques for coping with stress include the following:  Rethinking the problem. Try to think realistically about stressful events rather than ignoring them or overreacting. Try to find the positives in a stressful situation rather than focusing on the negatives.  Exercise. Physical exercise can release both physical and emotional tension. The key is to find a form of exercise you enjoy and do it regularly.  Relaxation techniques. These relax the body and  mind. Examples include yoga, meditation, tai chi, biofeedback, deep breathing, progressive muscle relaxation, listening to music, being out in nature, journaling, and other hobbies. Again, the key is to find  one or more that you enjoy and can do regularly.  Healthy lifestyle. Eat a balanced diet, get plenty of sleep, and do not smoke. Avoid using alcohol or drugs to relax.  Strong support network. Spend time with family, friends, or other people you enjoy being around.Express your feelings and talk things over with someone you trust.  Counseling or talktherapy with a mental health professional may be helpful if you are having difficulty managing stress on your own. Medicine is typically not recommended for the treatment of stress.Talk to your health care provider if you think you need medicine for symptoms of stress. Follow these instructions at home:  Keep all follow-up visits as directed by your health care provider.  Take all medicines as directed by your health care provider. Contact a health care provider if:  Your symptoms get worse or you start having new symptoms.  You feel overwhelmed by your problems and can no longer manage them on your own. Get help right away if:  You feel like hurting yourself or someone else. This information is not intended to replace advice given to you by your health care provider. Make sure you discuss any questions you have with your health care provider. Document Released: 04/12/2001 Document Revised: 03/24/2016 Document Reviewed: 06/11/2013 Elsevier Interactive Patient Education  2017 [employer]  

## 2018-06-13 MED FILL — ADDERALL XR 10 MG CAP SA: 10 | 30 days supply | Qty: 30 | Fill #0

## 2018-06-15 ENCOUNTER — Inpatient Hospital Stay (HOSPITAL_COMMUNITY): Admission: RE | Admit: 2018-06-15 | Payer: 59 | Source: Ambulatory Visit

## 2018-06-15 ENCOUNTER — Encounter: Payer: 59 | Admitting: Vascular Surgery

## 2018-07-12 ENCOUNTER — Other Ambulatory Visit: Payer: Self-pay | Admitting: Physician Assistant

## 2018-07-12 DIAGNOSIS — E559 Vitamin D deficiency, unspecified: Secondary | ICD-10-CM

## 2018-07-12 MED FILL — VIT D2 1.25 MG (50,000 UNIT: 1.25 MG | 84 days supply | Qty: 12 | Fill #0

## 2018-07-21 ENCOUNTER — Other Ambulatory Visit: Payer: Self-pay | Admitting: Physician Assistant

## 2018-07-21 DIAGNOSIS — F909 Attention-deficit hyperactivity disorder, unspecified type: Secondary | ICD-10-CM

## 2018-07-23 NOTE — Telephone Encounter (Signed)
My last note want to make sure the stimulant did not worsen anxiety. I have not heard any follow up from patient. Can we call to find out?

## 2018-07-24 ENCOUNTER — Ambulatory Visit: Payer: 59 | Admitting: Physician Assistant

## 2018-07-24 MED ORDER — AMPHETAMINE-DEXTROAMPHET ER 10 MG PO CP24: 10.0000 mg | ORAL_CAPSULE | Freq: Every day | ORAL | 0 refills | Status: DC

## 2018-07-26 ENCOUNTER — Telehealth: Payer: Self-pay | Admitting: Physician Assistant

## 2018-07-26 DIAGNOSIS — F909 Attention-deficit hyperactivity disorder, unspecified type: Secondary | ICD-10-CM

## 2018-07-26 MED ORDER — AMPHETAMINE-DEXTROAMPHET ER 10 MG PO CP24
10.0000 mg | ORAL_CAPSULE | Freq: Every day | ORAL | 0 refills | Status: DC
Start: 2018-07-26 — End: 2018-08-02

## 2018-07-26 MED FILL — ADDERALL XR 10 MG CAP SA: 10 | 30 days supply | Qty: 30 | Fill #0

## 2018-07-26 NOTE — Telephone Encounter (Signed)
Had to cancel appt. Refilled for one month.

## 2018-07-31 ENCOUNTER — Encounter: Payer: Self-pay | Admitting: Physician Assistant

## 2018-07-31 ENCOUNTER — Ambulatory Visit (INDEPENDENT_AMBULATORY_CARE_PROVIDER_SITE_OTHER): Payer: 59 | Admitting: Physician Assistant

## 2018-07-31 VITALS — BP 110/73 | HR 83 | Temp 98.9°F | Ht 68.0 in | Wt 153.0 lb

## 2018-07-31 DIAGNOSIS — F321 Major depressive disorder, single episode, moderate: Secondary | ICD-10-CM | POA: Diagnosis not present

## 2018-07-31 DIAGNOSIS — F4329 Adjustment disorder with other symptoms: Secondary | ICD-10-CM

## 2018-07-31 DIAGNOSIS — F4381 Prolonged grief disorder: Secondary | ICD-10-CM

## 2018-07-31 DIAGNOSIS — F4321 Adjustment disorder with depressed mood: Secondary | ICD-10-CM

## 2018-07-31 DIAGNOSIS — F909 Attention-deficit hyperactivity disorder, unspecified type: Secondary | ICD-10-CM | POA: Diagnosis not present

## 2018-07-31 MED ORDER — FLUOXETINE HCL 20 MG PO TABS: 20.0000 mg | ORAL_TABLET | Freq: Every day | ORAL | 2 refills | Status: DC

## 2018-07-31 MED FILL — FLUoxetine HCL 20 MG TABS: 20 | 30 days supply | Qty: 30 | Fill #0

## 2018-07-31 NOTE — Progress Notes (Signed)
Subjective:    Patient ID: Jacqueline Hughes, female    DOB: 19-Jan-1966, 52 y.o.   MRN: [id number]  HPI  Pt is a 52 yo female with ADD, grief, depression who presents to the clinic to follow up on grief and depression.   She was not able to tolerate lexapro it made her feel confused and she had some memory loss. She continues to struggle with depression. She constantly thinks about her daughters lost child. She is very emotional. She doesn't have a lot of motivation. She continues to work as a Pharmacist, hospital. Adding back the adderall has helped a lot. She has not used xanax. She had an incident at work where another Pharmacist, hospital called her out in front of students today and she lost it. She told the administrators she could not come back today and maybe not tomorrow. She has cried all afternoon. She does not feel supported. She feels attacked by [employer]. She is aware that it is probably her depression/grief that is causing her not to be able to handle this situation. Her daughter who had a still born in may has had another miscarriage. Her other daughter is pregnant again but not taking care of the daughter she has. She denies any SI/HC. She is worried about her job.      .. Active Ambulatory Problems    Diagnosis Date Noted  . Adult ADHD 08/25/2014  . Depression 08/25/2014  . Vitamin D deficiency 08/25/2014  . Dyslexia 08/25/2014  . Food intolerance 08/29/2014  . Osteopenia 08/16/2015  . Recurrent cold sores 01/20/2016  . Varicose vein of leg 06/17/2017  . Primary insomnia 06/17/2017  . Cervical herniated disc 06/17/2017  . ADD (attention deficit disorder) 03/05/2018  . Memory changes 03/05/2018  . Word finding difficulty 03/05/2018  . Elevated LDL cholesterol level 03/07/2018  . Depression, major, single episode, moderate ([place]) 08/02/2018  . Grief reaction with prolonged bereavement 08/02/2018   Resolved Ambulatory Problems    Diagnosis Date Noted  . No Resolved Ambulatory Problems   Past  Medical History:  Diagnosis Date  . Anxiety   . Hyperlipidemia   . Thyroid disease       Review of Systems  All other systems reviewed and are negative.      Objective:   Physical Exam  Constitutional: She is oriented to person, place, and time. She appears well-developed and well-nourished.  HENT:  Head: Normocephalic and atraumatic.  Cardiovascular: Normal rate and regular rhythm.  Neurological: She is alert and oriented to person, place, and time.  Skin: No rash noted.  Psychiatric:  Emotional, tearful.            Assessment & Plan:  Marland KitchenMarland KitchenDiagnoses and all orders for this visit:  Grief reaction with prolonged bereavement -     FLUoxetine (PROZAC) 20 MG tablet; Take 1 tablet (20 mg total) by mouth daily. -     Ambulatory referral to Psychology  Depression, major, single episode, moderate (HCC) -     FLUoxetine (PROZAC) 20 MG tablet; Take 1 tablet (20 mg total) by mouth daily. -     Ambulatory referral to Psychology  Adult ADHD -     amphetamine-dextroamphetamine (ADDERALL XR) 10 MG 24 hr capsule; Take 1 capsule (10 mg total) by mouth daily. -     amphetamine-dextroamphetamine (ADDERALL XR) 10 MG 24 hr capsule; Take 1 capsule (10 mg total) by mouth daily. -     amphetamine-dextroamphetamine (ADDERALL XR) 10 MG 24 hr capsule; Take 1 capsule (  10 mg total) by mouth daily.   .. Depression screen [facility] 2/9 07/31/2018 06/11/2018 06/14/2017 09/16/2016  Decreased Interest 2 1 0 1  Down, Depressed, Hopeless 3 1 1 1   PHQ - 2 Score 5 2 1 2   Altered sleeping 2 3 - 0  Tired, decreased energy 1 2 - 1  Change in appetite 0 3 - 0  Feeling bad or failure about yourself  1 0 - 1  Trouble concentrating 2 2 - 0  Moving slowly or fidgety/restless 1 2 - 0  Suicidal thoughts 0 0 - 0  PHQ-9 Score 12 14 - 4  Difficult doing work/chores Very difficult - - -   .[name] GAD 7 : Generalized Anxiety Score 07/31/2018 06/11/2018  Nervous, Anxious, on Edge 1 3  Control/stop worrying 1 3  Worry too  much - different things 0 3  Trouble relaxing 1 3  Restless 1 3  Easily annoyed or irritable 3 2  Afraid - awful might happen 0 2  Total GAD 7 Score 7 19  Anxiety Difficulty Somewhat difficult -   Started on prozac.she has been on this in the past. SE discussed.  Discussed could take some time to see benefit. Referred for counseling and sTRONGLY suggested this. Written out of work for the rest of the week. Discussed options how to handle work conflict. She will meet with administration later this week.   Refilled adderall for 3 months.   [name].Spent 30 minutes with patient and greater than 50 percent of visit spent counseling patient regarding treatment plan.

## 2018-08-01 ENCOUNTER — Encounter: Payer: Self-pay | Admitting: Physician Assistant

## 2018-08-02 DIAGNOSIS — F4321 Adjustment disorder with depressed mood: Secondary | ICD-10-CM | POA: Insufficient documentation

## 2018-08-02 DIAGNOSIS — F4381 Prolonged grief disorder: Secondary | ICD-10-CM | POA: Insufficient documentation

## 2018-08-02 DIAGNOSIS — F321 Major depressive disorder, single episode, moderate: Secondary | ICD-10-CM | POA: Insufficient documentation

## 2018-08-02 MED ORDER — AMPHETAMINE-DEXTROAMPHET ER 10 MG PO CP24: 10.0000 mg | ORAL_CAPSULE | Freq: Every day | ORAL | 0 refills | Status: DC

## 2018-08-02 NOTE — Telephone Encounter (Signed)
She was labeled no show on 9/26. She stated she called and took a wrong turn and could not make appt on time. She doesn't want it listed as no show. Can we change this?

## 2018-08-06 ENCOUNTER — Ambulatory Visit (INDEPENDENT_AMBULATORY_CARE_PROVIDER_SITE_OTHER): Payer: 59 | Admitting: Physician Assistant

## 2018-08-06 ENCOUNTER — Encounter: Payer: Self-pay | Admitting: Physician Assistant

## 2018-08-06 VITALS — BP 112/70 | HR 75 | Ht 68.0 in | Wt 152.0 lb

## 2018-08-06 DIAGNOSIS — F321 Major depressive disorder, single episode, moderate: Secondary | ICD-10-CM

## 2018-08-06 DIAGNOSIS — Z566 Other physical and mental strain related to work: Secondary | ICD-10-CM

## 2018-08-06 DIAGNOSIS — F4321 Adjustment disorder with depressed mood: Secondary | ICD-10-CM | POA: Diagnosis not present

## 2018-08-06 DIAGNOSIS — F4381 Prolonged grief disorder: Secondary | ICD-10-CM

## 2018-08-06 DIAGNOSIS — Z23 Encounter for immunization: Secondary | ICD-10-CM

## 2018-08-06 NOTE — Progress Notes (Signed)
   Subjective:    Patient ID: Jacqueline Hughes, female    DOB: 05/31/1966, 52 y.o.   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 Wrote her out for end of last week and Monday of this week. She had mtg with administration today and went well. She does feel supported and ready to go back to work. She continues to struggle with greiving and family stress but feels a little more stable. She has taken a few xanax and started prozac.   .. Active Ambulatory Problems    Diagnosis Date Noted  . Adult ADHD 08/25/2014  . Depression 08/25/2014  . Vitamin D deficiency 08/25/2014  . Dyslexia 08/25/2014  . Food intolerance 08/29/2014  . Osteopenia 08/16/2015  . Recurrent cold sores 01/20/2016  . Varicose vein of leg 06/17/2017  . Primary insomnia 06/17/2017  . Cervical herniated disc 06/17/2017  . ADD (attention deficit disorder) 03/05/2018  . Memory changes 03/05/2018  . Word finding difficulty 03/05/2018  . Elevated LDL cholesterol level 03/07/2018  . Depression, major, single episode, moderate ([place]) 08/02/2018  . Grief reaction with prolonged bereavement 08/02/2018  . Stress at work 08/07/2018   Resolved Ambulatory Problems    Diagnosis Date Noted  . No Resolved Ambulatory Problems   Past Medical History:  Diagnosis Date  . Anxiety   . Hyperlipidemia   . Thyroid disease     Review of Systems  All other systems reviewed and are negative.      Objective:   Physical Exam  Constitutional: She appears well-developed and well-nourished.  Psychiatric: She has a normal mood and affect. Her behavior is normal.          Assessment & Plan:  Marland KitchenMarland KitchenDiagnoses and all orders for this visit:  Depression, major, single episode, moderate ([place])  Need for immunization against influenza -     Flu Vaccine QUAD 36+ mos IM  Grief  reaction with prolonged bereavement  Stress at work   Pt ok to go back to work full duty no restrictions. Continue prozac. Strongly encouraged counseling.   [name].Spent 30 minutes with patient and greater than 50 percent of visit spent counseling patient regarding treatment plan.

## 2018-08-07 ENCOUNTER — Encounter: Payer: Self-pay | Admitting: Physician Assistant

## 2018-08-07 DIAGNOSIS — Z566 Other physical and mental strain related to work: Secondary | ICD-10-CM | POA: Insufficient documentation

## 2018-09-06 ENCOUNTER — Other Ambulatory Visit: Payer: Self-pay | Admitting: Physician Assistant

## 2018-09-06 DIAGNOSIS — F909 Attention-deficit hyperactivity disorder, unspecified type: Secondary | ICD-10-CM

## 2018-09-14 ENCOUNTER — Encounter: Payer: Self-pay | Admitting: Physician Assistant

## 2018-09-17 MED FILL — ADDERALL XR 10 MG CAP SA: 10 | 30 days supply | Qty: 30 | Fill #0

## 2018-09-19 MED FILL — FLUoxetine HCL 20 MG TABS: 20 | 30 days supply | Qty: 30 | Fill #1

## 2018-11-20 MED FILL — ADDERALL XR 10 MG CAP SA: 10 | 30 days supply | Qty: 30 | Fill #0

## 2018-11-20 MED FILL — FLUoxetine HCL 20 MG TABS: 20 | 30 days supply | Qty: 30 | Fill #2

## 2019-01-16 ENCOUNTER — Encounter: Payer: Self-pay | Admitting: Physician Assistant

## 2019-01-16 ENCOUNTER — Other Ambulatory Visit: Payer: Self-pay | Admitting: Physician Assistant

## 2019-01-16 DIAGNOSIS — F909 Attention-deficit hyperactivity disorder, unspecified type: Secondary | ICD-10-CM

## 2019-01-16 MED ORDER — AMPHETAMINE-DEXTROAMPHET ER 10 MG PO CP24: 10.0000 mg | ORAL_CAPSULE | Freq: Every day | ORAL | 0 refills | Status: DC

## 2019-01-16 MED FILL — ADDERALL XR 10 MG CAP SA: 10 | 30 days supply | Qty: 30 | Fill #0

## 2019-01-16 NOTE — Telephone Encounter (Signed)
Needs appt. Refill for this month only.

## 2019-01-17 ENCOUNTER — Other Ambulatory Visit: Payer: Self-pay | Admitting: Physician Assistant

## 2019-01-17 DIAGNOSIS — F321 Major depressive disorder, single episode, moderate: Secondary | ICD-10-CM

## 2019-01-17 DIAGNOSIS — F4321 Adjustment disorder with depressed mood: Secondary | ICD-10-CM

## 2019-01-17 DIAGNOSIS — F4381 Prolonged grief disorder: Secondary | ICD-10-CM

## 2019-01-17 MED FILL — FLUoxetine HCL 20 MG TABS: 20 | 30 days supply | Qty: 30 | Fill #0

## 2019-01-17 NOTE — Telephone Encounter (Signed)
MyChart message sent advising she is due for follow up

## 2019-03-13 ENCOUNTER — Other Ambulatory Visit: Payer: Self-pay | Admitting: Physician Assistant

## 2019-03-13 DIAGNOSIS — F4381 Prolonged grief disorder: Secondary | ICD-10-CM

## 2019-03-13 DIAGNOSIS — F4321 Adjustment disorder with depressed mood: Secondary | ICD-10-CM

## 2019-03-13 DIAGNOSIS — F321 Major depressive disorder, single episode, moderate: Secondary | ICD-10-CM

## 2019-03-13 DIAGNOSIS — F909 Attention-deficit hyperactivity disorder, unspecified type: Secondary | ICD-10-CM

## 2019-03-15 ENCOUNTER — Encounter: Payer: Self-pay | Admitting: Physician Assistant

## 2019-03-15 DIAGNOSIS — F430 Acute stress reaction: Secondary | ICD-10-CM

## 2019-03-15 DIAGNOSIS — M5030 Other cervical disc degeneration, unspecified cervical region: Secondary | ICD-10-CM

## 2019-03-15 DIAGNOSIS — M62838 Other muscle spasm: Secondary | ICD-10-CM

## 2019-03-15 DIAGNOSIS — F909 Attention-deficit hyperactivity disorder, unspecified type: Secondary | ICD-10-CM

## 2019-03-18 ENCOUNTER — Ambulatory Visit (INDEPENDENT_AMBULATORY_CARE_PROVIDER_SITE_OTHER): Payer: Self-pay | Admitting: Physician Assistant

## 2019-03-18 ENCOUNTER — Encounter: Payer: Self-pay | Admitting: Physician Assistant

## 2019-03-18 VITALS — BP 117/81 | HR 70 | Ht 68.0 in | Wt 153.0 lb

## 2019-03-18 DIAGNOSIS — F321 Major depressive disorder, single episode, moderate: Secondary | ICD-10-CM

## 2019-03-18 DIAGNOSIS — E559 Vitamin D deficiency, unspecified: Secondary | ICD-10-CM

## 2019-03-18 DIAGNOSIS — F4321 Adjustment disorder with depressed mood: Secondary | ICD-10-CM

## 2019-03-18 DIAGNOSIS — F4381 Prolonged grief disorder: Secondary | ICD-10-CM

## 2019-03-18 DIAGNOSIS — F430 Acute stress reaction: Secondary | ICD-10-CM | POA: Insufficient documentation

## 2019-03-18 DIAGNOSIS — F909 Attention-deficit hyperactivity disorder, unspecified type: Secondary | ICD-10-CM

## 2019-03-18 DIAGNOSIS — M62838 Other muscle spasm: Secondary | ICD-10-CM

## 2019-03-18 DIAGNOSIS — M5030 Other cervical disc degeneration, unspecified cervical region: Secondary | ICD-10-CM

## 2019-03-18 MED ORDER — AMPHETAMINE-DEXTROAMPHET ER 10 MG PO CP24: 10.0000 mg | ORAL_CAPSULE | Freq: Every day | ORAL | 0 refills | Status: DC

## 2019-03-18 MED ORDER — ALPRAZOLAM 0.5 MG PO TABS: 0.5000 mg | ORAL_TABLET | Freq: Every evening | ORAL | 3 refills | Status: DC | PRN

## 2019-03-18 MED ORDER — FLUOXETINE HCL 20 MG PO TABS: 20.0000 mg | ORAL_TABLET | Freq: Every day | ORAL | 1 refills | Status: DC

## 2019-03-18 MED ORDER — VITAMIN D (ERGOCALCIFEROL) 1.25 MG (50000 UNIT) PO CAPS: 50000.0000 [IU] | ORAL_CAPSULE | ORAL | 3 refills | Status: DC

## 2019-03-18 MED ORDER — ALPRAZOLAM 0.5 MG PO TABS: 0.5000 mg | ORAL_TABLET | Freq: Every evening | ORAL | 0 refills | Status: DC | PRN

## 2019-03-18 MED ORDER — CYCLOBENZAPRINE HCL 10 MG PO TABS: 10.0000 mg | ORAL_TABLET | Freq: Three times a day (TID) | ORAL | 1 refills | Status: DC | PRN

## 2019-03-18 MED ORDER — CYCLOBENZAPRINE HCL 10 MG PO TABS: 10.0000 mg | ORAL_TABLET | Freq: Three times a day (TID) | ORAL | 3 refills | Status: DC | PRN

## 2019-03-18 MED FILL — VIT D2 1.25 MG (50,000 UNIT: 1.25 MG | 28 days supply | Qty: 12 | Fill #0

## 2019-03-18 MED FILL — FLUOXETINE HCL 20 MG TABS: 20 | 90 days supply | Qty: 90 | Fill #0

## 2019-03-18 MED FILL — ALPRAZolam 0.5 MG TABS: 0.5 | 20 days supply | Qty: 20 | Fill #0

## 2019-03-18 MED FILL — CYCLOBENZAPRINE HCL 10 MG T: 10 | 10 days supply | Qty: 30 | Fill #0

## 2019-03-18 MED FILL — ADDERALL XR 10 MG CAP SA: 10 | 30 days supply | Qty: 30 | Fill #0

## 2019-03-18 NOTE — Progress Notes (Addendum)
Patient ID: Jacqueline Hughes, female   DOB: 12-Jan-1966, 53 y.o.   MRN: [id number] .Marland KitchenVirtual Visit via Telephone Note  I connected with Jacqueline Hughes on 03/18/19 at 10:50 AM EDT by telephone and verified that I am speaking with the correct person using two identifiers.  Location: Patient: home Provider: clinic   I discussed the limitations, risks, security and privacy concerns of performing an evaluation and management service by telephone and the availability of in person appointments. I also discussed with the patient that there may be a patient responsible charge related to this service. The patient expressed understanding and agreed to proceed.   History of Present Illness: Pt is a 53 yo female with ADHD, stress reaction, MDD, cervical neck pain and spasms who calls into the clinic for medication refills.   Pt is doing better. She is working virtually and not having to deal with interaction of co-workers. Her daughter who lost a baby last year is due in 2 weeks with another child. She feels controlled with her mood. Denies any SI/HC. Focus is great. No insomnia, palpitations, headaches.   Pt does have intermittent neck pain well controlled with exercises and flexeril.   .. Active Ambulatory Problems    Diagnosis Date Noted  . Adult ADHD 08/25/2014  . Depression 08/25/2014  . Vitamin D deficiency 08/25/2014  . Dyslexia 08/25/2014  . Food intolerance 08/29/2014  . Osteopenia 08/16/2015  . Recurrent cold sores 01/20/2016  . Varicose vein of leg 06/17/2017  . Primary insomnia 06/17/2017  . Cervical herniated disc 06/17/2017  . ADD (attention deficit disorder) 03/05/2018  . Memory changes 03/05/2018  . Word finding difficulty 03/05/2018  . Elevated LDL cholesterol level 03/07/2018  . Depression, major, single episode, moderate ([place]) 08/02/2018  . Grief reaction with prolonged bereavement 08/02/2018  . Stress at work 08/07/2018  . Muscle spasm 03/18/2019  . Acute stress reaction  03/18/2019   Resolved Ambulatory Problems    Diagnosis Date Noted  . No Resolved Ambulatory Problems   Past Medical History:  Diagnosis Date  . Anxiety   . Hyperlipidemia   . Thyroid disease     REviewed med, problem, allergy list.    Observations/Objective: No acute distress.  Normal mood. Normal breathing.   .. Today's Vitals   03/18/19 0936  BP: 117/81  Pulse: 70  Weight: 153 lb (69.4 kg)  Height: 5\' 8"  (1.727 m)   Body mass index is 23.26 kg/m.  .. Depression screen [facility] 2/9 03/18/2019 07/31/2018 06/11/2018 06/14/2017 09/16/2016  Decreased Interest 0 2 1 0 1  Down, Depressed, Hopeless 0 3 1 1 1   PHQ - 2 Score 0 5 2 1 2   Altered sleeping 1 2 3  - 0  Tired, decreased energy 1 1 2  - 1  Change in appetite 0 0 3 - 0  Feeling bad or failure about yourself  0 1 0 - 1  Trouble concentrating 3 2 2  - 0  Moving slowly or fidgety/restless 1 1 2  - 0  Suicidal thoughts 0 0 0 - 0  PHQ-9 Score 6 12 14  - 4  Difficult doing work/chores Somewhat difficult Very difficult - - -   .[name] GAD 7 : Generalized Anxiety Score 03/18/2019 07/31/2018 06/11/2018  Nervous, Anxious, on Edge 1 1 3   Control/stop worrying 1 1 3   Worry too much - different things 1 0 3  Trouble relaxing 1 1 3   Restless 1 1 3   Easily annoyed or irritable 1 3 2   Afraid -  awful might happen 0 0 2  Total GAD 7 Score 6 7 19   Anxiety Difficulty Somewhat difficult Somewhat difficult -     Assessment and Plan: Marland KitchenMarland KitchenFaith was seen today for depression.  Diagnoses and all orders for this visit:  Adult ADHD -     amphetamine-dextroamphetamine (ADDERALL XR) 10 MG 24 hr capsule; Take 1 capsule (10 mg total) by mouth daily. -     amphetamine-dextroamphetamine (ADDERALL XR) 10 MG 24 hr capsule; Take 1 capsule (10 mg total) by mouth daily. -     amphetamine-dextroamphetamine (ADDERALL XR) 10 MG 24 hr capsule; Take 1 capsule (10 mg total) by mouth daily.  Acute stress reaction -     ALPRAZolam (XANAX) 0.5 MG tablet; Take 1  tablet (0.5 mg total) by mouth at bedtime as needed for anxiety.  DDD (degenerative disc disease), cervical -     cyclobenzaprine (FLEXERIL) 10 MG tablet; Take 1 tablet (10 mg total) by mouth 3 (three) times daily as needed for muscle spasms.  Muscle spasm -     cyclobenzaprine (FLEXERIL) 10 MG tablet; Take 1 tablet (10 mg total) by mouth 3 (three) times daily as needed for muscle spasms.  Grief reaction with prolonged bereavement -     FLUoxetine (PROZAC) 20 MG tablet; Take 1 tablet (20 mg total) by mouth daily.  Depression, major, single episode, moderate (HCC) -     FLUoxetine (PROZAC) 20 MG tablet; Take 1 tablet (20 mg total) by mouth daily.  Vitamin D deficiency -     Vitamin D, Ergocalciferol, (DRISDOL) 1.25 MG (50000 UT) CAPS capsule; Take 1 capsule (50,000 Units total) by mouth every 7 (seven) days.   Refilled adderall for 3 months.   PHQ-9 and GAD-7 look better. Continue on prozac.   Sent other refills as well. Xanax continue to use as needed but sparingly.   Discussed need for colonoscopy and labs. Will follow up in 3 months. Due to COVID-19 will postpone.     Follow Up Instructions:    I discussed the assessment and treatment plan with the patient. The patient was provided an opportunity to ask questions and all were answered. The patient agreed with the plan and demonstrated an understanding of the instructions.   The patient was advised to call back or seek an in-person evaluation if the symptoms worsen or if the condition fails to improve as anticipated.  I provided 10 minutes of non-face-to-face time during this encounter.   Iran Planas, PA-C

## 2019-03-18 NOTE — Progress Notes (Deleted)
Patient doing okay. Has been out of Adderall. Needs refills. PHQ9-GAD7 completed.

## 2019-05-31 MED FILL — CYCLOBENZAPRINE HCL 10 MG T: 10 | 10 days supply | Qty: 30 | Fill #0

## 2019-05-31 MED FILL — ADDERALL XR 10 MG CAP SA: 10 | 30 days supply | Qty: 30 | Fill #0

## 2019-05-31 MED FILL — VIT D2 1.25 MG (50,000 UNIT: 1.25 MG | 84 days supply | Qty: 12 | Fill #1

## 2019-07-31 ENCOUNTER — Telehealth: Payer: Self-pay

## 2019-07-31 ENCOUNTER — Encounter: Payer: Self-pay | Admitting: Physician Assistant

## 2019-07-31 NOTE — Telephone Encounter (Signed)
Speaking with her via mychart.

## 2019-07-31 NOTE — Telephone Encounter (Signed)
Jacqueline Hughes called and left a message stating she needs a letter for breaks from wearing a mask. Wearing a mask all day is causing her to have anxiety, headaches, rash and shortness of breath. She does have a past history of Asthma. Please advise. She is ok if you want to send her a MyChart message.

## 2019-08-01 MED FILL — FLUOXETINE HCL 20 MG TABS: 20 | 90 days supply | Qty: 90 | Fill #1

## 2019-08-01 MED FILL — ADDERALL XR 10 MG CAP SA: 10 | 30 days supply | Qty: 30 | Fill #0

## 2019-09-16 ENCOUNTER — Encounter: Payer: Self-pay | Admitting: Emergency Medicine

## 2019-09-16 ENCOUNTER — Other Ambulatory Visit: Payer: Self-pay

## 2019-09-16 ENCOUNTER — Emergency Department
Admission: EM | Admit: 2019-09-16 | Discharge: 2019-09-16 | Disposition: A | Discharge status: Home / Self Care | Payer: No Typology Code available for payment source | Source: Home / Self Care | Attending: Family Medicine | Admitting: Family Medicine

## 2019-09-16 DIAGNOSIS — J069 Acute upper respiratory infection, unspecified: Secondary | ICD-10-CM

## 2019-09-16 DIAGNOSIS — Z9189 Other specified personal risk factors, not elsewhere classified: Secondary | ICD-10-CM | POA: Diagnosis not present

## 2019-09-16 LAB — POC SARS CORONAVIRUS 2 AG -  ED: SARS Coronavirus 2 Ag: NEGATIVE

## 2019-09-16 NOTE — Discharge Instructions (Signed)
For increasing cold-like symptoms try the following: Take plain guaifenesin (1213m extended release tabs such as Mucinex) twice daily, with plenty of water, for cough and congestion.  Get adequate rest.   May use Afrin nasal spray (or generic oxymetazoline) each morning for about 5 days and then discontinue.  Also recommend using saline nasal spray several times daily and saline nasal irrigation (AYR is a common brand).   Try warm salt water gargles for sore throat.  Stop all antihistamines for now, and other non-prescription cough/cold preparations. May take Ibuprofen 2023m 4 tabs every 8 hours with food for body aches, headache, etc. May take Delsym Cough Suppressant at bedtime for nighttime cough.    If COVID-19 test is positive, isolate yourself until the below conditions are met: 1)  At least 7 days since symptoms onset. AND 2)  > 72 hours after symptom resolution (absence of fever without the use of fever-reducing medicine, and improvement in respiratory symptoms.

## 2019-09-16 NOTE — ED Triage Notes (Signed)
Patient here due to exposure at work to [place] positive people and mild symptoms of headache, aches in arms, and mild congestion. 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

## 2019-09-16 NOTE — ED Provider Notes (Signed)
[facility]    CSN: [id number] Arrival date & time: 09/16/19  1636      History   Chief Complaint Chief Complaint  Patient presents with  . Generalized Body Aches  . Headache  . Nasal Congestion    HPI Jacqueline Hughes is a 53 y.o. female.   Patient complains of six day history of typical cold-like symptoms developing over several days, including mild sore throat, sinus congestion, headache, fatigue, chills,  and cough.  She denies chest tightness, shortness of breath, and change in taste/smell.  She has been exposed to several COVID19 positive people at work.  The history is provided by the patient.    Past Medical History:  Diagnosis Date  . ADD (attention deficit disorder)   . Anxiety   . Depression   . Hyperlipidemia   . Osteopenia   . Thyroid disease   . Vitamin D deficiency     Patient Active Problem List   Diagnosis Date Noted  . Muscle spasm 03/18/2019  . Acute stress reaction 03/18/2019  . Stress at work 08/07/2018  . Depression, major, single episode, moderate ([place]) 08/02/2018  . Grief reaction with prolonged bereavement 08/02/2018  . Elevated LDL cholesterol level 03/07/2018  . ADD (attention deficit disorder) 03/05/2018  . Memory changes 03/05/2018  . Word finding difficulty 03/05/2018  . Varicose vein of leg 06/17/2017  . Primary insomnia 06/17/2017  . Cervical herniated disc 06/17/2017  . Recurrent cold sores 01/20/2016  . Osteopenia 08/16/2015  . Food intolerance 08/29/2014  . Adult ADHD 08/25/2014  . Depression 08/25/2014  . Vitamin D deficiency 08/25/2014  . Dyslexia 08/25/2014    Past Surgical History:  Procedure Laterality Date  . ABDOMINAL HYSTERECTOMY    . AUGMENTATION MAMMAPLASTY    . BREAST SURGERY    . LAPAROTOMY      OB History   No obstetric history on file.      Home Medications    Prior to Admission medications   Medication Sig Start Date End Date Taking? Authorizing Provider  ALPRAZolam Duanne Moron) 0.5 MG  tablet Take 1 tablet (0.5 mg total) by mouth at bedtime as needed for anxiety. 03/18/19   Breeback, Jade L, PA-C  amphetamine-dextroamphetamine (ADDERALL XR) 10 MG 24 hr capsule Take 1 capsule (10 mg total) by mouth daily. 03/18/19   Breeback, Jade L, PA-C  amphetamine-dextroamphetamine (ADDERALL XR) 10 MG 24 hr capsule Take 1 capsule (10 mg total) by mouth daily. 04/18/19   Breeback, Jade L, PA-C  amphetamine-dextroamphetamine (ADDERALL XR) 10 MG 24 hr capsule Take 1 capsule (10 mg total) by mouth daily. 05/18/19   Breeback, Royetta Car, PA-C  cyclobenzaprine (FLEXERIL) 10 MG tablet Take 1 tablet (10 mg total) by mouth 3 (three) times daily as needed for muscle spasms. 03/18/19   Breeback, Jade L, PA-C  FLUoxetine (PROZAC) 20 MG tablet Take 1 tablet (20 mg total) by mouth daily. 03/18/19   Breeback, Royetta Car, PA-C  Vitamin D, Ergocalciferol, (DRISDOL) 1.25 MG (50000 UT) CAPS capsule Take 1 capsule (50,000 Units total) by mouth every 7 (seven) days. 03/18/19   Donella Stade, PA-C    Family History Family History  Problem Relation Age of Onset  . Alcoholism Other        granparents  . Depression Other        aunts    Social History Social History   Tobacco Use  . Smoking status: Former Smoker    Quit date: 08/25/2004    Years  since quitting: 15.0  . Smokeless tobacco: Never Used  Substance Use Topics  . Alcohol use: Yes  . Drug use: No     Allergies   E-mycin [erythromycin] and Lexapro [escitalopram oxalate]   Review of Systems Review of Systems + sore throat + cough No pleuritic pain No wheezing + nasal congestion ? post-nasal drainage No sinus pain/pressure No itchy/red eyes No earache No hemoptysis No SOB No fever, + chills No nausea No vomiting No abdominal pain No diarrhea No urinary symptoms No skin rash + fatigue + myalgias + headache    Physical Exam Triage Vital Signs ED Triage Vitals  Enc Vitals Group     BP 09/16/19 1824 134/87     Pulse Rate 09/16/19  1824 76     Resp 09/16/19 1824 16     Temp 09/16/19 1824 98.2 F (36.8 C)     Temp Source 09/16/19 1824 Oral     SpO2 09/16/19 1824 100 %     Weight 09/16/19 1825 152 lb 1.9 oz (69 kg)     Height 09/16/19 1825 5' 8" (1.727 m)     Head Circumference --      Peak Flow --      Pain Score 09/16/19 1825 1     Pain Loc --      Pain Edu? --      Excl. in [place]? --    No data found.  Updated Vital Signs BP 134/87 (BP Location: Right Arm)   Pulse 76   Temp 98.2 F (36.8 C) (Oral)   Resp 16   Ht 5' 8" (1.727 m)   Wt 69 kg   SpO2 100%   BMI 23.13 kg/m   Visual Acuity Right Eye Distance:   Left Eye Distance:   Bilateral Distance:    Right Eye Near:   Left Eye Near:    Bilateral Near:     Physical Exam Nursing notes and Vital Signs reviewed. Appearance:  Patient appears stated age, and in no acute distress Eyes:  Pupils are equal, round, and reactive to light and accomodation.  Extraocular movement is intact.  Conjunctivae are not inflamed  Ears:  Canals normal.  Tympanic membranes normal.  Nose:  Mildly congested turbinates.  No sinus tenderness.    Pharynx:  Normal Neck:  Supple.  Enlarged posterior/lateral nodes are palpated bilaterally, tender to palpation on the left.   Lungs:  Clear to auscultation.  Breath sounds are equal.  Moving air well. Heart:  Regular rate and rhythm without murmurs, rubs, or gallops.  Abdomen:  Nontender without masses or hepatosplenomegaly.  Bowel sounds are present.  No CVA or flank tenderness.  Extremities:  No edema.  Skin:  No rash present.    UC Treatments / Results  Labs (all labs ordered are listed, but only abnormal results are displayed) Labs Reviewed  NOVEL CORONAVIRUS, NAA  POC SARS CORONAVIRUS 2 ED negative    EKG   Radiology No results found.  Procedures Procedures (including critical care time)  Medications Ordered in UC Medications - No data to display  Initial Impression / Assessment and Plan / UC Course  I have  reviewed the triage vital signs and the nursing notes.  Pertinent labs & imaging results that were available during my care of the patient were reviewed by me and considered in my medical decision making (see chart for details).    There is no evidence of bacterial infection today.  Treat symptomatically for now  COVID19  send out. If symptoms become significantly worse during the night or over the weekend, proceed to the local emergency room.    Final Clinical Impressions(s) / UC Diagnoses   Final diagnoses:  At increased risk of exposure to COVID-19 virus  Viral URI with cough     Discharge Instructions     For increasing cold-like symptoms try the following: Take plain guaifenesin (1246m extended release tabs such as Mucinex) twice daily, with plenty of water, for cough and congestion.  Get adequate rest.   May use Afrin nasal spray (or generic oxymetazoline) each morning for about 5 days and then discontinue.  Also recommend using saline nasal spray several times daily and saline nasal irrigation (AYR is a common brand).   Try warm salt water gargles for sore throat.  Stop all antihistamines for now, and other non-prescription cough/cold preparations. May take Ibuprofen 2015m 4 tabs every 8 hours with food for body aches, headache, etc. May take Delsym Cough Suppressant at bedtime for nighttime cough.    If COVID-19 test is positive, isolate yourself until the below conditions are met: 1)  At least 7 days since symptoms onset. AND 2)  > 72 hours after symptom resolution (absence of fever without the use of fever-reducing medicine, and improvement in respiratory symptoms.        ED Prescriptions    None        BeKandra NicolasMD 09/17/19 19Einar Crow

## 2019-09-19 LAB — NOVEL CORONAVIRUS, NAA: SARS-CoV-2, NAA: NOT DETECTED

## 2019-10-01 ENCOUNTER — Other Ambulatory Visit: Payer: Self-pay | Admitting: Physician Assistant

## 2019-10-01 DIAGNOSIS — F909 Attention-deficit hyperactivity disorder, unspecified type: Secondary | ICD-10-CM

## 2019-10-01 NOTE — Telephone Encounter (Signed)
Needs appt

## 2019-10-02 NOTE — Telephone Encounter (Signed)
Called patient and advised needs office visit. Sent to schedule appointment. KG LPN

## 2019-10-04 ENCOUNTER — Ambulatory Visit (INDEPENDENT_AMBULATORY_CARE_PROVIDER_SITE_OTHER): Payer: No Typology Code available for payment source | Admitting: Physician Assistant

## 2019-10-04 ENCOUNTER — Encounter: Payer: Self-pay | Admitting: Physician Assistant

## 2019-10-04 VITALS — Temp 97.4°F | Ht 68.0 in | Wt 158.0 lb

## 2019-10-04 DIAGNOSIS — F329 Major depressive disorder, single episode, unspecified: Secondary | ICD-10-CM | POA: Diagnosis not present

## 2019-10-04 DIAGNOSIS — F909 Attention-deficit hyperactivity disorder, unspecified type: Secondary | ICD-10-CM

## 2019-10-04 DIAGNOSIS — F5101 Primary insomnia: Secondary | ICD-10-CM

## 2019-10-04 DIAGNOSIS — F4321 Adjustment disorder with depressed mood: Secondary | ICD-10-CM

## 2019-10-04 DIAGNOSIS — F4381 Prolonged grief disorder: Secondary | ICD-10-CM

## 2019-10-04 DIAGNOSIS — F321 Major depressive disorder, single episode, moderate: Secondary | ICD-10-CM

## 2019-10-04 DIAGNOSIS — E559 Vitamin D deficiency, unspecified: Secondary | ICD-10-CM

## 2019-10-04 DIAGNOSIS — F4329 Adjustment disorder with other symptoms: Secondary | ICD-10-CM

## 2019-10-04 MED ORDER — AMPHETAMINE-DEXTROAMPHET ER 15 MG PO CP24: 15.0000 mg | ORAL_CAPSULE | ORAL | 0 refills | Status: DC

## 2019-10-04 MED ORDER — VITAMIN D (ERGOCALCIFEROL) 1.25 MG (50000 UNIT) PO CAPS: 50000.0000 [IU] | ORAL_CAPSULE | ORAL | 3 refills | Status: DC

## 2019-10-04 MED ORDER — ESZOPICLONE 2 MG PO TABS: 2.0000 mg | ORAL_TABLET | Freq: Every evening | ORAL | 1 refills | Status: DC | PRN

## 2019-10-04 MED FILL — VIT D2 1.25 MG (50,000 UNIT: 1.25 MG | 84 days supply | Qty: 12 | Fill #0

## 2019-10-04 MED FILL — ESZOPICLONE 2 MG TAB: 2 MG | 90 days supply | Qty: 90 | Fill #0

## 2019-10-04 MED FILL — ADDERALL XR 15 MG CAP SA: 15 | 30 days supply | Qty: 30 | Fill #0

## 2019-10-04 NOTE — Progress Notes (Signed)
Needs refills. Having stress with job. PHQ9-GAD7 completed.

## 2019-10-04 NOTE — Progress Notes (Signed)
Patient ID: Jacqueline Hughes, female   DOB: [birth year], 53 y.o.   MRN: [id number] .Marland KitchenVirtual Visit via Video Note  I connected with Jacqueline Hughes on 10/07/19 at  4:00 PM EST by a video enabled telemedicine application and verified that I am speaking with the correct person using two identifiers.  Location: Patient: home Provider: clinic   I discussed the limitations of evaluation and management by telemedicine and the availability of in person appointments. The patient expressed understanding and agreed to proceed.  History of Present Illness: Patient is a 53 year old female with ADHD, depression, insomnia who comes into the clinic for medication refills.  Patient is doing wonderful.  She is in a really good spot.  Her daughters are happy.  She was having a lot of family stress with her daughters infertility.  She now has a new grandbaby.  Work is going okay.  She denies any suicidal thoughts or homicidal idealizations.  She is still using Prozac daily.  She is using Adderall daily.  She is using [place] daily.  She has been on same dose of Adderall for quite a long time.  She feels like it is not lasting as long throughout the day as it once did.  She wonders if an increased dose would be warranted.  .. Active Ambulatory Problems    Diagnosis Date Noted  . Adult ADHD 08/25/2014  . Depression 08/25/2014  . Vitamin D deficiency 08/25/2014  . Dyslexia 08/25/2014  . Food intolerance 08/29/2014  . Osteopenia 08/16/2015  . Recurrent cold sores 01/20/2016  . Varicose vein of leg 06/17/2017  . Primary insomnia 06/17/2017  . Cervical herniated disc 06/17/2017  . ADD (attention deficit disorder) 03/05/2018  . Memory changes 03/05/2018  . Word finding difficulty 03/05/2018  . Elevated LDL cholesterol level 03/07/2018  . Depression, major, single episode, moderate ([place]) 08/02/2018  . Grief reaction with prolonged bereavement 08/02/2018  . Stress at work 08/07/2018  . Muscle spasm 03/18/2019  .  Acute stress reaction 03/18/2019   Resolved Ambulatory Problems    Diagnosis Date Noted  . No Resolved Ambulatory Problems   Past Medical History:  Diagnosis Date  . Anxiety   . Hyperlipidemia   . Thyroid disease    Reviewed med, allergy, problem list.     Observations/Objective: No acute distress Normal mood and appearance.   .. Today's Vitals   10/04/19 0920  Temp: (!) 97.4 F (36.3 C)  TempSrc: Oral  Weight: 158 lb (71.7 kg)  Height: 5\' 8"  (1.727 m)   Body mass index is 24.02 kg/m.   .. Depression screen [facility] 2/9 10/04/2019 03/18/2019 07/31/2018 06/11/2018 06/14/2017  Decreased Interest 1 0 2 1 0  Down, Depressed, Hopeless 1 0 3 1 1   PHQ - 2 Score 2 0 5 2 1   Altered sleeping 1 1 2 3  -  Tired, decreased energy 2 1 1 2  -  Change in appetite 1 0 0 3 -  Feeling bad or failure about yourself  1 0 1 0 -  Trouble concentrating 2 3 2 2  -  Moving slowly or fidgety/restless 2 1 1 2  -  Suicidal thoughts 0 0 0 0 -  PHQ-9 Score 11 6 12 14  -  Difficult doing work/chores Somewhat difficult Somewhat difficult Very difficult - -   .[name] GAD 7 : Generalized Anxiety Score 10/04/2019 03/18/2019 07/31/2018 06/11/2018  Nervous, Anxious, on Edge 1 1 1 3   Control/stop worrying 1 1 1 3   Worry too much - different things  1 1 0 3  Trouble relaxing 1 1 1 3   Restless 1 1 1 3   Easily annoyed or irritable 1 1 3 2   Afraid - awful might happen 1 0 0 2  Total GAD 7 Score 7 6 7 19   Anxiety Difficulty Somewhat difficult Somewhat difficult Somewhat difficult -     Assessment and Plan: Marland KitchenMarland KitchenFaith was seen today for adhd.  Diagnoses and all orders for this visit:  Adult ADHD -     amphetamine-dextroamphetamine (ADDERALL XR) 15 MG 24 hr capsule; Take 1 capsule by mouth every morning.  Vitamin D deficiency -     Vitamin D, Ergocalciferol, (DRISDOL) 1.25 MG (50000 UT) CAPS capsule; Take 1 capsule (50,000 Units total) by mouth every 7 (seven) days.  Reactive depression  Primary insomnia -      eszopiclone (LUNESTA) 2 MG TABS tablet; Take 1 tablet (2 mg total) by mouth at bedtime as needed for sleep. Take immediately before bedtime  Grief reaction with prolonged bereavement -     FLUoxetine (PROZAC) 20 MG tablet; Take 1 tablet (20 mg total) by mouth daily.  Depression, major, single episode, moderate ([facility]) -     FLUoxetine (PROZAC) 20 MG tablet; Take 1 tablet (20 mg total) by mouth daily.   PHQ-9 and GAD-7 are stable.  Prozac continued.  Lunesta and Adderall.  I did go ahead and increase the Adderall dose to 15 mg.  We will try this for 1 month.  She can report back if she is doing well with no side effects to the increase.  If so we will go ahead and refill for another 2 months.  Follow-up in 3 months.   Follow Up Instructions:    I discussed the assessment and treatment plan with the patient. The patient was provided an opportunity to ask questions and all were answered. The patient agreed with the plan and demonstrated an understanding of the instructions.   The patient was advised to call back or seek an in-person evaluation if the symptoms worsen or if the condition fails to improve as anticipated.   Iran Planas, PA-C

## 2019-10-07 ENCOUNTER — Encounter: Payer: Self-pay | Admitting: Physician Assistant

## 2019-10-07 MED ORDER — FLUOXETINE HCL 20 MG PO TABS: 20.0000 mg | ORAL_TABLET | Freq: Every day | ORAL | 1 refills | Status: DC

## 2019-10-11 NOTE — Telephone Encounter (Signed)
Patient seen you on 10/02/19. KG LPN

## 2019-12-02 ENCOUNTER — Encounter: Payer: Self-pay | Admitting: Physician Assistant

## 2019-12-02 ENCOUNTER — Other Ambulatory Visit: Payer: Self-pay | Admitting: Physician Assistant

## 2019-12-02 DIAGNOSIS — F909 Attention-deficit hyperactivity disorder, unspecified type: Secondary | ICD-10-CM

## 2019-12-03 MED ORDER — AMPHETAMINE-DEXTROAMPHET ER 15 MG PO CP24: 15.0000 mg | ORAL_CAPSULE | ORAL | 0 refills | Status: DC

## 2019-12-03 MED FILL — ADDERALL XR 15 MG CAP SA: 15 | 30 days supply | Qty: 30 | Fill #0

## 2019-12-06 MED FILL — FLUOXETINE HCL 20 MG TABS: 20 | 90 days supply | Qty: 90 | Fill #0

## 2019-12-10 MED FILL — VIT D2 1.25 MG (50,000 UNIT: 1.25 MG | 84 days supply | Qty: 12 | Fill #2

## 2020-01-01 ENCOUNTER — Telehealth (INDEPENDENT_AMBULATORY_CARE_PROVIDER_SITE_OTHER): Payer: No Typology Code available for payment source | Admitting: Physician Assistant

## 2020-01-01 VITALS — Ht 68.0 in | Wt 158.0 lb

## 2020-01-01 DIAGNOSIS — R21 Rash and other nonspecific skin eruption: Secondary | ICD-10-CM | POA: Diagnosis not present

## 2020-01-01 DIAGNOSIS — T50Z95A Adverse effect of other vaccines and biological substances, initial encounter: Secondary | ICD-10-CM | POA: Insufficient documentation

## 2020-01-01 DIAGNOSIS — L299 Pruritus, unspecified: Secondary | ICD-10-CM | POA: Diagnosis not present

## 2020-01-01 MED ORDER — HYDROXYZINE PAMOATE 25 MG PO CAPS: 25.0000 mg | ORAL_CAPSULE | Freq: Three times a day (TID) | ORAL | 0 refills | Status: DC | PRN

## 2020-01-01 MED ORDER — PREDNISONE 20 MG PO TABS: ORAL_TABLET | ORAL | 0 refills | Status: DC

## 2020-01-01 NOTE — Progress Notes (Signed)
Covid vaccine (left arm) - Saturday - broke out in rash on right side right after injection Took benadryl  Ambulance evaluated  Itching at back of tongue but no swelling She ended up not going to hospital and went home  Took another dose of Benadryl -  Woke up Sunday with no issues Some pink on body Arm - little sore/ muscle stiffness Monday - okay  Tuesday - lethargic Last night - face and forehead went numb, felt heavy, was a little "puffy" She took 2 aspirin 81 mg  Right shoulder pain Took benadryl and 2 hours later could feel face again Cheeks had some redness Diarrhea at bedtime, stomach cramps, went on all night (stopped at 8:30 am)  Today: Rash on neck and arms (looks like redness coming from underneath skin and patchy looking - no bumps)

## 2020-01-02 ENCOUNTER — Emergency Department
Admission: EM | Admit: 2020-01-02 | Discharge: 2020-01-02 | Disposition: A | Discharge status: Home / Self Care | Payer: No Typology Code available for payment source | Source: Home / Self Care | Attending: Family Medicine | Admitting: Family Medicine

## 2020-01-02 ENCOUNTER — Encounter: Payer: Self-pay | Admitting: Emergency Medicine

## 2020-01-02 ENCOUNTER — Encounter: Payer: Self-pay | Admitting: Physician Assistant

## 2020-01-02 ENCOUNTER — Other Ambulatory Visit: Payer: Self-pay

## 2020-01-02 DIAGNOSIS — Z9189 Other specified personal risk factors, not elsewhere classified: Secondary | ICD-10-CM

## 2020-01-02 NOTE — ED Triage Notes (Signed)
Diarrhea since Sunday night

## 2020-01-02 NOTE — Discharge Instructions (Signed)
Isolate yourself until COVID-19 test result is available.   If your COVID19 test is positive, then you are infected with the novel coronavirus and could give the virus to others.  Please continue isolation at home for at least 10 days from today.  Once you complete your 10 day quarantine, you may return to normal activities as long as you've not had a fever for over 24 hours (without taking fever reducing medicine) and your symptoms are improving. Please continue good preventive care measures, including:  frequent hand-washing, avoid touching your face, cover coughs/sneezes, stay out of crowds and keep a 6 foot distance from others.  Go to the nearest hospital emergency room if fever/cough/breathlessness are severe or illness seems like a threat to life.

## 2020-01-02 NOTE — ED Triage Notes (Signed)
denies exposure c/o loss of taste & smell x 24 hours Pt requests a COVID test

## 2020-01-02 NOTE — ED Provider Notes (Signed)
[facility]    CSN: [id number] Arrival date & time: 01/02/20  1813      History   Chief Complaint Chief Complaint  Patient presents with  . Covid Exposure    denies exposure c/o loss of taste & smell x 24 hours    HPI GENERRA GRADWELL is a 54 y.o. female.   Patient reports that she received a COVID19 vaccination five days ago.  She subsequently developed a faint rash, mostly on her arms.  Three days ago she lost her sense of taste/smell which has persisted.  She has had loose stools this week.  She denies cough and tightness in her chest.  She feels well otherwise.  The history is provided by the patient.    Past Medical History:  Diagnosis Date  . ADD (attention deficit disorder)   . Anxiety   . Depression   . Hyperlipidemia   . Osteopenia   . Thyroid disease   . Vitamin D deficiency     Patient Active Problem List   Diagnosis Date Noted  . Immunization reaction 01/01/2020  . Muscle spasm 03/18/2019  . Acute stress reaction 03/18/2019  . Stress at work 08/07/2018  . Depression, major, single episode, moderate ([place]) 08/02/2018  . Grief reaction with prolonged bereavement 08/02/2018  . Elevated LDL cholesterol level 03/07/2018  . ADD (attention deficit disorder) 03/05/2018  . Memory changes 03/05/2018  . Word finding difficulty 03/05/2018  . Varicose vein of leg 06/17/2017  . Primary insomnia 06/17/2017  . Cervical herniated disc 06/17/2017  . Recurrent cold sores 01/20/2016  . Osteopenia 08/16/2015  . Food intolerance 08/29/2014  . Adult ADHD 08/25/2014  . Depression 08/25/2014  . Vitamin D deficiency 08/25/2014  . Dyslexia 08/25/2014    Past Surgical History:  Procedure Laterality Date  . ABDOMINAL HYSTERECTOMY    . AUGMENTATION MAMMAPLASTY    . BREAST SURGERY    . LAPAROTOMY      OB History   No obstetric history on file.      Home Medications    Prior to Admission medications   Medication Sig Start Date End Date Taking?  Authorizing Provider  ALPRAZolam Duanne Moron) 0.5 MG tablet Take 1 tablet (0.5 mg total) by mouth at bedtime as needed for anxiety. 03/18/19  Yes Breeback, Jade L, PA-C  amphetamine-dextroamphetamine (ADDERALL XR) 15 MG 24 hr capsule Take 1 capsule by mouth every morning. 12/03/19  Yes Breeback, Jade L, PA-C  eszopiclone (LUNESTA) 2 MG TABS tablet Take 1 tablet (2 mg total) by mouth at bedtime as needed for sleep. Take immediately before bedtime 10/04/19  Yes Breeback, Jade L, PA-C  predniSONE (DELTASONE) 20 MG tablet Take 3 tablets for 3 days, take 2 tablets for 3 days, take 1 tablet for 3 days, take 1/2 tablet for 4 days. 01/01/20  Yes Breeback, Jade L, PA-C  amphetamine-dextroamphetamine (ADDERALL XR) 10 MG 24 hr capsule Take 1 capsule (10 mg total) by mouth daily. 04/18/19   Breeback, Royetta Car, PA-C  cyclobenzaprine (FLEXERIL) 10 MG tablet Take 1 tablet (10 mg total) by mouth 3 (three) times daily as needed for muscle spasms. 03/18/19   Breeback, Jade L, PA-C  FLUoxetine (PROZAC) 20 MG tablet Take 1 tablet (20 mg total) by mouth daily. 10/07/19   Breeback, Royetta Car, PA-C  hydrOXYzine (VISTARIL) 25 MG capsule Take 1 capsule (25 mg total) by mouth 3 (three) times daily as needed. 01/01/20   Breeback, Royetta Car, PA-C  Vitamin D, Ergocalciferol, (DRISDOL) 1.25 MG (  50000 UT) CAPS capsule Take 1 capsule (50,000 Units total) by mouth every 7 (seven) days. 10/04/19   Donella Stade, PA-C    Family History Family History  Problem Relation Age of Onset  . Alcoholism Other        granparents  . Depression Other        aunts    Social History Social History   Tobacco Use  . Smoking status: Former Smoker    Quit date: 08/25/2004    Years since quitting: 15.3  . Smokeless tobacco: Never Used  Substance Use Topics  . Alcohol use: Yes    Comment: social  . Drug use: No     Allergies   E-mycin [erythromycin], Lexapro [escitalopram oxalate], and Covid-19 mrna vaccine (pfizer) [covid-19 mrna vacc  (moderna)]   Review of Systems Review of Systems No sore throat No cough No pleuritic pain No wheezing No nasal congestion No post-nasal drainage No sinus pain/pressure No itchy/red eyes No earache No hemoptysis No SOB No fever/chills No nausea No vomiting No abdominal pain + diarrhea No urinary symptoms + skin rash No fatigue No myalgias No headache   Physical Exam Triage Vital Signs ED Triage Vitals  Enc Vitals Group     BP 01/02/20 1835 125/84     Pulse Rate 01/02/20 1835 82     Resp 01/02/20 1835 17     Temp 01/02/20 1835 98.9 F (37.2 C)     Temp Source 01/02/20 1835 Oral     SpO2 01/02/20 1835 97 %     Weight 01/02/20 1837 156 lb (70.8 kg)     Height 01/02/20 1837 5\' 8"  (1.727 m)     Head Circumference --      Peak Flow --      Pain Score 01/02/20 1836 0     Pain Loc --      Pain Edu? --      Excl. in [place]? --    No data found.  Updated Vital Signs BP 125/84 (BP Location: Right Arm)   Pulse 82   Temp 98.9 F (37.2 C) (Oral)   Resp 17   Ht 5\' 8"  (1.727 m)   Wt 70.8 kg   SpO2 97%   BMI 23.72 kg/m   Visual Acuity Right Eye Distance:   Left Eye Distance:   Bilateral Distance:    Right Eye Near:   Left Eye Near:    Bilateral Near:     Physical Exam Nursing notes and Vital Signs reviewed. Appearance:  Patient appears stated age, and in no acute distress Eyes:  Pupils are equal, round, and reactive to light and accomodation.  Extraocular movement is intact.  Conjunctivae are not inflamed  Ears:  Canals normal.  Tympanic membranes normal.  Nose:   Normal turbinates.  No sinus tenderness.  Pharynx:  Normal Neck:  Supple.  Mildly enlarged lateral nodes are present, tender to palpation on the left.   Lungs:  Clear to auscultation.  Breath sounds are equal.  Moving air well. Heart:  Regular rate and rhythm without murmurs, rubs, or gallops.  Abdomen:  Nontender without masses or hepatosplenomegaly.  Bowel sounds are present.  No CVA or flank  tenderness.  Extremities:  No edema.  Skin:  No rash present.   UC Treatments / Results  Labs (all labs ordered are listed, but only abnormal results are displayed) Labs Reviewed  NOVEL CORONAVIRUS, NAA    EKG   Radiology No results found.  Procedures Procedures (  including critical care time)  Medications Ordered in UC Medications - No data to display  Initial Impression / Assessment and Plan / UC Course  I have reviewed the triage vital signs and the nursing notes.  Pertinent labs & imaging results that were available during my care of the patient were reviewed by me and considered in my medical decision making (see chart for details).    Suspect COVID19 infection. COVID19 send out test.  Final Clinical Impressions(s) / UC Diagnoses   Final diagnoses:  At increased risk of exposure to COVID-19 virus     Discharge Instructions     Isolate yourself until COVID-19 test result is available.   If your COVID19 test is positive, then you are infected with the novel coronavirus and could give the virus to others.  Please continue isolation at home for at least 10 days from today.  Once you complete your 10 day quarantine, you may return to normal activities as long as you've not had a fever for over 24 hours (without taking fever reducing medicine) and your symptoms are improving. Please continue good preventive care measures, including:  frequent hand-washing, avoid touching your face, cover coughs/sneezes, stay out of crowds and keep a 6 foot distance from others.  Go to the nearest hospital emergency room if fever/cough/breathlessness are severe or illness seems like a threat to life.     ED Prescriptions    None        Kandra Nicolas, MD 01/06/20 [phone]

## 2020-01-03 ENCOUNTER — Encounter: Payer: Self-pay | Admitting: Physician Assistant

## 2020-01-03 LAB — COMPLETE METABOLIC PANEL WITH GFR
AG Ratio: 1.8 (calc) (ref 1.0–2.5)
ALT: 12 U/L (ref 6–29)
AST: 16 U/L (ref 10–35)
Albumin: 4.4 g/dL (ref 3.6–5.1)
Alkaline phosphatase (APISO): 55 U/L (ref 37–153)
BUN: 10 mg/dL (ref 7–25)
CO2: 29 mmol/L (ref 20–32)
Calcium: 9.2 mg/dL (ref 8.6–10.4)
Chloride: 102 mmol/L (ref 98–110)
Creat: 0.79 mg/dL (ref 0.50–1.05)
GFR, Est African American: 100 mL/min/{1.73_m2} (ref >=60)
GFR, Est Non African American: 86 mL/min/{1.73_m2} (ref >=60)
Globulin: 2.5 g/dL (calc) (ref 1.9–3.7)
Glucose, Bld: 118 mg/dL — ABNORMAL HIGH (ref 65–99)
Potassium: 3.9 mmol/L (ref 3.5–5.3)
Sodium: 140 mmol/L (ref 135–146)
Total Bilirubin: 0.3 mg/dL (ref 0.2–1.2)
Total Protein: 6.9 g/dL (ref 6.1–8.1)

## 2020-01-03 NOTE — Progress Notes (Signed)
Patient ID: Jacqueline Hughes, female   DOB: May 23, 1966, 54 y.o.   MRN: [id number] .Marland KitchenVirtual Visit via Video Note  I connected with Jacqueline Hughes on 01/01/2020 at  3:20 PM EST by a video enabled telemedicine application and verified that I am speaking with the correct person using two identifiers.  Location: Patient: Home Provider: clinic   I discussed the limitations of evaluation and management by telemedicine and the availability of in person appointments. The patient expressed understanding and agreed to proceed.  History of Present Illness: Pt is a 54 yo female who contacts office to follow up after vaccine reaction to covid shot on Saturday. She started breaking out in a rash in the 15 minute wait time window after the shot. EMS was called and evaluated her. BP normal and breathing normal. She was very itching and had rash. She has been taking benadryl which has been helping. She did get better and woke up next day feeling better other than left arm soreness and muscle stiffness. Tuesday started feeling more tired and her face and forehead felt heavy and numb. She even had some numbness and tingling going into both arms. She took 2 ASA 81 mg and benadryl. It helped. She had some intermittent loose stools and cramps. She stills has a little itchy rash. No SOB or problems swallowing.    .. Active Ambulatory Problems    Diagnosis Date Noted  . Adult ADHD 08/25/2014  . Depression 08/25/2014  . Vitamin D deficiency 08/25/2014  . Dyslexia 08/25/2014  . Food intolerance 08/29/2014  . Osteopenia 08/16/2015  . Recurrent cold sores 01/20/2016  . Varicose vein of leg 06/17/2017  . Primary insomnia 06/17/2017  . Cervical herniated disc 06/17/2017  . ADD (attention deficit disorder) 03/05/2018  . Memory changes 03/05/2018  . Word finding difficulty 03/05/2018  . Elevated LDL cholesterol level 03/07/2018  . Depression, major, single episode, moderate ([place]) 08/02/2018  . Grief reaction with prolonged  bereavement 08/02/2018  . Stress at work 08/07/2018  . Muscle spasm 03/18/2019  . Acute stress reaction 03/18/2019  . Immunization reaction 01/01/2020   Resolved Ambulatory Problems    Diagnosis Date Noted  . No Resolved Ambulatory Problems   Past Medical History:  Diagnosis Date  . Anxiety   . Hyperlipidemia   . Thyroid disease    Reviewed allergy, problem, med list.   Observations/Objective: No acute distress.  Patching red rash on arms.  No labored breathing.  No swollen appearance to tongue or lips.  No cough.   .. Today's Vitals   01/01/20 1419  Weight: 158 lb (71.7 kg)  Height: 5\' 8"  (1.727 m)   Body mass index is 24.02 kg/m.    Assessment and Plan: Marland KitchenMarland KitchenDiagnoses and all orders for this visit:  Adverse effect of vaccine, initial encounter -     predniSONE (DELTASONE) 20 MG tablet; Take 3 tablets for 3 days, take 2 tablets for 3 days, take 1 tablet for 3 days, take 1/2 tablet for 4 days. -     COMPLETE METABOLIC PANEL WITH GFR  Rash and nonspecific skin eruption -     predniSONE (DELTASONE) 20 MG tablet; Take 3 tablets for 3 days, take 2 tablets for 3 days, take 1 tablet for 3 days, take 1/2 tablet for 4 days.  Itching -     predniSONE (DELTASONE) 20 MG tablet; Take 3 tablets for 3 days, take 2 tablets for 3 days, take 1 tablet for 3 days, take 1/2 tablet for 4  days. -     hydrOXYzine (VISTARIL) 25 MG capsule; Take 1 capsule (25 mg total) by mouth 3 (three) times daily as needed.   I do not advise you to get the next shot. Placed on allergy list. Could consider J and J shot in future. Started prednisone taper and visatril for itching. Stop benadryl. Seems stable virtually today. Concern for mild gullian barre with neuro symptoms but symptoms do not seem to be progressing and seem to improve with anti-histamine. Will get cmp. Continue to monitor symptoms. I think prednisone will help with inflammation/itchiness and rash. Stay home through end of this week.     Follow Up Instructions:    I discussed the assessment and treatment plan with the patient. The patient was provided an opportunity to ask questions and all were answered. The patient agreed with the plan and demonstrated an understanding of the instructions.   The patient was advised to call back or seek an in-person evaluation if the symptoms worsen or if the condition fails to improve as anticipated.  I provided 20 minutes of non-face-to-face time during this encounter.   Iran Planas, PA-C

## 2020-01-03 NOTE — Progress Notes (Signed)
Jacqueline Hughes,   Labs look great.   [name]

## 2020-01-05 ENCOUNTER — Encounter: Payer: Self-pay | Admitting: Physician Assistant

## 2020-01-05 LAB — NOVEL CORONAVIRUS, NAA: SARS-CoV-2, NAA: NOT DETECTED

## 2020-01-06 NOTE — Telephone Encounter (Signed)
Forwarding to provider for recommendation.   

## 2020-01-15 ENCOUNTER — Other Ambulatory Visit: Payer: Self-pay

## 2020-01-15 ENCOUNTER — Ambulatory Visit (INDEPENDENT_AMBULATORY_CARE_PROVIDER_SITE_OTHER): Payer: No Typology Code available for payment source | Admitting: Physician Assistant

## 2020-01-15 VITALS — BP 120/83 | HR 99 | Ht 68.0 in | Wt 160.0 lb

## 2020-01-15 DIAGNOSIS — R21 Rash and other nonspecific skin eruption: Secondary | ICD-10-CM | POA: Diagnosis not present

## 2020-01-15 DIAGNOSIS — T50Z95D Adverse effect of other vaccines and biological substances, subsequent encounter: Secondary | ICD-10-CM

## 2020-01-15 DIAGNOSIS — L299 Pruritus, unspecified: Secondary | ICD-10-CM

## 2020-01-15 DIAGNOSIS — R232 Flushing: Secondary | ICD-10-CM

## 2020-01-15 DIAGNOSIS — F909 Attention-deficit hyperactivity disorder, unspecified type: Secondary | ICD-10-CM

## 2020-01-15 MED ORDER — AMPHETAMINE-DEXTROAMPHET ER 15 MG PO CP24: 15.0000 mg | ORAL_CAPSULE | ORAL | 0 refills | Status: DC

## 2020-01-15 MED ORDER — CETIRIZINE HCL 10 MG PO TABS: 10.0000 mg | ORAL_TABLET | Freq: Every day | ORAL | 1 refills | Status: DC

## 2020-01-15 MED ORDER — FAMOTIDINE 20 MG PO TABS: 20.0000 mg | ORAL_TABLET | Freq: Two times a day (BID) | ORAL | 0 refills | Status: DC

## 2020-01-15 MED FILL — ADDERALL XR 15 MG CAP SA: 15 | 30 days supply | Qty: 30 | Fill #0

## 2020-01-15 NOTE — Progress Notes (Signed)
l °

## 2020-01-15 NOTE — Patient Instructions (Signed)
Rash, Adult A rash is a change in the color of your skin. A rash can also change the way your skin feels. There are many different conditions and factors that can cause a rash. Some rashes may disappear after a few days, but some may last for a few weeks. Common causes of rashes include:  Viral infections, such as: ? Colds. ? Measles. ? Hand, foot, and mouth disease.  Bacterial infections, such as: ? Scarlet fever. ? Impetigo.  Fungal infections, such as Candida.  Allergic reactions to food, medicines, or skin care products. Follow these instructions at home: The goal of treatment is to stop the itching and keep the rash from spreading. Pay attention to any changes in your symptoms. Follow these instructions to help with your condition: Medicine Take or apply over-the-counter and prescription medicines only as told by your health care provider. These may include:  Corticosteroid creams to treat red or swollen skin.  Anti-itch lotions.  Oral allergy medicines (antihistamines).  Oral corticosteroids for severe symptoms.  Skin care  Apply cool compresses to the affected areas.  Do not scratch or rub your skin.  Avoid covering the rash. Make sure the rash is exposed to air as much as possible. Managing itching and discomfort  Avoid hot showers or baths, which can make itching worse. A cold shower may help.  Try taking a bath with: ? Epsom salts. Follow manufacturer instructions on the packaging. You can get these at your local pharmacy or grocery store. ? Baking soda. Pour a small amount into the bath as told by your health care provider. ? Colloidal oatmeal. Follow manufacturer instructions on the packaging. You can get this at your local pharmacy or grocery store.  Try applying baking soda paste to your skin. Stir water into baking soda until it reaches a paste-like consistency.  Try applying calamine lotion. This is an over-the-counter lotion that helps to relieve  itchiness.  Keep cool and out of the sun. Sweating and being hot can make itching worse. General instructions   Rest as needed.  Drink enough fluid to keep your urine pale yellow.  Wear loose-fitting clothing.  Avoid scented soaps, detergents, and perfumes. Use gentle soaps, detergents, perfumes, and other cosmetic products.  Avoid any substance that causes your rash. Keep a journal to help track what causes your rash. Write down: ? What you eat. ? What cosmetic products you use. ? What you drink. ? What you wear. This includes jewelry.  Keep all follow-up visits as told by your health care provider. This is important. Contact a health care provider if:  You sweat at night.  You lose weight.  You urinate more than normal.  You urinate less than normal, or you notice that your urine is a darker color than usual.  You feel weak.  You vomit.  Your skin or the whites of your eyes look yellow (jaundice).  Your skin: ? Tingles. ? Is numb.  Your rash: ? Does not go away after several days. ? Gets worse.  You are: ? Unusually thirsty. ? More tired than normal.  You have: ? New symptoms. ? Pain in your abdomen. ? A fever. ? Diarrhea. Get help right away if you:  Have a fever and your symptoms suddenly get worse.  Develop confusion.  Have a severe headache or a stiff neck.  Have severe joint pains or stiffness.  Have a seizure.  Develop a rash that covers all or most of your body. The rash may   or may not be painful.  Develop blisters that: ? Are on top of the rash. ? Grow larger or grow together. ? Are painful. ? Are inside your nose or mouth.  Develop a rash that: ? Looks like purple pinprick-sized spots all over your body. ? Has a "bull's eye" or looks like a target. ? Is not related to sun exposure, is red and painful, and causes your skin to peel. Summary  A rash is a change in the color of your skin. Some rashes disappear after a few days,  but some may last for a few weeks.  The goal of treatment is to stop the itching and keep the rash from spreading.  Take or apply over-the-counter and prescription medicines only as told by your health care provider.  Contact a health care provider if you have new or worsening symptoms.  Keep all follow-up visits as told by your health care provider. This is important. This information is not intended to replace advice given to you by your health care provider. Make sure you discuss any questions you have with your health care provider. Document Revised: 02/08/2019 Document Reviewed: 05/21/2018 Elsevier Patient Education  2020 [employer]  

## 2020-01-17 ENCOUNTER — Encounter: Payer: Self-pay | Admitting: Physician Assistant

## 2020-01-17 DIAGNOSIS — R232 Flushing: Secondary | ICD-10-CM | POA: Insufficient documentation

## 2020-01-17 DIAGNOSIS — L299 Pruritus, unspecified: Secondary | ICD-10-CM | POA: Insufficient documentation

## 2020-01-17 DIAGNOSIS — Z9882 Breast implant status: Secondary | ICD-10-CM | POA: Insufficient documentation

## 2020-01-17 DIAGNOSIS — R21 Rash and other nonspecific skin eruption: Secondary | ICD-10-CM | POA: Insufficient documentation

## 2020-01-17 NOTE — Progress Notes (Signed)
Subjective:    Patient ID: Jacqueline Hughes, female    DOB: September 19, 1966, 54 y.o.   MRN: [id number]  HPI  Pt is a 54 yo female who presents to the clinic with ongoing rash, itchy throat, itchy skin after 2/27 covid vaccine reaction. Pt had a true reaction with hives and itching starting within 15 minutes of vaccine. EMS came but did not need to go to hospital as she never had any SOB or compromised breathing. Rash and itching was so bad she had a virtual appt on 01/01/2020. She was given prednisone pack and hydroxizine. This did significantly improve her symptoms. Now she is getting intermittent scratchy throat, hives and tiny red papules, skin itching for no reason. Her boss sent her home today due to symptoms. She had taken benadryl and seems to help some. No tongue, throat, lip swelling or problems breathing.   While patient is here she would like her adderall refilled. No problems or concerns. Doing well on dose. No insomnia or increase in anxiety. No palpitations.   .. Active Ambulatory Problems    Diagnosis Date Noted  . Adult ADHD 08/25/2014  . Depression 08/25/2014  . Vitamin D deficiency 08/25/2014  . Dyslexia 08/25/2014  . Food intolerance 08/29/2014  . Osteopenia 08/16/2015  . Recurrent cold sores 01/20/2016  . Varicose vein of leg 06/17/2017  . Primary insomnia 06/17/2017  . Cervical herniated disc 06/17/2017  . ADD (attention deficit disorder) 03/05/2018  . Memory changes 03/05/2018  . Word finding difficulty 03/05/2018  . Elevated LDL cholesterol level 03/07/2018  . Depression, major, single episode, moderate ([place]) 08/02/2018  . Grief reaction with prolonged bereavement 08/02/2018  . Stress at work 08/07/2018  . Muscle spasm 03/18/2019  . Acute stress reaction 03/18/2019  . Immunization reaction 01/01/2020  . S/P bilateral breast implants 01/17/2020  . Itching 01/17/2020  . Rash 01/17/2020  . Flushing 01/17/2020   Resolved Ambulatory Problems    Diagnosis Date Noted  .  No Resolved Ambulatory Problems   Past Medical History:  Diagnosis Date  . Anxiety   . Hyperlipidemia   . Thyroid disease      Review of Systems See HPI.     Objective:   Physical Exam Vitals reviewed.  Constitutional:      Appearance: Normal appearance.  HENT:     Head: Normocephalic.     Nose: Nose normal.     Mouth/Throat:     Mouth: Mucous membranes are moist.     Pharynx: No posterior oropharyngeal erythema.  Eyes:     Extraocular Movements: Extraocular movements intact.     Conjunctiva/sclera: Conjunctivae normal.  Cardiovascular:     Rate and Rhythm: Normal rate and regular rhythm.     Pulses: Normal pulses.  Pulmonary:     Effort: Pulmonary effort is normal.     Breath sounds: Normal breath sounds.  Skin:    Comments: Flushing of upper chest and neck.  Some appearance of whelps on bilateral forearms-seemingly like disappearing.  Scant small pinpoint erythematous papules on bilateral arms.  No rash on chest or legs.   Neurological:     General: No focal deficit present.     Mental Status: She is alert and oriented to person, place, and time.  Psychiatric:        Mood and Affect: Mood normal.           Assessment & Plan:  Marland KitchenMarland KitchenFaith was seen today for skin problem.  Diagnoses and all orders for this  visit:  Adverse effect of vaccine, subsequent encounter -     cetirizine (ZYRTEC) 10 MG tablet; Take 1 tablet (10 mg total) by mouth daily. -     famotidine (PEPCID) 20 MG tablet; Take 1 tablet (20 mg total) by mouth 2 (two) times daily.  Adult ADHD -     amphetamine-dextroamphetamine (ADDERALL XR) 15 MG 24 hr capsule; Take 1 capsule by mouth every morning. -     amphetamine-dextroamphetamine (ADDERALL XR) 15 MG 24 hr capsule; Take 1 capsule by mouth every morning. -     amphetamine-dextroamphetamine (ADDERALL XR) 15 MG 24 hr capsule; Take 1 capsule by mouth every morning.  Rash -     cetirizine (ZYRTEC) 10 MG tablet; Take 1 tablet (10 mg total) by mouth  daily. -     famotidine (PEPCID) 20 MG tablet; Take 1 tablet (20 mg total) by mouth 2 (two) times daily.  Itching -     cetirizine (ZYRTEC) 10 MG tablet; Take 1 tablet (10 mg total) by mouth daily. -     famotidine (PEPCID) 20 MG tablet; Take 1 tablet (20 mg total) by mouth 2 (two) times daily.  Flushing   Will see if allergy would want to see patient or if there are any other instructions.  For now symptoms although annoying are not comprising breathing or swallowing. She does have epipen and aware of the circumstances in which to use. I would like for patient for the next 3 weeks to start zyrtec daily at bedtime and pepcid twice a day. Follow up mychart and let me know if having any more itching or rash.    Refilled adderall for 3 months.   Spent 35 minutes with patient and coordinating care.

## 2020-01-20 MED FILL — VIT D2 1.25 MG (50,000 UNIT: 1.25 MG | 84 days supply | Qty: 12 | Fill #1

## 2020-02-03 NOTE — Progress Notes (Signed)
Spoke with patient. She states medication is helping. No itching. Has occasional redness that goes away. Will call if worsens.

## 2020-02-03 NOTE — Progress Notes (Signed)
Let patient know that I did speak with allergist and stated our treatment plan was appropriate. How are you doing? Is zyrtec helping?

## 2020-02-03 NOTE — Progress Notes (Signed)
Left message on machine for patient to call back.

## 2020-02-07 ENCOUNTER — Other Ambulatory Visit: Payer: Self-pay | Admitting: Physician Assistant

## 2020-02-07 DIAGNOSIS — L299 Pruritus, unspecified: Secondary | ICD-10-CM

## 2020-02-07 DIAGNOSIS — T50Z95D Adverse effect of other vaccines and biological substances, subsequent encounter: Secondary | ICD-10-CM

## 2020-02-07 DIAGNOSIS — R21 Rash and other nonspecific skin eruption: Secondary | ICD-10-CM

## 2020-02-13 NOTE — Progress Notes (Signed)
Jacqueline Hughes,  I called the patient to schedule and she had a few questions regarding the process of what we do and why would she get this testing. Patient states the best time to call her is around 4 P.M. due to her work.  Can you give her a call tomorrow?  Thanks

## 2020-02-13 NOTE — Telephone Encounter (Signed)
error 

## 2020-02-14 ENCOUNTER — Telehealth: Payer: Self-pay | Admitting: *Deleted

## 2020-02-14 NOTE — Telephone Encounter (Signed)
Called and spoke with the patient and she stated that on 12/28/2019 she received the first [place] vaccine. She stated that within minutes she started to develop hives and rash on her arms, her hands became cold, and her tongue was itchy. They administered Benadryl and after 20 minutes there was no relief so EMS was called. They checked her vitals and sent her home with instruction to continue Benadryl. She stated that she took 2 Benadryl and took a nap and when she woke up the rash seemed better, she then stated that once she was up and moving around the rash/hives came back. She stated that they radiated from her knees to her waist and from her arms to her neck and jaw line. She then stated that the hives/rash went away and 2 days later they came back again. She stated one day when she was at work at the end of her shift she lost feeling in her face. When she got home she took 2 Benadryl and an Asprin for fear that it was either an allergic reaction or a stroke. She states that even to this day her skin is a lot darker from the rash and hives and she is still dealing with them being present. I went over with the patient the process of the Component testing and she seems very hesitant because she is worried that she will have the same long lasting side effects that she is dealing with now. She states that she wants to think about it and will call back. Advised that she could always just see [place] as a new patient first to discuss things more in detail. Patient verbalized understanding and will call back if interested. Patient is not going to get the second Pfizer vaccine at this time.

## 2020-02-17 NOTE — Telephone Encounter (Signed)
She sounds like she definitely needs testing. I hope she calls back.

## 2020-02-18 NOTE — Telephone Encounter (Signed)
Thank you so much Ashleigh.

## 2020-04-01 NOTE — Progress Notes (Signed)
Normal mammogram. Follow up in 1 year.

## 2020-04-01 NOTE — Progress Notes (Signed)
J and J is a NOT a messenger RNA vaccine it is a virus vector vaccine so it is different. Certainly we cannot be 100 percent that it was the protein in the vaccine that you responded adversely too. I do recommend getting vaccine J and J but I would let everyone know previous response and wait at least 30 minutes after vaccine for any severe reactions.

## 2020-04-02 ENCOUNTER — Encounter: Payer: Self-pay | Admitting: Neurology

## 2020-06-03 ENCOUNTER — Ambulatory Visit (INDEPENDENT_AMBULATORY_CARE_PROVIDER_SITE_OTHER): Payer: No Typology Code available for payment source | Admitting: Family Medicine

## 2020-06-03 ENCOUNTER — Encounter: Payer: Self-pay | Admitting: Family Medicine

## 2020-06-03 ENCOUNTER — Other Ambulatory Visit: Payer: Self-pay

## 2020-06-03 VITALS — BP 135/81 | HR 78 | Temp 98.6°F | Ht 68.11 in | Wt 146.2 lb

## 2020-06-03 DIAGNOSIS — R3915 Urgency of urination: Secondary | ICD-10-CM

## 2020-06-03 DIAGNOSIS — R1033 Periumbilical pain: Secondary | ICD-10-CM | POA: Insufficient documentation

## 2020-06-03 MED ORDER — TRAMADOL HCL 50 MG PO TABS: 50.0000 mg | ORAL_TABLET | Freq: Three times a day (TID) | ORAL | 0 refills | Status: DC | PRN

## 2020-06-03 MED ORDER — TRAMADOL HCL 50 MG PO TABS: 50.0000 mg | ORAL_TABLET | Freq: Three times a day (TID) | ORAL | 0 refills | Status: AC | PRN

## 2020-06-03 NOTE — Patient Instructions (Signed)
Have labs completed Follow a bland diet for now.    Bland Diet A bland diet consists of foods that are often soft and do not have a lot of fat, fiber, or extra seasonings. Foods without fat, fiber, or seasoning are easier for the body to digest. They are also less likely to irritate your mouth, throat, stomach, and other parts of your digestive system. A bland diet is sometimes called a BRAT diet. What is my plan? Your health care provider or food and nutrition specialist (dietitian) may recommend specific changes to your diet to prevent symptoms or to treat your symptoms. These changes may include:  Eating small meals often.  Cooking food until it is soft enough to chew easily.  Chewing your food well.  Drinking fluids slowly.  Not eating foods that are very spicy, sour, or fatty.  Not eating citrus fruits, such as oranges and grapefruit. What do I need to know about this diet?  Eat a variety of foods from the bland diet food list.  Do not follow a bland diet longer than needed.  Ask your health care provider whether you should take vitamins or supplements. What foods can I eat? Grains  Hot cereals, such as cream of wheat. Rice. Bread, crackers, or tortillas made from refined white flour. Vegetables Canned or cooked vegetables. Mashed or boiled potatoes. Fruits  Bananas. Applesauce. Other types of cooked or canned fruit with the skin and seeds removed, such as canned peaches or pears. Meats and other proteins  Scrambled eggs. Creamy peanut butter or other nut butters. Lean, well-cooked meats, such as chicken or fish. Tofu. Soups or broths. Dairy Low-fat dairy products, such as milk, cottage cheese, or yogurt. Beverages  Water. Herbal tea. Apple juice. Fats and oils Mild salad dressings. Canola or olive oil. Sweets and desserts Pudding. Custard. Fruit gelatin. Ice cream. The items listed above may not be a complete list of recommended foods and beverages. Contact a  dietitian for more options. What foods are not recommended? Grains Whole grain breads and cereals. Vegetables Raw vegetables. Fruits Raw fruits, especially citrus, berries, or dried fruits. Dairy Whole fat dairy foods. Beverages Caffeinated drinks. Alcohol. Seasonings and condiments Strongly flavored seasonings or condiments. Hot sauce. Salsa. Other foods Spicy foods. Fried foods. Sour foods, such as pickled or fermented foods. Foods with high sugar content. Foods high in fiber. The items listed above may not be a complete list of foods and beverages to avoid. Contact a dietitian for more information. Summary  A bland diet consists of foods that are often soft and do not have a lot of fat, fiber, or extra seasonings.  Foods without fat, fiber, or seasoning are easier for the body to digest.  Check with your health care provider to see how long you should follow this diet plan. It is not meant to be followed for long periods. This information is not intended to replace advice given to you by your health care provider. Make sure you discuss any questions you have with your health care provider. Document Revised: 11/15/2017 Document Reviewed: 11/15/2017 Elsevier Patient Education  2020 [employer].

## 2020-06-03 NOTE — Progress Notes (Signed)
Jacqueline Hughes - 54 y.o. female MRN [id number]  Date of birth: February 06, 1966  Subjective Chief Complaint  Patient presents with  . Abdominal Pain    HPI Jacqueline Hughes is a 54 y.o. female here today with complaint of abdominal pain.  Her symptoms started a few days ago.  Pain is located in epigastric and periumbilical area.  She describes pain as sharp, stabbing pain.  Pain is worse with food and laying down.  She has had difficulty sleeping due to pain. She also has noted stool changes.  States that there is increased mucus in her stools and they have foul smell.  She does also admit to some urinary urgency as well.  She denies blood in her stool, fever, chills, nausea or vomiting, body aches.    ROS:  A comprehensive ROS was completed and negative except as noted per HPI  Allergies  Allergen Reactions  . E-Mycin [Erythromycin]   . Lexapro [Escitalopram Oxalate]     Confused and disoriented.   . Covid-19 Mrna Vaccine Therapist, music) [Covid-19 Mrna Vacc (Moderna)] Rash    Started on prednisone last night- vaccine on Saturday    Past Medical History:  Diagnosis Date  . ADD (attention deficit disorder)   . Anxiety   . Depression   . Hyperlipidemia   . Osteopenia   . Thyroid disease   . Vitamin D deficiency     Past Surgical History:  Procedure Laterality Date  . ABDOMINAL HYSTERECTOMY    . AUGMENTATION MAMMAPLASTY    . BREAST SURGERY    . LAPAROTOMY      Social History   Socioeconomic History  . Marital status: Married    Spouse name: Not on file  . Number of children: Not on file  . Years of education: Not on file  . Highest education level: Not on file  Occupational History  . Not on file  Tobacco Use  . Smoking status: Former Smoker    Quit date: 08/25/2004    Years since quitting: 15.7  . Smokeless tobacco: Never Used  Vaping Use  . Vaping Use: Never used  Substance and Sexual Activity  . Alcohol use: Yes    Comment: social  . Drug use: No  . Sexual activity: Yes     Partners: Male  Other Topics Concern  . Not on file  Social History Narrative  . Not on file   Social Determinants of Health   Financial Resource Strain:   . Difficulty of Paying Living Expenses:   Food Insecurity:   . Worried About Charity fundraiser in the Last Year:   . Arboriculturist in the Last Year:   Transportation Needs:   . Film/video editor (Medical):   [name] Lack of Transportation (Non-Medical):   Physical Activity:   . Days of Exercise per Week:   . Minutes of Exercise per Session:   Stress:   . Feeling of Stress :   Social Connections:   . Frequency of Communication with Friends and Family:   . Frequency of Social Gatherings with Friends and Family:   . Attends Religious Services:   . Active Member of Clubs or Organizations:   . Attends Archivist Meetings:   [name] Marital Status:     Family History  Problem Relation Age of Onset  . Alcoholism Other        granparents  . Depression Other        aunts    Health  Maintenance  Topic Date Due  . Hepatitis C Screening  Never done  . HIV Screening  Never done  . COVID-19 Vaccine (2 - Pfizer 2-dose series) 01/18/2020  . MAMMOGRAM  03/07/2020  . INFLUENZA VACCINE  05/31/2020  . COLONOSCOPY  01/14/2021 (Originally 05/08/2017)  . PAP SMEAR-Modifier  09/16/2036 (Originally 05/08/1988)  . TETANUS/TDAP  08/30/2026     ----------------------------------------------------------------------------------------------------------------------------------------------------------------------------------------------------------------- Physical Exam BP 135/81 (BP Location: Left Arm, Patient Position: Sitting, Cuff Size: Normal)   Pulse 78   Temp 98.6 F (37 C) (Oral)   Ht 5' 8.11" (1.73 m)   Wt 146 lb 3.2 oz (66.3 kg)   SpO2 100%   BMI 22.16 kg/m   Physical Exam Constitutional:      Appearance: Normal appearance. She is well-developed.  HENT:     Head: Normocephalic and atraumatic.  Eyes:     General: No  scleral icterus. Cardiovascular:     Rate and Rhythm: Normal rate and regular rhythm.  Pulmonary:     Effort: Pulmonary effort is normal.     Breath sounds: Normal breath sounds.  Abdominal:     General: Abdomen is flat. There is no distension.     Palpations: Abdomen is soft.     Tenderness: There is abdominal tenderness (periumbilical and RUQ). There is no right CVA tenderness or left CVA tenderness.  Musculoskeletal:     Cervical back: Neck supple.  Neurological:     General: No focal deficit present.     Mental Status: She is alert.  Psychiatric:        Mood and Affect: Mood normal.        Behavior: Behavior normal.     ------------------------------------------------------------------------------------------------------------------------------------------------------------------------------------------------------------------- Assessment and Plan  Periumbilical pain Pain is periumbilical but she is also tender over RUQ Check cmp, cbc and lipase today. May need to proceed with imaging depending on what lab work shows.   Having some urinary urgency check UA as well.  Temporary tramadol for pain control as she is not sleeping due to pain.   Discussed that if pain continues to worsen of she develops new symptoms she should be evaluated emergently.     Meds ordered this encounter  Medications  . DISCONTD: traMADol (ULTRAM) 50 MG tablet    Sig: Take 1 tablet (50 mg total) by mouth every 8 (eight) hours as needed for up to 3 days.    Dispense:  10 tablet    Refill:  0  . traMADol (ULTRAM) 50 MG tablet    Sig: Take 1 tablet (50 mg total) by mouth every 8 (eight) hours as needed for up to 3 days.    Dispense:  10 tablet    Refill:  0    No follow-ups on file.    This visit occurred during the SARS-CoV-2 public health emergency.  Safety protocols were in place, including screening questions prior to the visit, additional usage of staff PPE, and extensive cleaning of exam  room while observing appropriate contact time as indicated for disinfecting solutions.

## 2020-06-03 NOTE — Assessment & Plan Note (Addendum)
Pain is periumbilical but she is also tender over RUQ Check cmp, cbc and lipase today. May need to proceed with imaging depending on what lab work shows.   Having some urinary urgency check UA as well.  Temporary tramadol for pain control as she is not sleeping due to pain.   Discussed that if pain continues to worsen of she develops new symptoms she should be evaluated emergently.

## 2020-06-06 MED FILL — traMADol HCL 50 MG TABS: 50 | 3 days supply | Qty: 10 | Fill #0

## 2020-06-07 LAB — COMPLETE METABOLIC PANEL WITH GFR
AG Ratio: 1.6 (calc) (ref 1.0–2.5)
ALT: 10 U/L (ref 6–29)
AST: 17 U/L (ref 10–35)
Albumin: 4.4 g/dL (ref 3.6–5.1)
Alkaline phosphatase (APISO): 51 U/L (ref 37–153)
BUN: 8 mg/dL (ref 7–25)
CO2: 32 mmol/L (ref 20–32)
Calcium: 9.4 mg/dL (ref 8.6–10.4)
Chloride: 104 mmol/L (ref 98–110)
Creat: 0.61 mg/dL (ref 0.50–1.05)
GFR, Est African American: 120 mL/min/{1.73_m2} (ref >=60)
GFR, Est Non African American: 104 mL/min/{1.73_m2} (ref >=60)
Globulin: 2.7 g/dL (calc) (ref 1.9–3.7)
Glucose, Bld: 92 mg/dL (ref 65–139)
Potassium: 4 mmol/L (ref 3.5–5.3)
Sodium: 141 mmol/L (ref 135–146)
Total Bilirubin: 0.5 mg/dL (ref 0.2–1.2)
Total Protein: 7.1 g/dL (ref 6.1–8.1)

## 2020-06-07 LAB — LIPASE: Lipase: 10 U/L (ref 7–60)

## 2020-06-07 LAB — URINALYSIS, ROUTINE W REFLEX MICROSCOPIC
Bilirubin Urine: NEGATIVE
Glucose, UA: NEGATIVE
Hgb urine dipstick: NEGATIVE
Ketones, ur: NEGATIVE
Leukocytes,Ua: NEGATIVE
Nitrite: NEGATIVE
Protein, ur: NEGATIVE
Specific Gravity, Urine: 1.005 (ref 1.001–1.03)
pH: 6.5 (ref 5.0–8.0)

## 2020-06-07 LAB — STOOL CULTURE
MICRO NUMBER:: 10788391
MICRO NUMBER:: 10788392
MICRO NUMBER:: 10788393
SHIGA RESULT:: NOT DETECTED
SPECIMEN QUALITY:: ADEQUATE
SPECIMEN QUALITY:: ADEQUATE
SPECIMEN QUALITY:: ADEQUATE

## 2020-06-07 LAB — CBC
HCT: 42.7 % (ref 35.0–45.0)
Hemoglobin: 13.7 g/dL (ref 11.7–15.5)
MCH: 30 pg (ref 27.0–33.0)
MCHC: 32.1 g/dL (ref 32.0–36.0)
MCV: 93.4 fL (ref 80.0–100.0)
MPV: 9.6 fL (ref 7.5–12.5)
Platelets: 332 10*3/uL (ref 140–400)
RBC: 4.57 10*6/uL (ref 3.80–5.10)
RDW: 12.6 % (ref 11.0–15.0)
WBC: 7.9 10*3/uL (ref 3.8–10.8)

## 2020-06-07 LAB — URINE CULTURE
MICRO NUMBER:: 10787882
SPECIMEN QUALITY:: ADEQUATE

## 2020-08-06 ENCOUNTER — Ambulatory Visit (INDEPENDENT_AMBULATORY_CARE_PROVIDER_SITE_OTHER): Payer: No Typology Code available for payment source | Admitting: Medical-Surgical

## 2020-08-06 ENCOUNTER — Telehealth: Payer: Self-pay

## 2020-08-06 ENCOUNTER — Other Ambulatory Visit: Payer: Self-pay

## 2020-08-06 ENCOUNTER — Ambulatory Visit (INDEPENDENT_AMBULATORY_CARE_PROVIDER_SITE_OTHER): Payer: No Typology Code available for payment source

## 2020-08-06 ENCOUNTER — Encounter: Payer: Self-pay | Admitting: Medical-Surgical

## 2020-08-06 VITALS — BP 114/75 | HR 90 | Temp 98.1°F | Ht 68.0 in | Wt 151.4 lb

## 2020-08-06 DIAGNOSIS — M79672 Pain in left foot: Secondary | ICD-10-CM

## 2020-08-06 MED ORDER — HYDROCODONE-ACETAMINOPHEN 5-325 MG PO TABS: 1.0000 | ORAL_TABLET | Freq: Three times a day (TID) | ORAL | 0 refills | Status: AC | PRN

## 2020-08-06 MED ORDER — CRUTCHES-ALUMINUM MISC: 1.0000 | 0 refills | Status: DC

## 2020-08-06 NOTE — Telephone Encounter (Signed)
-----   Message from Samuel Bouche, NP sent at 08/06/2020  2:54 PM EDT ----- Left foot x-ray showing a fractured sesamoid bone (an extra bone that some people have in the foot). Recommendation is to wear the post-op shoe and to remain non-weight bearing. Treat pain with Ibuprofen, Norco, ice and elevation. If she will come back to our office, we can give her a pair of crutches so she doesn't put weight on it. If she would rather use a medical supply store, I can fax a prescription. Will just need to know where to fax it to.

## 2020-08-06 NOTE — Progress Notes (Signed)
Subjective:    CC: left foot pain  HPI: Pleasant 54 year old female presenting today with reports of tripping and falling while at a car dealership approximately 2 weeks ago.  She was able to drive home and noticed that her left foot was throbbing just a little on the way.  She got home that night and went to bed without any issues.  The next morning she got up and rode her new motorcycle to her mom's house.  When she got off of the motorcycle, she noticed that her foot was very painful and she had difficulty walking.  This lasted the rest of the day.  That evening she noticed that her foot was swollen and still very painful with pressure on the medial front of her foot. 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  She noted that her pain had lessened somewhat but had not resolved.  Yesterday she stepped normally onto her foot going after her dogs and the pain returned to full force.  Today she reports her left leg is hurting from the hip down because she has been walking on her lateral foot due to pain.  She is wearing Converse low top tennis shoes today that are slide ons and reports these are the only shoes she has been able to tolerate.  When she has her shoe on, there is no swelling but it starts as soon as she removes the shoe. Notes the pain is sharp with any touching or pressing but otherwise is throbbing.  She has been doing ice intermittently and using Motrin with minimal relief.  I reviewed the past medical history, family history, social history, surgical history, and allergies today and no changes were needed.  Please see the problem list section below in epic for further details.  Past Medical History: Past Medical History:  Diagnosis Date  . ADD (attention deficit disorder)   . Anxiety   . Depression   . Hyperlipidemia   . Osteopenia   . Thyroid disease   . Vitamin D deficiency    Past Surgical History: Past Surgical  History:  Procedure Laterality Date  . ABDOMINAL HYSTERECTOMY    . AUGMENTATION MAMMAPLASTY    . BREAST SURGERY    . LAPAROTOMY     Social History: Social History   Socioeconomic History  . Marital status: Married    Spouse name: Not on file  . Number of children: Not on file  . Years of education: Not on file  . Highest education level: Not on file  Occupational History  . Not on file  Tobacco Use  . Smoking status: Former Smoker    Quit date: 08/25/2004    Years since quitting: 15.9  . Smokeless tobacco: Never Used  Vaping Use  . Vaping Use: Never used  Substance and Sexual Activity  . Alcohol use: Yes    Comment: social  . Drug use: No  . Sexual activity: Yes    Partners: Male  Other Topics Concern  . Not on file  Social History Narrative  . Not on file   Social Determinants of Health   Financial Resource Strain:   . Difficulty of Paying Living Expenses: Not on file  Food Insecurity:   . Worried About Charity fundraiser in the Last Year: Not on file  . Ran Out of Food in the Last Year: Not on file  Transportation Needs:   . Lack of Transportation (  Medical): Not on file  . Lack of Transportation (Non-Medical): Not on file  Physical Activity:   . Days of Exercise per Week: Not on file  . Minutes of Exercise per Session: Not on file  Stress:   . Feeling of Stress : Not on file  Social Connections:   . Frequency of Communication with Friends and Family: Not on file  . Frequency of Social Gatherings with Friends and Family: Not on file  . Attends Religious Services: Not on file  . Active Member of Clubs or Organizations: Not on file  . Attends Archivist Meetings: Not on file  . Marital Status: Not on file   Family History: Family History  Problem Relation Age of Onset  . Alcoholism Other        granparents  . Depression Other        aunts   Allergies: Allergies  Allergen Reactions  . E-Mycin [Erythromycin]   . Lexapro [Escitalopram  Oxalate]     Confused and disoriented.   . Covid-19 Mrna Vaccine Therapist, music) [Covid-19 Mrna Vacc (Moderna)] Rash    Started on prednisone last night- vaccine on Saturday   Medications: See med rec.  Review of Systems: See HPI for pertinent positives and negatives.   Objective:    General: Well Developed, well nourished, and in no acute distress.  Neuro: Alert and oriented x3.  HEENT: Normocephalic, atraumatic.  Skin: Warm and dry. Cardiac: Regular rate and rhythm, no murmurs rubs or gallops, no lower extremity edema.  Respiratory: Clear to auscultation bilaterally. Not using accessory muscles, speaking in full sentences. Left foot: Full range of motion to the left ankle.  No appreciable swelling or erythema.  Able to move toes independently however dorsiflexion of the great toe is painful.  Point tenderness on the plantar aspect of the MTP joint that does not radiate up the foot.  Impression and Recommendations:    1. Left foot pain Left foot x-rays showing sesamoid fracture at the base of the great toe.  Postop shoe provided during appointment.  Patient returned for crutches.  Remain nonweightbearing to the left foot until follow-up with Dr. Darene Lamer next week.  Continue ibuprofen 600 mg every 6 hours as needed for pain.  Continue elevation and cold compresses.  Hydrocodone-APAP  5-325 mg 1 tablet every 8 hours as needed for pain.  Okay to return to work but will need to remain nonweightbearing by using crutches or sitting, preferably with foot elevated.  If unable to tolerate postop shoe, okay to take off but be sure to avoid weightbearing in this case. - DG Foot Complete Left; Future  Return in about 1 week (around 08/13/2020) for left foot fracture follow up with Dr. Darene Lamer.. ___________________________________________ Clearnce Sorrel, DNP, APRN, FNP-BC [facility] [place]

## 2020-08-06 NOTE — Telephone Encounter (Signed)
Prescription signed and sent to [facility]. If she has any trouble, we can print the prescription and fax it to them.   Clearnce Sorrel, DNP, APRN, FNP-BC [facility]

## 2020-08-06 NOTE — Telephone Encounter (Signed)
Spoke with pt and she said that she would go to [facility] and get the cruthes. Rx for crutches tee'd up below ready for review and approval/denial.

## 2020-08-10 ENCOUNTER — Encounter (HOSPITAL_BASED_OUTPATIENT_CLINIC_OR_DEPARTMENT_OTHER): Payer: Self-pay

## 2020-08-10 ENCOUNTER — Emergency Department (HOSPITAL_BASED_OUTPATIENT_CLINIC_OR_DEPARTMENT_OTHER)
Admission: EM | Admit: 2020-08-10 | Discharge: 2020-08-10 | Disposition: A | Discharge status: Home / Self Care | Payer: No Typology Code available for payment source | Attending: Emergency Medicine | Admitting: Emergency Medicine

## 2020-08-10 ENCOUNTER — Telehealth: Payer: Self-pay | Admitting: Emergency Medicine

## 2020-08-10 ENCOUNTER — Other Ambulatory Visit: Payer: Self-pay

## 2020-08-10 DIAGNOSIS — M79672 Pain in left foot: Secondary | ICD-10-CM | POA: Insufficient documentation

## 2020-08-10 DIAGNOSIS — Z87891 Personal history of nicotine dependence: Secondary | ICD-10-CM | POA: Insufficient documentation

## 2020-08-10 DIAGNOSIS — R202 Paresthesia of skin: Secondary | ICD-10-CM | POA: Insufficient documentation

## 2020-08-10 MED ORDER — RIVAROXABAN 15 MG PO TABS
15.0000 mg | ORAL_TABLET | Freq: Once | ORAL | Status: AC
  Administered 2020-08-10: 15 mg via ORAL
  Filled 2020-08-10: qty 1

## 2020-08-10 NOTE — ED Provider Notes (Signed)
[facility] EMERGENCY DEPARTMENT Provider Note   CSN: [id number] Arrival date & time: 08/10/20  1951     History Chief Complaint  Patient presents with  . Foot Pain    Jacqueline Hughes is a 54 y.o. female.  Patient presents the emergency department for left leg tingling and soreness as well as worsening swelling in her left foot.  Patient states that she had an injury to her foot about 2 weeks ago.  She had x-ray last week which demonstrated a possible sesamoid bone fracture.  She was given crutches and a postop shoe.  He has been taking over-the-counter medication and hydrocodone for pain.  She has sports medicine follow-up in 4 days.  Today the patient noted increased swelling along the bottom of her foot despite not bearing weight.  She also noted a decrease in temperature on her left leg.  No color changes.  She was advised to come to the emergency department to rule out compartment syndrome.  She has had some pain from her back that radiates down into her left leg.  She reports decreased sensation in her left lower extremity.  No other treatments prior to arrival.  She reports having a brother who had a lower extremity fracture last year and had multiple blood clots subsequently and is currently on a blood thinner.  Patient herself does not have a clotting disorder.        Past Medical History:  Diagnosis Date  . ADD (attention deficit disorder)   . Anxiety   . Depression   . Hyperlipidemia   . Osteopenia   . Thyroid disease   . Vitamin D deficiency     Patient Active Problem List   Diagnosis Date Noted  . Periumbilical pain 97/35/3299  . S/P bilateral breast implants 01/17/2020  . Itching 01/17/2020  . Rash 01/17/2020  . Flushing 01/17/2020  . Immunization reaction 01/01/2020  . Muscle spasm 03/18/2019  . Acute stress reaction 03/18/2019  . Stress at work 08/07/2018  . Depression, major, single episode, moderate ([place]) 08/02/2018  . Grief reaction with  prolonged bereavement 08/02/2018  . Elevated LDL cholesterol level 03/07/2018  . ADD (attention deficit disorder) 03/05/2018  . Memory changes 03/05/2018  . Word finding difficulty 03/05/2018  . Varicose vein of leg 06/17/2017  . Primary insomnia 06/17/2017  . Cervical herniated disc 06/17/2017  . Recurrent cold sores 01/20/2016  . Osteopenia 08/16/2015  . Food intolerance 08/29/2014  . Adult ADHD 08/25/2014  . Depression 08/25/2014  . Vitamin D deficiency 08/25/2014  . Dyslexia 08/25/2014    Past Surgical History:  Procedure Laterality Date  . ABDOMINAL HYSTERECTOMY    . AUGMENTATION MAMMAPLASTY    . BREAST SURGERY    . LAPAROTOMY       OB History   No obstetric history on file.     Family History  Problem Relation Age of Onset  . Alcoholism Other        granparents  . Depression Other        aunts    Social History   Tobacco Use  . Smoking status: Former Smoker    Quit date: 08/25/2004    Years since quitting: 15.9  . Smokeless tobacco: Never Used  Vaping Use  . Vaping Use: Never used  Substance Use Topics  . Alcohol use: Yes    Comment: social  . Drug use: No    Home Medications Prior to Admission medications   Medication Sig Start Date End Date  Taking? Authorizing Provider  ALPRAZolam Duanne Moron) 0.5 MG tablet Take 1 tablet (0.5 mg total) by mouth at bedtime as needed for anxiety. 03/18/19   Breeback, Jade L, PA-C  amphetamine-dextroamphetamine (ADDERALL XR) 15 MG 24 hr capsule Take 1 capsule by mouth every morning. 01/15/20   Breeback, Jade L, PA-C  amphetamine-dextroamphetamine (ADDERALL XR) 15 MG 24 hr capsule Take 1 capsule by mouth every morning. 02/15/20   Breeback, Jade L, PA-C  amphetamine-dextroamphetamine (ADDERALL XR) 15 MG 24 hr capsule Take 1 capsule by mouth every morning. 03/16/20   Breeback, Royetta Car, PA-C  cyclobenzaprine (FLEXERIL) 10 MG tablet Take 1 tablet (10 mg total) by mouth 3 (three) times daily as needed for muscle spasms. 03/18/19    Breeback, Jade L, PA-C  eszopiclone (LUNESTA) 2 MG TABS tablet Take 1 tablet (2 mg total) by mouth at bedtime as needed for sleep. Take immediately before bedtime 10/04/19   Breeback, Jade L, PA-C  FLUoxetine (PROZAC) 20 MG tablet Take 1 tablet (20 mg total) by mouth daily. 10/07/19   Breeback, Royetta Car, PA-C  HYDROcodone-acetaminophen (NORCO/VICODIN) 5-325 MG tablet Take 1 tablet by mouth every 8 (eight) hours as needed for up to 5 days for moderate pain. 08/06/20 08/11/20  Samuel Bouche, NP  [place]. Devices (CRUTCHES-ALUMINUM) MISC 1 Device by Does not apply route as directed. 08/06/20   Samuel Bouche, NP  Vitamin D, Ergocalciferol, (DRISDOL) 1.25 MG (50000 UT) CAPS capsule Take 1 capsule (50,000 Units total) by mouth every 7 (seven) days. 10/04/19   Donella Stade, PA-C    Allergies    E-mycin [erythromycin], Lexapro [escitalopram oxalate], and Covid-19 mrna vaccine (pfizer) [covid-19 mrna vacc (moderna)]  Review of Systems   Review of Systems  Cardiovascular: Negative for leg swelling.  Musculoskeletal: Positive for joint swelling and myalgias.  Skin: Negative for color change.  Neurological: Positive for numbness (paresthesia).    Physical Exam Updated Vital Signs BP 137/76 (BP Location: Right Arm)   Pulse 87   Temp 98.5 F (36.9 C) (Oral)   Resp 18   SpO2 100%   Physical Exam Vitals and nursing note reviewed.  Constitutional:      Appearance: She is well-developed.  HENT:     Head: Normocephalic and atraumatic.  Eyes:     Pupils: Pupils are equal, round, and reactive to light.  Cardiovascular:     Pulses: Normal pulses. No decreased pulses.  Musculoskeletal:        General: No tenderness.     Cervical back: Normal range of motion and neck supple.     Comments: Left lower extremity: 2+ DP and PT pulses readily palpated.  Skin color is normal, without difference when compared to right.  Skin temperature is slightly decreased on the left.  Sensation intact, however patient reports  decreased compared to the right.  No signs of cellulitis.  There is edema of the right foot with point tenderness along the plantar surface at the base of the big toe.  Skin:    General: Skin is warm and dry.  Neurological:     Mental Status: She is alert.     Sensory: No sensory deficit.     Comments: Motor, sensation, and vascular distal to the injury is fully intact.      ED Results / Procedures / Treatments   Labs (all labs ordered are listed, but only abnormal results are displayed) Labs Reviewed - No data to display  EKG None  Radiology No results found.  Procedures Procedures (  including critical care time)  Medications Ordered in ED Medications  Rivaroxaban (XARELTO) tablet 15 mg (has no administration in time range)    ED Course  I have reviewed the triage vital signs and the nursing notes.  Pertinent labs & imaging results that were available during my care of the patient were reviewed by me and considered in my medical decision making (see chart for details).  Patient seen and examined.  Patient has a reassuring exam without signs of compartment syndrome.  She has readily palpated pulses and no signs of acute arterial occlusion.   Question as to whether patient has a DVT.  I feel that her exam is low risk, however she should be evaluated.  We do not have ultrasound at this facility tonight or tomorrow.  Will work on scheduling her an appointment at [facility] for tomorrow.  RN is facilitating this.  Given her brothers history and patient's recent immobility, will give a dose of Xarelto here to help protect her overnight.  Currently no signs of PE.  Patient looks well.  Vital signs reviewed and are as follows: BP 137/76 (BP Location: Right Arm)   Pulse 87   Temp 98.5 F (36.9 C) (Oral)   Resp 18   SpO2 100%     MDM Rules/Calculators/A&P                          Patient with left foot swelling consistent with recent injury.  Paresthesias and soreness in the  leg will need evaluation with ultrasound which will be obtained tomorrow.  Do not feel that patient needs emergent referral or transfer, although we cannot get this at [facility] or [facility] at this hour either.  She looks well.  No signs of compartment syndrome or arterial occlusion.  Plan as above.  She has orthopedic follow-up on 10/15.    Final Clinical Impression(s) / ED Diagnoses Final diagnoses:  Left foot pain  Numbness and tingling of left leg    Rx / DC Orders ED Discharge Orders         Ordered    VAS [place] LOWER EXTREMITY VENOUS (DVT)        08/10/20 2158           Carlisle Cater, PA-C 08/10/20 Am[place], [place], DO 08/10/20 2247

## 2020-08-10 NOTE — Telephone Encounter (Signed)
Call from patient who is a patient of primary care regarding her cold leg - pt had a foot fracture, has been using crutches - pt had attempted to call primary care - hung up before she got the answering service - pt directed to go to ER per provider Lavell Anchors, NP) & to call primary care next door en route to ER. Pt verbalized an understanding.

## 2020-08-10 NOTE — ED Triage Notes (Addendum)
Pt states she was dx with fx to left foot 10/7 with ortho f/u 10/15-initial injury was ~2weeks prior to imaging-pt wearing velcro post op type shoe and has crutches-states she is having pain/swelling to left foot and "change in temperature" to left LE-NAD-to triage in w/c

## 2020-08-10 NOTE — ED Notes (Signed)
Pt given directions and phone number to [facility] Radiology to call in am at [phone] to schedule appt for doppler tomorrow. Pt verbalized to call here is any questions about scheduling. Instructed [place] order is for tomorrow.

## 2020-08-10 NOTE — Discharge Instructions (Signed)
Please read and follow all provided instructions.  Your diagnoses today include:  1. Left foot pain   2. Numbness and tingling of left leg     Tests performed today include: Ultrasound scheduled at [facility] for tomorrow morning Vital signs. See below for your results today.   Medications prescribed:   Take any prescribed medications only as directed.  Additional Information:  Follow any educational materials contained in this packet.  Although you appear stable for discharge, you may still have a blood clot in your leg(s) called a DVT (deep venous thrombosis). You may have received initial treatment with an injection of a blood thinner to treat DVTs, but low risk patients do not always get treated before an ultrasound is performed to diagnose or rule out a DVT, due to the risks of bleeding from the medication used. It is important you follow up for an ultrasound within one day as directed.  Follow-up instructions: Please return to the hospital tomorrow morning for your ultrasound as directed.  Please follow-up with your primary care provider in the next 1-2 days for further evaluation of your symptoms.   Return instructions:  Please return to the Emergency Department if you experience worsening symptoms. Return immediately if you develop chest pain, shortness of breath, dizziness or fainting. Return with new color change or weakness/numbness to your affected leg(s). Return with bleeding, severe headache, confusion or altered mental status. Please return if you have any other emergent concerns.  Additional Information:  Your vital signs today were: BP 137/76 (BP Location: Right Arm)    Pulse 87    Temp 98.5 F (36.9 C) (Oral)    Resp 18    SpO2 100%  If your blood pressure (BP) was elevated above 135/85 this visit, please have this repeated by your doctor within one month. --------------

## 2020-08-11 ENCOUNTER — Other Ambulatory Visit: Payer: Self-pay

## 2020-08-11 ENCOUNTER — Ambulatory Visit (HOSPITAL_COMMUNITY)
Admission: RE | Admit: 2020-08-11 | Discharge: 2020-08-11 | Disposition: A | Discharge status: Home / Self Care | Payer: No Typology Code available for payment source | Source: Ambulatory Visit | Attending: Emergency Medicine | Admitting: Emergency Medicine

## 2020-08-11 DIAGNOSIS — M79609 Pain in unspecified limb: Secondary | ICD-10-CM | POA: Diagnosis not present

## 2020-08-11 DIAGNOSIS — Z87828 Personal history of other (healed) physical injury and trauma: Secondary | ICD-10-CM | POA: Diagnosis not present

## 2020-08-11 DIAGNOSIS — R202 Paresthesia of skin: Secondary | ICD-10-CM | POA: Insufficient documentation

## 2020-08-11 NOTE — Progress Notes (Signed)
Left lower extremity venous duplex has been completed. Preliminary results can be found in CV Proc through chart review.    08/11/20 1:56 PM Jacqueline Hughes RVT

## 2020-08-14 ENCOUNTER — Ambulatory Visit (INDEPENDENT_AMBULATORY_CARE_PROVIDER_SITE_OTHER): Payer: No Typology Code available for payment source | Admitting: Sports Medicine

## 2020-08-14 ENCOUNTER — Other Ambulatory Visit: Payer: Self-pay

## 2020-08-14 ENCOUNTER — Ambulatory Visit (INDEPENDENT_AMBULATORY_CARE_PROVIDER_SITE_OTHER): Payer: No Typology Code available for payment source

## 2020-08-14 DIAGNOSIS — S92812A Other fracture of left foot, initial encounter for closed fracture: Secondary | ICD-10-CM

## 2020-08-14 MED ORDER — HYDROCODONE-ACETAMINOPHEN 5-325 MG PO TABS: 1.0000 | ORAL_TABLET | Freq: Three times a day (TID) | ORAL | 0 refills | Status: DC | PRN

## 2020-08-14 NOTE — Assessment & Plan Note (Addendum)
I think Jacqueline Hughes has a true medial sesamoid fracture. Continue postop shoe, we did switch her to a smaller size that was more appropriate for her. Dancers pad placed to unload her sesamoid. Adding hydrocodone, updated x-rays. Return to see me in 3 weeks.

## 2020-08-14 NOTE — Progress Notes (Signed)
    Procedures performed today:    None.  Independent interpretation of notes and tests performed by another provider:   X-rays personally reviewed, there does appear to be an acute fracture with minimal distraction, left medial sesamoid.  Brief History, Exam, Impression, and Recommendations:    Closed fracture of sesamoid bone of left foot I think Jacqueline Hughes has a true medial sesamoid fracture. Continue postop shoe, we did switch her to a smaller size that was more appropriate for her. Dancers pad placed to unload her sesamoid. Adding hydrocodone, updated x-rays. Return to see me in 3 weeks.    ___________________________________________ Gwen Her. Dianah Field, M.D., ABFM., CAQSM. Primary Care and [place] Instructor of [place] of [facility] of Medicine

## 2020-09-02 ENCOUNTER — Ambulatory Visit (INDEPENDENT_AMBULATORY_CARE_PROVIDER_SITE_OTHER): Payer: No Typology Code available for payment source

## 2020-09-02 ENCOUNTER — Ambulatory Visit (INDEPENDENT_AMBULATORY_CARE_PROVIDER_SITE_OTHER): Payer: No Typology Code available for payment source | Admitting: Sports Medicine

## 2020-09-02 ENCOUNTER — Other Ambulatory Visit: Payer: Self-pay

## 2020-09-02 DIAGNOSIS — S92812D Other fracture of left foot, subsequent encounter for fracture with routine healing: Secondary | ICD-10-CM | POA: Diagnosis not present

## 2020-09-02 NOTE — Progress Notes (Signed)
    Procedures performed today:    None.  Independent interpretation of notes and tests performed by another provider:   None.  Brief History, Exam, Impression, and Recommendations:    Closed fracture of sesamoid bone of left foot Jacqueline Hughes is status post medial sesamoid fracture, she was doing well until a recent misstep, now she has increasing pain, exam is for the most part unchanged, she actually does well in a postop shoe without crutches. 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  We will keep an eye on this. Okay to discontinue crutches around the house, may continue use her knee scooter at work. Repeating x-rays, return to see me in a month.    ___________________________________________ Jacqueline Her. Dianah Field, M.D., ABFM., CAQSM. Primary Care and [place] Instructor of [place] of [facility] of Medicine

## 2020-09-02 NOTE — Assessment & Plan Note (Addendum)
Jacqueline Hughes is status post medial sesamoid fracture, she was doing well until a recent misstep, now she has increasing pain, exam is for the most part unchanged, she actually does well in a postop shoe without crutches. 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  We will keep an eye on this. Okay to discontinue crutches around the house, may continue use her knee scooter at work. Repeating x-rays, return to see me in a month.

## 2020-09-11 ENCOUNTER — Ambulatory Visit: Payer: No Typology Code available for payment source | Admitting: Sports Medicine

## 2020-09-30 ENCOUNTER — Ambulatory Visit (INDEPENDENT_AMBULATORY_CARE_PROVIDER_SITE_OTHER): Payer: No Typology Code available for payment source | Admitting: Sports Medicine

## 2020-09-30 ENCOUNTER — Other Ambulatory Visit: Payer: Self-pay

## 2020-09-30 DIAGNOSIS — S92812D Other fracture of left foot, subsequent encounter for fracture with routine healing: Secondary | ICD-10-CM

## 2020-09-30 NOTE — Assessment & Plan Note (Signed)
Jacqueline Hughes returns, she is a pleasant 54 year old female post medial sesamoid fracture, approximately 6 to 8 weeks ago, she continues to do well, recent x-rays showed some resorption of the fracture, she has been in a postop shoe, today has no pain over the fracture site itself, I think she can discontinue the postop shoe and switch to regular shoes, ideally supportive athletic type shoes, with footwear around the house, no further barefoot walking. If does well in a month she does not need to see me again.

## 2020-09-30 NOTE — Progress Notes (Signed)
    Procedures performed today:    None.  Independent interpretation of notes and tests performed by another provider:   None.  Brief History, Exam, Impression, and Recommendations:    Closed fracture of sesamoid bone of left foot Jacqueline Hughes returns, she is a pleasant 54 year old female post medial sesamoid fracture, approximately 6 to 8 weeks ago, she continues to do well, recent x-rays showed some resorption of the fracture, she has been in a postop shoe, today has no pain over the fracture site itself, I think she can discontinue the postop shoe and switch to regular shoes, ideally supportive athletic type shoes, with footwear around the house, no further barefoot walking. If does well in a month she does not need to see me again.    ___________________________________________ Gwen Her. Dianah Field, M.D., ABFM., CAQSM. Primary Care and [place] Instructor of [place] of [facility] of Medicine

## 2020-10-05 ENCOUNTER — Encounter: Payer: Self-pay | Admitting: Physician Assistant

## 2020-10-06 ENCOUNTER — Encounter: Payer: Self-pay | Admitting: Nurse Practitioner

## 2020-10-06 ENCOUNTER — Telehealth (INDEPENDENT_AMBULATORY_CARE_PROVIDER_SITE_OTHER): Payer: No Typology Code available for payment source | Admitting: Nurse Practitioner

## 2020-10-06 DIAGNOSIS — J40 Bronchitis, not specified as acute or chronic: Secondary | ICD-10-CM | POA: Diagnosis not present

## 2020-10-06 DIAGNOSIS — J329 Chronic sinusitis, unspecified: Secondary | ICD-10-CM

## 2020-10-06 MED ORDER — PREDNISONE 20 MG PO TABS: 20.0000 mg | ORAL_TABLET | Freq: Every day | ORAL | 0 refills | Status: AC

## 2020-10-06 MED ORDER — HYDROCODONE-HOMATROPINE 5-1.5 MG/5ML PO SYRP: 5.0000 mL | ORAL_SOLUTION | Freq: Every evening | ORAL | 0 refills | Status: DC | PRN

## 2020-10-06 MED ORDER — AMOXICILLIN-POT CLAVULANATE 875-125 MG PO TABS: 1.0000 | ORAL_TABLET | Freq: Two times a day (BID) | ORAL | 0 refills | Status: DC

## 2020-10-06 NOTE — Patient Instructions (Addendum)
The following information is provided as a Human resources officer for ADULT patients only and does NOT take into account PREGNANCY, ALLERGIES, LIVER CONDITIONS, KIDNEY CONDITIONS, GASTROINTESTINAL CONDITIONS, OR PRESCRIPTION MEDICATION INTERACTIONS. Please be sure to ask your provider if the following are safe to take with your specific medical history, conditions, or current medication regimen if you are unsure.   Adult Basic Symptom Management for Sinusitis  Congestion: Guaifenesin (Mucinex)- follow directions on packaging with a maximum dose of 2400mg  in a 24 hour period. Sudafed (must be purchased behind the pharmacy counter) follow directions on the box.   Pain/Fever: Ibuprofen 200mg  - 400mg  every 4-6 hours as needed (MAX 1200mg  in a 24 hour period) Pain/Fever: Tylenol 500mg  -1000mg  every 6-8 hours as needed (MAX 3000mg  in a 24 hour period)  Cough: Dextromethorphan (Delsym)- follow directions on packing with a maximum dose of 120mg  in a 24 hour period.  Nasal Stuffiness: Saline nasal spray and/or Nettie Pot with sterile saline solution  Runny Nose: Fluticasone nasal spray (Flonase) OR Mometasone nasal spray (Nasonex) OR Triamcinolone Acetonide nasal spray (Nasacort)- follow directions on the packaging  Pain/Pressure: Warm washcloth to the face  Sore Throat: Warm salt water gargles, warn tea, honey, lozenges  If you have allergies, you may also consider taking an oral antihistamine (like Zyrtec or Claritin) as these may also help with your symptoms.   **Many medications will have more than one ingredient, be sure you are reading the packaging carefully and not taking more than one dose of the same kind of medication at the same time or too close together. It is OK to use formulas that have all of the ingredients you want, but do not take them in a combined medication and as separate dose too close together. If you have any questions, the pharmacist will be happy to help you decide what is  safe.

## 2020-10-06 NOTE — Progress Notes (Signed)
Virtual Video Visit via MyChart Note  I connected with  Jacqueline Hughes on 10/06/20 at  1:10 PM EST by the video enabled telemedicine application for , MyChart, and verified that I am speaking with the correct person using two identifiers.   I introduced myself as a Designer, jewellery with the practice. We discussed the limitations of evaluation and management by telemedicine and the availability of in person appointments. The patient expressed understanding and agreed to proceed.  Participating parties in this visit include: The patient and the nurse practitioner listed The patient is: at home I am: in the office  Subjective:    CC:  Chief Complaint  Patient presents with  . URI    onset 5 days ago, HA, hoarseness, congestion, muscle aches, nausea, eye drainage, ST, runny nose, has been taking Tylenol and Motrin with minimal relief    HPI: Jacqueline Hughes is a 54 y.o. y/o female presenting via MyChart today for flu-like symptoms that have been present for about the past 5 days.   She tells me last Thursday her symptoms started with headache, body aches, and severe fatigue. She reports the sinus congestion came later.   She endorses headache, sore throat, hoarseness/laryngitis, sinus congestion, sinus pain, body aches, nausea, watery eyes, rhinorrhea, chest tightness, cervical lymph node enlargement, green mucous production with coughing.  She also reports feeling hot and sweaty, but not feverish.   She denies shortness of breath, dizziness, chest pain, wheezing, nausea, vomiting, diarrhea, loss of taste, or loss of smell.   She has been taking tylenol and motrin with little relief of symptoms.   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   She had the [place] vaccine on November 11. She has an appointment for [facility] testing this afternoon at [facility].    Past medical history, Surgical history, Family history not pertinant except as noted below, Social  history, Allergies, and medications have been entered into the medical record, reviewed, and corrections made.   Review of Systems:  All review of systems negative except what is listed in the HPI   Objective:    General: Laryngitis present with audible cough.  Audible congestion.  No apparent distress or shortness of breath noted.   Alert and oriented x3.   Normal judgment.  No apparent acute distress.   Impression and Recommendations:    1. Sinobronchitis Symptoms and presentation consistent with sinobronchitis. Unable to rule out possible underlying COVID infection, therefore recommend keeping COVID test this afternoon.  I feel that given the length of time symptoms have been present, it is reasonable to begin treatment for sinusitis.  Prednisone provided for chest tightness and hycodan for cough not well controlled with OTC medications.  Recommend Mucinex, alternating tylenol and ibuprofen, Flonase nasal spray, increased hydration, increased rest, and humidified air for symptom management.  Work note provided detailing the criteria for return to work in the event of positive and negative COVID test.  Recommend quarantine until results have been received and notifying the office of results.  Warning signs such as shortness of breath, dizziness, chest pain, palpitations, or feeling as though you cannot get a good breath reviewed and when to seek emergency care.  Follow-up if symptoms worsen or fail to improve.  - amoxicillin-clavulanate (AUGMENTIN) 875-125 MG tablet; Take 1 tablet by mouth 2 (two) times daily.  Dispense: 10 tablet; Refill: 0 - predniSONE (DELTASONE) 20 MG tablet; Take 1 tablet (20 mg total) by  mouth daily with breakfast for 5 days.  Dispense: 5 tablet; Refill: 0 - HYDROcodone-homatropine (HYCODAN) 5-1.5 MG/5ML syrup; Take 5 mLs by mouth at bedtime as needed for cough. May take up to 3 times a day (every 8 hours) for severe symptoms when not driving.  Dispense: 120 mL;  Refill: 0     I discussed the assessment and treatment plan with the patient. The patient was provided an opportunity to ask questions and all were answered. The patient agreed with the plan and demonstrated an understanding of the instructions.   The patient was advised to call back or seek an in-person evaluation if the symptoms worsen or if the condition fails to improve as anticipated.  I provided 20 minutes of non-face-to-face interaction with this [place] visit including intake, same-day documentation, and chart review.   Orma Render, NP

## 2021-03-01 ENCOUNTER — Telehealth: Payer: No Typology Code available for payment source | Admitting: Physician Assistant

## 2021-03-08 ENCOUNTER — Telehealth (INDEPENDENT_AMBULATORY_CARE_PROVIDER_SITE_OTHER): Payer: No Typology Code available for payment source | Admitting: Physician Assistant

## 2021-03-08 ENCOUNTER — Other Ambulatory Visit (HOSPITAL_COMMUNITY): Payer: Self-pay

## 2021-03-08 DIAGNOSIS — F331 Major depressive disorder, recurrent, moderate: Secondary | ICD-10-CM

## 2021-03-08 DIAGNOSIS — F909 Attention-deficit hyperactivity disorder, unspecified type: Secondary | ICD-10-CM

## 2021-03-08 DIAGNOSIS — F4329 Adjustment disorder with other symptoms: Secondary | ICD-10-CM

## 2021-03-08 MED ORDER — AMPHETAMINE-DEXTROAMPHET ER 15 MG PO CP24
15.0000 mg | ORAL_CAPSULE | ORAL | 0 refills | Status: DC
  Filled 2021-08-07: qty 30, 30d supply, fill #0

## 2021-03-08 MED ORDER — AMPHETAMINE-DEXTROAMPHET ER 15 MG PO CP24
15.0000 mg | ORAL_CAPSULE | ORAL | 0 refills | Status: DC
  Filled 2021-06-22: qty 30, 30d supply, fill #0

## 2021-03-08 MED ORDER — AMPHETAMINE-DEXTROAMPHET ER 15 MG PO CP24
15.0000 mg | ORAL_CAPSULE | ORAL | 0 refills | Status: DC
  Filled 2021-03-08: qty 30, 30d supply, fill #0

## 2021-03-08 MED ORDER — BUPROPION HCL ER (XL) 150 MG PO TB24
150.0000 mg | ORAL_TABLET | ORAL | 0 refills | Status: DC
  Filled 2021-03-08: qty 90, 90d supply, fill #0

## 2021-03-08 MED ORDER — FLUOXETINE HCL 20 MG PO CAPS
20.0000 mg | ORAL_CAPSULE | Freq: Every day | ORAL | 0 refills | Status: DC
  Filled 2021-03-08: qty 90, 90d supply, fill #0

## 2021-03-08 NOTE — Progress Notes (Signed)
Called @3 :06, no answer.

## 2021-03-08 NOTE — Progress Notes (Signed)
Patient ID: Jacqueline Hughes, female   DOB: 1965/11/29, 55 y.o.   MRN: [id number] .Marland KitchenVirtual Visit via Video Note  I connected with Karinda V Vandemark on 03/10/21 at  3:20 PM EDT by a video enabled telemedicine application and verified that I am speaking with the correct person using two identifiers.  Location: Patient: work Provider: clinic  .Marland KitchenParticipating in visit:  Patient: Teeghan Provider: Iran Planas PA-C Provider in training: Edwina Barth PA-Student   I discussed the limitations of evaluation and management by telemedicine and the availability of in person appointments. The patient expressed understanding and agreed to proceed.  History of Present Illness: Patient is a 55 year old female with ADHD and depression who calls into the clinic for medication refills.  She has been out of her Adderall for quite some time.  She is just not have a lot of motivation to get refills.  This year has been really hard on her.  She is coming up on the anniversary of her granddaughter who they loss.  She is also approaching the 1 year anniversary of her adopted son who was in a murder suicide.  She continues to grieve along with her family.  She continues to hold a job down.  She continues to go to small group.  She continues to hang out with friends.  She does feel supported but she feels like she has no pleasure in doing any of those things.  She is on fluoxetine which has helped her the most in the past.  She worries about switching from anything else.  She denies any personal suicidal thoughts or ideations.   .. Active Ambulatory Problems    Diagnosis Date Noted  . Adult ADHD 08/25/2014  . Depression 08/25/2014  . Vitamin D deficiency 08/25/2014  . Dyslexia 08/25/2014  . Food intolerance 08/29/2014  . Osteopenia 08/16/2015  . Recurrent cold sores 01/20/2016  . Varicose vein of leg 06/17/2017  . Primary insomnia 06/17/2017  . Cervical herniated disc 06/17/2017  . ADD (attention deficit disorder) 03/05/2018   . Memory changes 03/05/2018  . Word finding difficulty 03/05/2018  . Elevated LDL cholesterol level 03/07/2018  . Depression, major, single episode, moderate ([place]) 08/02/2018  . Grief reaction with prolonged bereavement 08/02/2018  . Stress at work 08/07/2018  . Muscle spasm 03/18/2019  . Acute stress reaction 03/18/2019  . Immunization reaction 01/01/2020  . S/P bilateral breast implants 01/17/2020  . Itching 01/17/2020  . Rash 01/17/2020  . Flushing 01/17/2020  . Periumbilical pain 19/14/7829  . Closed fracture of sesamoid bone of left foot 08/14/2020  . Moderate episode of recurrent major depressive disorder ([place]) 03/10/2021   Resolved Ambulatory Problems    Diagnosis Date Noted  . No Resolved Ambulatory Problems   Past Medical History:  Diagnosis Date  . Anxiety   . Hyperlipidemia   . Thyroid disease    Reviewed med, allergy, problem list.  Observations/Objective: No acute distress Flat affect.  .. Depression screen [facility] 2/9 10/04/2019 03/18/2019 07/31/2018 06/11/2018 06/14/2017  Decreased Interest 1 0 2 1 0  Down, Depressed, Hopeless 1 0 3 1 1   PHQ - 2 Score 2 0 5 2 1   Altered sleeping 1 1 2 3  -  Tired, decreased energy 2 1 1 2  -  Change in appetite 1 0 0 3 -  Feeling bad or failure about yourself  1 0 1 0 -  Trouble concentrating 2 3 2 2  -  Moving slowly or fidgety/restless 2 1 1 2  -  Suicidal  thoughts 0 0 0 0 -  PHQ-9 Score 11 6 12 14  -  Difficult doing work/chores Somewhat difficult Somewhat difficult Very difficult - -   .[name] GAD 7 : Generalized Anxiety Score 10/04/2019 03/18/2019 07/31/2018 06/11/2018  Nervous, Anxious, on Edge 1 1 1 3   Control/stop worrying 1 1 1 3   Worry too much - different things 1 1 0 3  Trouble relaxing 1 1 1 3   Restless 1 1 1 3   Easily annoyed or irritable 1 1 3 2   Afraid - awful might happen 1 0 0 2  Total GAD 7 Score 7 6 7 19   Anxiety Difficulty Somewhat difficult Somewhat difficult Somewhat difficult -       Assessment and  Plan: Marland KitchenMarland KitchenFaith was seen today for medication refill.  Diagnoses and all orders for this visit:  Grief reaction with prolonged bereavement -     FLUoxetine (PROZAC) 20 MG capsule; Take 1 capsule (20 mg total) by mouth daily. -     buPROPion (WELLBUTRIN XL) 150 MG 24 hr tablet; Take 1 tablet (150 mg total) by mouth every morning.  Adult ADHD -     amphetamine-dextroamphetamine (ADDERALL XR) 15 MG 24 hr capsule; Take 1 capsule by mouth every morning. -     amphetamine-dextroamphetamine (ADDERALL XR) 15 MG 24 hr capsule; Take 1 capsule by mouth every morning. -     amphetamine-dextroamphetamine (ADDERALL XR) 15 MG 24 hr capsule; Take 1 capsule by mouth every morning.  Moderate episode of recurrent major depressive disorder (HCC) -     FLUoxetine (PROZAC) 20 MG capsule; Take 1 capsule (20 mg total) by mouth daily. -     buPROPion (WELLBUTRIN XL) 150 MG 24 hr tablet; Take 1 tablet (150 mg total) by mouth every morning.  PHQ up from last check.  Patient has been out of her Adderall for quite some time.  She is a Education officer, museum.  She is almost just had no motivation to even follow-up to get medication refills.  She has remained on fluoxetine 20 mg which worked well for a while.  She has had a lot going on over the last year.  She is still struggling with grief and overall sadness.  She just does not have a lot of motivation to do anything.  She is working.  She is going to church.  She is hanging out with friends.  She just finds when she is with them she is not finding any pleasure.  Added Wellbutrin today.  Follow-up in 1 month.  Refilled Adderall.  Continue fluoxetine.    Follow Up Instructions:    I discussed the assessment and treatment plan with the patient. The patient was provided an opportunity to ask questions and all were answered. The patient agreed with the plan and demonstrated an understanding of the instructions.   The patient was advised to call back or seek an in-person evaluation  if the symptoms worsen or if the condition fails to improve as anticipated.    Iran Planas, PA-C

## 2021-03-10 ENCOUNTER — Encounter: Payer: Self-pay | Admitting: Physician Assistant

## 2021-03-10 DIAGNOSIS — F331 Major depressive disorder, recurrent, moderate: Secondary | ICD-10-CM | POA: Insufficient documentation

## 2021-06-22 ENCOUNTER — Other Ambulatory Visit (HOSPITAL_COMMUNITY): Payer: Self-pay

## 2021-06-22 ENCOUNTER — Other Ambulatory Visit: Payer: Self-pay | Admitting: Physician Assistant

## 2021-06-22 DIAGNOSIS — F331 Major depressive disorder, recurrent, moderate: Secondary | ICD-10-CM

## 2021-06-22 DIAGNOSIS — F4329 Adjustment disorder with other symptoms: Secondary | ICD-10-CM

## 2021-06-22 DIAGNOSIS — F4381 Prolonged grief disorder: Secondary | ICD-10-CM

## 2021-06-22 MED ORDER — BUPROPION HCL ER (XL) 150 MG PO TB24
150.0000 mg | ORAL_TABLET | ORAL | 0 refills | Status: DC
  Filled 2021-06-22: qty 30, 30d supply, fill #0

## 2021-06-22 MED ORDER — FLUOXETINE HCL 20 MG PO CAPS
20.0000 mg | ORAL_CAPSULE | Freq: Every day | ORAL | 0 refills | Status: DC
  Filled 2021-06-22: qty 30, 30d supply, fill #0

## 2021-06-30 ENCOUNTER — Encounter: Payer: Self-pay | Admitting: Family Medicine

## 2021-08-07 ENCOUNTER — Other Ambulatory Visit: Payer: Self-pay | Admitting: Physician Assistant

## 2021-08-07 ENCOUNTER — Other Ambulatory Visit (HOSPITAL_COMMUNITY): Payer: Self-pay

## 2021-08-07 DIAGNOSIS — F4381 Prolonged grief disorder: Secondary | ICD-10-CM

## 2021-08-07 DIAGNOSIS — F331 Major depressive disorder, recurrent, moderate: Secondary | ICD-10-CM

## 2021-08-07 DIAGNOSIS — F4329 Adjustment disorder with other symptoms: Secondary | ICD-10-CM

## 2021-08-07 DIAGNOSIS — F909 Attention-deficit hyperactivity disorder, unspecified type: Secondary | ICD-10-CM

## 2021-08-09 ENCOUNTER — Other Ambulatory Visit (HOSPITAL_COMMUNITY): Payer: Self-pay

## 2021-08-09 MED ORDER — AMPHETAMINE-DEXTROAMPHET ER 15 MG PO CP24
15.0000 mg | ORAL_CAPSULE | ORAL | 0 refills | Status: DC
  Filled 2021-08-09: qty 30, 30d supply, fill #0

## 2021-08-09 MED ORDER — FLUOXETINE HCL 20 MG PO CAPS
20.0000 mg | ORAL_CAPSULE | Freq: Every day | ORAL | 0 refills | Status: DC
  Filled 2021-08-09: qty 15, 15d supply, fill #0

## 2021-08-09 NOTE — Telephone Encounter (Signed)
Last written 03/08/2021 - 3 month supply  Last seen in May, was to follow up in one month. Please advise.

## 2021-09-13 ENCOUNTER — Other Ambulatory Visit: Payer: Self-pay | Admitting: Neurology

## 2021-09-13 DIAGNOSIS — F909 Attention-deficit hyperactivity disorder, unspecified type: Secondary | ICD-10-CM

## 2021-09-13 NOTE — Telephone Encounter (Signed)
Patient scheduled for a f/u appt with Jacqueline Hughes on Tuesday 09/21/2021. AM

## 2021-09-13 NOTE — Telephone Encounter (Signed)
Patient called for refills of Adderall. Not seen since May. Can we call and schedule appt for follow up for patient with [name]?

## 2021-09-13 NOTE — Telephone Encounter (Signed)
Please advise on refill before appt next week.

## 2021-09-14 ENCOUNTER — Other Ambulatory Visit (HOSPITAL_COMMUNITY): Payer: Self-pay

## 2021-09-14 MED ORDER — AMPHETAMINE-DEXTROAMPHET ER 15 MG PO CP24
15.0000 mg | ORAL_CAPSULE | ORAL | 0 refills | Status: DC
  Filled 2021-09-14: qty 30, 30d supply, fill #0

## 2021-09-16 IMAGING — DX DG FOOT COMPLETE 3+V*L*
3 series · 3 of 3 positions shown · non-contrast
Comparison: None.

CLINICAL DATA: Sesamoid fracture of left foot

EXAM:
LEFT FOOT - COMPLETE 3+ VIEW

[foot ap]
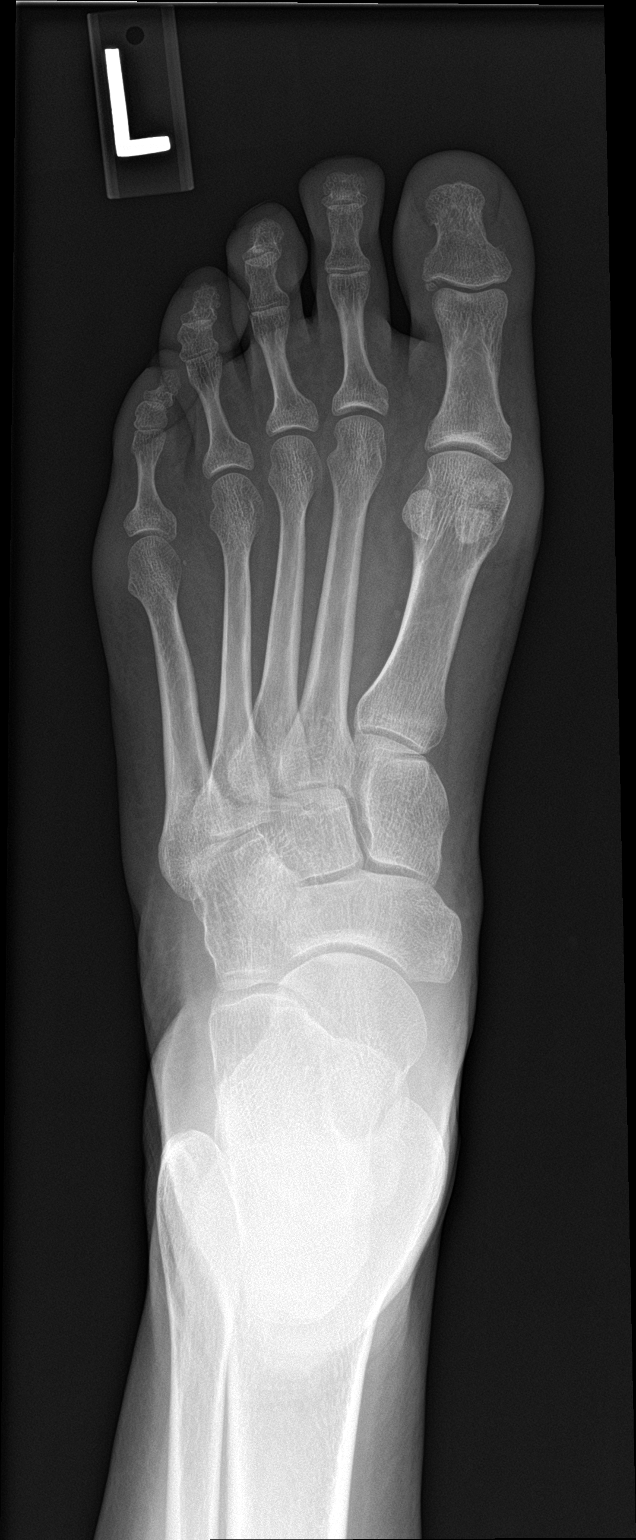

[foot obl]
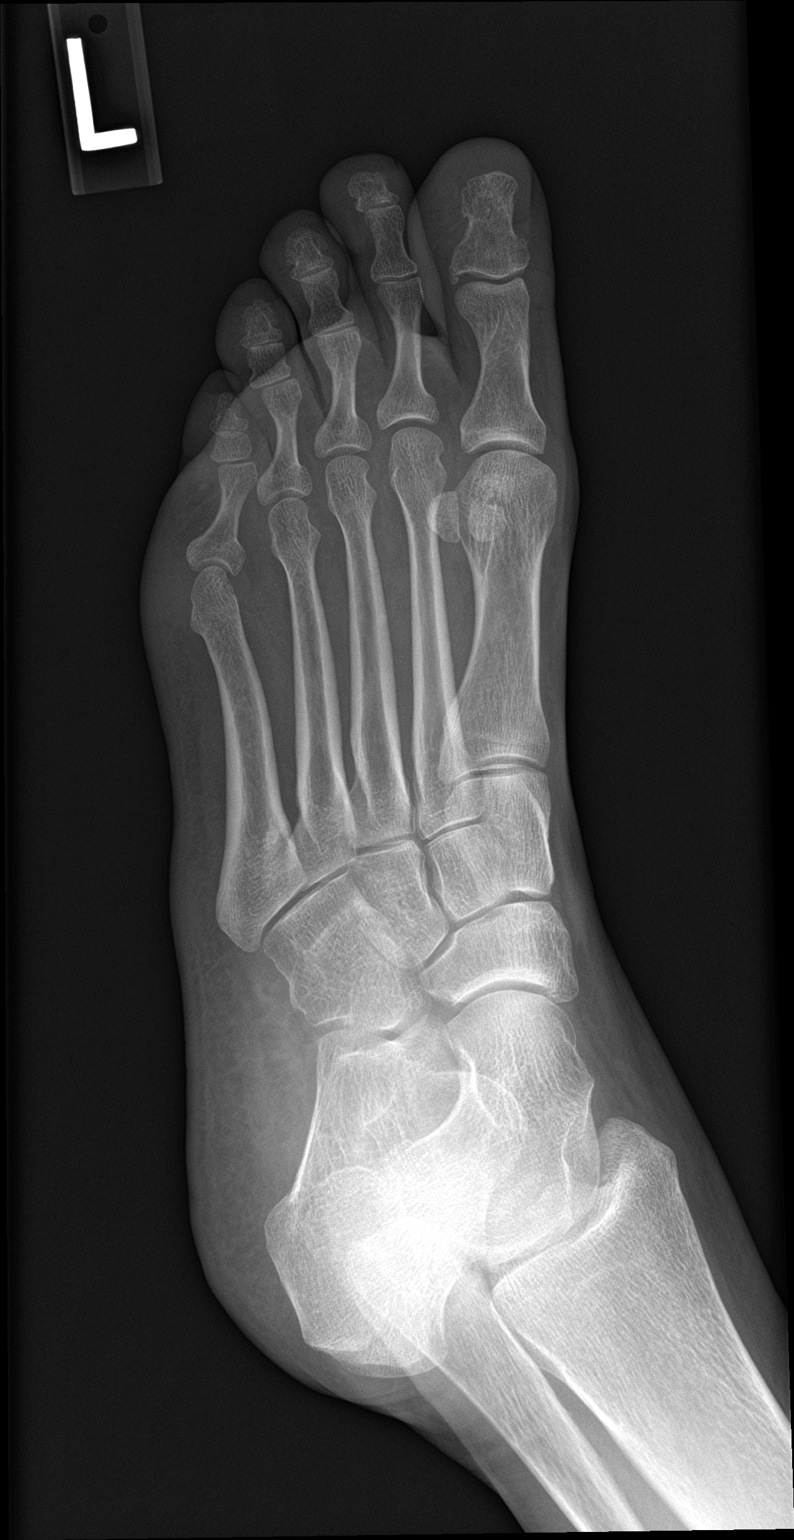

[foot lat]
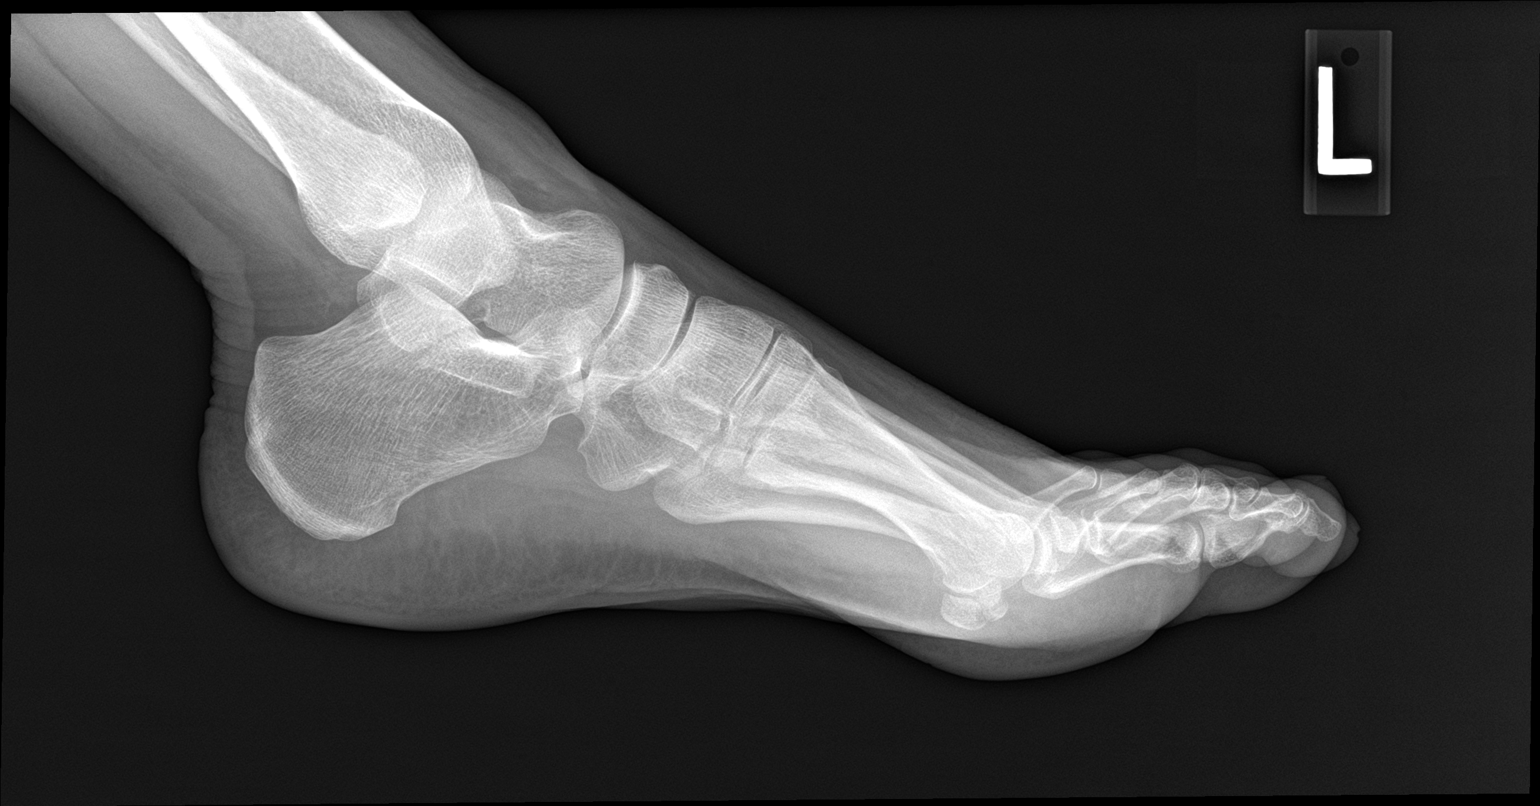

[3 of 3 positions shown; findings below may reference images not displayed]

FINDINGS: There is no evidence of fracture or dislocation. There is no
evidence of arthropathy or other focal bone abnormality. Soft
tissues are unremarkable. There is a tripartite sesamoid bone at the
head of the first metatarsal, unchanged. No periosteal new bone
formation.
IMPRESSION: Unchanged appearance of tripartite sesamoid bone.

## 2021-09-21 ENCOUNTER — Other Ambulatory Visit: Payer: Self-pay

## 2021-09-21 ENCOUNTER — Other Ambulatory Visit (HOSPITAL_COMMUNITY): Payer: Self-pay

## 2021-09-21 ENCOUNTER — Encounter: Payer: Self-pay | Admitting: Physician Assistant

## 2021-09-21 ENCOUNTER — Ambulatory Visit (INDEPENDENT_AMBULATORY_CARE_PROVIDER_SITE_OTHER): Payer: No Typology Code available for payment source | Admitting: Physician Assistant

## 2021-09-21 VITALS — BP 115/65 | HR 77 | Temp 98.2°F | Ht 68.0 in | Wt 151.0 lb

## 2021-09-21 DIAGNOSIS — Z1159 Encounter for screening for other viral diseases: Secondary | ICD-10-CM

## 2021-09-21 DIAGNOSIS — F909 Attention-deficit hyperactivity disorder, unspecified type: Secondary | ICD-10-CM | POA: Diagnosis not present

## 2021-09-21 DIAGNOSIS — F4329 Adjustment disorder with other symptoms: Secondary | ICD-10-CM

## 2021-09-21 DIAGNOSIS — Z13228 Encounter for screening for other metabolic disorders: Secondary | ICD-10-CM

## 2021-09-21 DIAGNOSIS — B001 Herpesviral vesicular dermatitis: Secondary | ICD-10-CM

## 2021-09-21 DIAGNOSIS — Z1211 Encounter for screening for malignant neoplasm of colon: Secondary | ICD-10-CM | POA: Diagnosis not present

## 2021-09-21 DIAGNOSIS — Z1231 Encounter for screening mammogram for malignant neoplasm of breast: Secondary | ICD-10-CM

## 2021-09-21 DIAGNOSIS — E559 Vitamin D deficiency, unspecified: Secondary | ICD-10-CM

## 2021-09-21 DIAGNOSIS — F331 Major depressive disorder, recurrent, moderate: Secondary | ICD-10-CM

## 2021-09-21 DIAGNOSIS — Z0000 Encounter for general adult medical examination without abnormal findings: Secondary | ICD-10-CM | POA: Diagnosis not present

## 2021-09-21 DIAGNOSIS — E7800 Pure hypercholesterolemia, unspecified: Secondary | ICD-10-CM

## 2021-09-21 DIAGNOSIS — Z114 Encounter for screening for human immunodeficiency virus [HIV]: Secondary | ICD-10-CM

## 2021-09-21 DIAGNOSIS — F4381 Prolonged grief disorder: Secondary | ICD-10-CM

## 2021-09-21 MED ORDER — VALACYCLOVIR HCL 1 G PO TABS: 2000.0000 mg | ORAL_TABLET | Freq: Two times a day (BID) | ORAL | 0 refills | Status: DC

## 2021-09-21 MED ORDER — AMPHETAMINE-DEXTROAMPHET ER 15 MG PO CP24: 15.0000 mg | ORAL_CAPSULE | ORAL | 0 refills | Status: DC

## 2021-09-21 MED ORDER — AMPHETAMINE-DEXTROAMPHET ER 15 MG PO CP24
15.0000 mg | ORAL_CAPSULE | ORAL | 0 refills | Status: DC
  Filled 2021-09-21: qty 30, 30d supply, fill #0

## 2021-09-21 MED ORDER — VITAMIN D (ERGOCALCIFEROL) 1.25 MG (50000 UNIT) PO CAPS
50000.0000 [IU] | ORAL_CAPSULE | ORAL | 3 refills | Status: DC
  Filled 2021-09-21: qty 12, 84d supply, fill #0

## 2021-09-21 MED ORDER — FLUOXETINE HCL 20 MG PO CAPS
20.0000 mg | ORAL_CAPSULE | Freq: Every day | ORAL | 3 refills | Status: DC
  Filled 2021-09-21: qty 90, 90d supply, fill #0
  Filled 2022-04-14: qty 90, 90d supply, fill #1

## 2021-09-21 NOTE — Progress Notes (Signed)
Subjective:    Patient ID: Jacqueline Hughes, female    DOB: 01-25-1966, 55 y.o.   MRN: [id number]  HPI 55 y.o female with a history of ADHD, depression, and insomnia presenting to the clinic for medication refills and routine labs. She is currently experiencing a cold sore outbreak and inquired about receiving Valtrex. She additionally inquired about her results of at-home food intolerance testing for her family and herself. We discussed that there is no current medical evidence to support the validity of food intolerance testing. Reported no major changes with mood at today's visit but still reports trouble sleeping.   .. Active Ambulatory Problems    Diagnosis Date Noted   Adult ADHD 08/25/2014   Depression 08/25/2014   Vitamin D deficiency 08/25/2014   Dyslexia 08/25/2014   Food intolerance 08/29/2014   Osteopenia 08/16/2015   Recurrent cold sores 01/20/2016   Varicose vein of leg 06/17/2017   Primary insomnia 06/17/2017   Cervical herniated disc 06/17/2017   ADD (attention deficit disorder) 03/05/2018   Memory changes 03/05/2018   Word finding difficulty 03/05/2018   Elevated LDL cholesterol level 03/07/2018   Depression, major, single episode, moderate ([place]) 08/02/2018   Grief reaction with prolonged bereavement 08/02/2018   Stress at work 08/07/2018   Muscle spasm 03/18/2019   Acute stress reaction 03/18/2019   Immunization reaction 01/01/2020   S/P bilateral breast implants 01/17/2020   Itching 01/17/2020   Rash 01/17/2020   Flushing 56/43/3295   Periumbilical pain 18/84/1660   Closed fracture of sesamoid bone of left foot 08/14/2020   Moderate episode of recurrent major depressive disorder ([place]) 03/10/2021   Resolved Ambulatory Problems    Diagnosis Date Noted   No Resolved Ambulatory Problems   Past Medical History:  Diagnosis Date   Anxiety    Hyperlipidemia    Thyroid disease    . Family History  Problem Relation Age of Onset   Alcoholism Other         granparents   Depression Other        aunts   .[name] Social History   Socioeconomic History   Marital status: Married    Spouse name: Not on file   Number of children: Not on file   Years of education: Not on file   Highest education level: Not on file  Occupational History   Not on file  Tobacco Use   Smoking status: Former    Types: Cigarettes    Quit date: 08/25/2004    Years since quitting: 17.0   Smokeless tobacco: Never  Vaping Use   Vaping Use: Never used  Substance and Sexual Activity   Alcohol use: Yes    Comment: social   Drug use: No   Sexual activity: Yes    Partners: Male  Other Topics Concern   Not on file  Social History Narrative   Not on file   Social Determinants of Health   Financial Resource Strain: Not on file  Food Insecurity: Not on file  Transportation Needs: Not on file  Physical Activity: Not on file  Stress: Not on file  Social Connections: Not on file  Intimate Partner Violence: Not on file      Review of Systems  All other systems reviewed and are negative.     Objective:   Physical Exam Vitals reviewed.  Constitutional:      Appearance: Normal appearance.  HENT:     Head: Normocephalic.     Right Ear: Tympanic membrane, ear canal  and external ear normal. There is no impacted cerumen.     Left Ear: Tympanic membrane, ear canal and external ear normal. There is no impacted cerumen.     Nose: Nose normal.     Mouth/Throat:     Mouth: Mucous membranes are moist.     Pharynx: No posterior oropharyngeal erythema.  Eyes:     Extraocular Movements: Extraocular movements intact.     Conjunctiva/sclera: Conjunctivae normal.     Pupils: Pupils are equal, round, and reactive to light.  Neck:     Vascular: No carotid bruit.  Cardiovascular:     Rate and Rhythm: Normal rate and regular rhythm.  Pulmonary:     Effort: Pulmonary effort is normal.     Breath sounds: Normal breath sounds.  Abdominal:     General: Bowel sounds are  normal. There is no distension.     Palpations: Abdomen is soft. There is no mass.     Tenderness: There is no abdominal tenderness. There is no right CVA tenderness, left CVA tenderness, guarding or rebound.     Hernia: No hernia is present.  Musculoskeletal:     Cervical back: Normal range of motion.     Right lower leg: No edema.     Left lower leg: No edema.  Lymphadenopathy:     Cervical: No cervical adenopathy.  Neurological:     General: No focal deficit present.     Mental Status: She is alert and oriented to person, place, and time.  Psychiatric:        Mood and Affect: Mood normal.      .. Depression screen [facility] 2/9 10/04/2019 03/18/2019 07/31/2018 06/11/2018 06/14/2017  Decreased Interest 1 0 2 1 0  Down, Depressed, Hopeless 1 0 3 1 1   PHQ - 2 Score 2 0 5 2 1   Altered sleeping 1 1 2 3  -  Tired, decreased energy 2 1 1 2  -  Change in appetite 1 0 0 3 -  Feeling bad or failure about yourself  1 0 1 0 -  Trouble concentrating 2 3 2 2  -  Moving slowly or fidgety/restless 2 1 1 2  -  Suicidal thoughts 0 0 0 0 -  PHQ-9 Score 11 6 12 14  -  Difficult doing work/chores Somewhat difficult Somewhat difficult Very difficult - -   .[name] GAD 7 : Generalized Anxiety Score 10/04/2019 03/18/2019 07/31/2018 06/11/2018  Nervous, Anxious, on Edge 1 1 1 3   Control/stop worrying 1 1 1 3   Worry too much - different things 1 1 0 3  Trouble relaxing 1 1 1 3   Restless 1 1 1 3   Easily annoyed or irritable 1 1 3 2   Afraid - awful might happen 1 0 0 2  Total GAD 7 Score 7 6 7 19   Anxiety Difficulty Somewhat difficult Somewhat difficult Somewhat difficult -        Assessment & Plan:  Arturo was seen today for adhd and requesting labwork.  Diagnoses and all orders for this visit:  Adult ADHD -     amphetamine-dextroamphetamine (ADDERALL XR) 15 MG 24 hr capsule; Take 1 capsule by mouth every morning.  Encounter for screening mammogram for malignant neoplasm of breast -     MM DIGITAL SCREENING  BILATERAL; Future  Screen for colon cancer -     Ambulatory referral to Gastroenterology  Vitamin D deficiency -     VITAMIN D 25 Hydroxy (Vit-D Deficiency, Fractures) -     Vitamin D,  Ergocalciferol, (DRISDOL) 1.25 MG (50000 UNIT) CAPS capsule; Take 1 capsule (50,000 Units total) by mouth every 7 (seven) days.  Need for hepatitis C screening test -     Hepatitis C antibody  Screening for HIV (human immunodeficiency virus) -     HIV Antibody (routine testing w rflx)  Healthcare maintenance -     CBC with Differential/Platelet -     COMPLETE METABOLIC PANEL WITH GFR  Elevated LDL cholesterol level -     Lipid panel  Screening for metabolic disorder -     TSH  Grief reaction with prolonged bereavement -     FLUoxetine (PROZAC) 20 MG capsule; Take 1 capsule (20 mg total) by mouth daily.  Moderate episode of recurrent major depressive disorder (HCC) -     FLUoxetine (PROZAC) 20 MG capsule; Take 1 capsule (20 mg total) by mouth daily.  Other orders -     valACYclovir (VALTREX) 1000 MG tablet; Take 2 tablets (2,000 mg total) by mouth 2 (two) times daily for 1 day. For one day as needed for cold sore   Refills provided at today's appointment PHQ and GAD no concerns.  Fasting labs ordered.  Colonoscopy and mammogram ordered.  Valacyclovir provided for current cold sore Pt inquired about receiving flu shot but we recommended she wait until cold sore resolves Hand out provided on Suvorexant for insomnia.  No evidence that hair testing for food sensitivities actually give reliable information.  Follow up in 1 year.

## 2021-09-21 NOTE — Patient Instructions (Signed)
Suvorexant Tablets What is this medication? SUVOREXANT (SOO voe REX ant) treats insomnia. It helps you go to sleep faster and stay asleep through the night. This medicine may be used for other purposes; ask your health care provider or pharmacist if you have questions. COMMON BRAND NAME(S): Belsomra What should I tell my care team before I take this medication? They need to know if you have any of these conditions: Depression History of substance abuse or addiction History of a sudden onset of muscle weakness (cataplexy) History of falling asleep often at unexpected times (narcolepsy) If you often drink alcohol Liver disease Lung or breathing disease Sleep apnea Sleep-walking, driving, eating or other activity while not fully awake after taking a sleep medication Suicidal thoughts, plans, or attempt; a previous suicide attempt by you or a family member An unusual or allergic reaction to suvorexant, other medications, foods, dyes, or preservatives Pregnant or trying to get pregnant Breast-feeding How should I use this medication? Take this medication by mouth with water 30 minutes before going to bed. Follow the directions on the prescription label. It is better to take this medication on an empty stomach. Do not take your medication more often than directed. A special MedGuide will be given to you by the pharmacist with each prescription and refill. Be sure to read this information carefully each time. Talk to your care team about the use of this medication in children. Special care may be needed. Overdosage: If you think you have taken too much of this medicine contact a poison control center or emergency room at once. NOTE: This medicine is only for you. Do not share this medicine with others. What if I miss a dose? This does not apply. This medication should only be taken as directed before going to sleep. Do not take double or extra doses. What may interact with this  medication? Alcohol Antihistamines for allergy, cough, or cold Certain antibiotics, such as erythromycin or clarithromycin Certain antivirals for HIV or hepatitis Certain medications for fungal infections, such as itraconazole, ketoconazole, posaconazole Certain medications for mental health conditions Certain medications for seizures, such as carbamazepine, phenytoin, phenobarbital Conivaptan Diltiazem General anesthetics, such as halothane, isoflurane, methoxyflurane, propofol Grapefruit juice Medications that relax muscles for surgery Opioid medications for pain Other medications for sleep Rifampin St. John's Wort Verapamil This list may not describe all possible interactions. Give your health care provider a list of all the medicines, herbs, non-prescription drugs, or dietary supplements you use. Also tell them if you smoke, drink alcohol, or use illegal drugs. Some items may interact with your medicine. What should I watch for while using this medication? Visit your care team for regular checks on your progress. Keep a regular sleep schedule by going to bed at about the same time each night. Avoid caffeine-containing drinks in the evening hours. Talk to your care team if your insomnia worsens or is not better within 7 to 10 days. After taking this medication, you may get up out of bed and do an activity that you do not know you are doing. The next morning, you may have no memory of this. Activities include driving a car ("sleep-driving"), making and eating food, talking on the phone, sexual activity, and sleep-walking. Serious injuries have occurred. Stop the medication and call your care team right away if you find out you have done any of these activities. Do not take this medication if you have used alcohol that evening. Do not take it if you have taken another   medication for sleep. The risk of doing these sleep-related activities is higher. Do not take this medication unless you are  able to stay in bed for a full night (7 to 8 hours) before you must be active again. Tell your care team if you will need to perform activities requiring full alertness, such as driving, the next day. You may have a decrease in mental alertness the day after use, even if you feel that you are fully awake. Do not stand or sit up quickly after taking this medication, especially if you are an older patient. This reduces the risk of dizzy or fainting spells. If you or your family notice any changes in your moods or behavior, such as new or worsening depression, thoughts of harming yourself, anxiety, other unusual or disturbing thoughts, or memory loss, call your care team right away. After you stop taking this medication, you may have trouble falling asleep. This is called rebound insomnia. This problem usually goes away on its own after 1 or 2 nights. What side effects may I notice from receiving this medication? Side effects that you should report to your care team as soon as possible: Allergic reactions--skin rash, itching, hives, swelling of the face, lips, tongue, or throat CNS depression--slow or shallow breathing, shortness of breath, feeling faint, dizziness, confusion, trouble staying awake Mood and behavior changes--anxiety, nervousness, confusion, hallucinations, irritability, hostility, thoughts of suicide or self-harm, worsening mood, feelings of depression Sudden and temporary muscle weakness Unable to move or speak for several minutes upon waking or going to sleep Unusual sleep behaviors or activities you do not remember, such as driving, eating, or sexual activity Side effects that usually do not require medical attention (report these to your care team if they continue or are bothersome): Drowsiness the day after use Vivid dreams or nightmares This list may not describe all possible side effects. Call your doctor for medical advice about side effects. You may report side effects to FDA at  1-800-FDA-1088. Where should I keep my medication? Keep out of the reach of children and pets. This medication can be abused. Keep it in a safe place to protect it from theft. Do not share it with anyone. It is only for you. Selling or giving away this medication is dangerous and against the law. Store between 20 and 25 degrees C (68 and 77 degrees F). Protect from light and moisture. Keep the container tightly closed. Get rid of any unused medication after the expiration date. This medication may cause harm and death if it is taken by other adults, children, or pets. It is important to get rid of the medication as soon as you no longer need it or it is expired. You can do this in two ways: Take the medication to a medication take-back program. Check with your pharmacy or law enforcement to find a location. If you cannot return the medication, check the label or package insert to see if the medication should be thrown out in the garbage or flushed down the toilet. If you are not sure, ask your care team. If it is safe to put it in the trash, take the medication out of the container. Mix the medication with cat litter, dirt, coffee grounds, or other unwanted substance. Seal the mixture in a bag or container. Put it in the trash. NOTE: This sheet is a summary. It may not cover all possible information. If you have questions about this medicine, talk to your doctor, pharmacist, or health care provider.  2022 [employer] Standard (2021-06-10 00:00:00)

## 2021-09-22 ENCOUNTER — Other Ambulatory Visit: Payer: Self-pay | Admitting: Physician Assistant

## 2021-09-22 LAB — COMPLETE METABOLIC PANEL WITH GFR
AG Ratio: 1.8 (calc) (ref 1.0–2.5)
ALT: 13 U/L (ref 6–29)
AST: 17 U/L (ref 10–35)
Albumin: 4.5 g/dL (ref 3.6–5.1)
Alkaline phosphatase (APISO): 47 U/L (ref 37–153)
BUN: 7 mg/dL (ref 7–25)
CO2: 30 mmol/L (ref 20–32)
Calcium: 9.4 mg/dL (ref 8.6–10.4)
Chloride: 103 mmol/L (ref 98–110)
Creat: 0.69 mg/dL (ref 0.50–1.03)
Globulin: 2.5 g/dL (calc) (ref 1.9–3.7)
Glucose, Bld: 86 mg/dL (ref 65–99)
Potassium: 4.6 mmol/L (ref 3.5–5.3)
Sodium: 139 mmol/L (ref 135–146)
Total Bilirubin: 0.5 mg/dL (ref 0.2–1.2)
Total Protein: 7 g/dL (ref 6.1–8.1)
eGFR: 103 mL/min/{1.73_m2} (ref >=60)

## 2021-09-22 LAB — HEPATITIS C ANTIBODY
Hepatitis C Ab: NONREACTIVE
SIGNAL TO CUT-OFF: 0.04 (ref <1.00)

## 2021-09-22 LAB — CBC WITH DIFFERENTIAL/PLATELET
Absolute Monocytes: 353 cells/uL (ref 200–950)
Basophils Absolute: 52 cells/uL (ref 0–200)
Basophils Relative: 1.2 %
Eosinophils Absolute: 90 cells/uL (ref 15–500)
Eosinophils Relative: 2.1 %
HCT: 42.5 % (ref 35.0–45.0)
Hemoglobin: 13.8 g/dL (ref 11.7–15.5)
Lymphs Abs: 1453 cells/uL (ref 850–3900)
MCH: 29.5 pg (ref 27.0–33.0)
MCHC: 32.5 g/dL (ref 32.0–36.0)
MCV: 90.8 fL (ref 80.0–100.0)
MPV: 9.8 fL (ref 7.5–12.5)
Monocytes Relative: 8.2 %
Neutro Abs: 2352 cells/uL (ref 1500–7800)
Neutrophils Relative %: 54.7 %
Platelets: 337 10*3/uL (ref 140–400)
RBC: 4.68 10*6/uL (ref 3.80–5.10)
RDW: 12.4 % (ref 11.0–15.0)
Total Lymphocyte: 33.8 %
WBC: 4.3 10*3/uL (ref 3.8–10.8)

## 2021-09-22 LAB — HIV ANTIBODY (ROUTINE TESTING W REFLEX): HIV 1&2 Ab, 4th Generation: NONREACTIVE

## 2021-09-22 LAB — VITAMIN D 25 HYDROXY (VIT D DEFICIENCY, FRACTURES): Vit D, 25-Hydroxy: 38 ng/mL (ref 30–100)

## 2021-09-22 LAB — LIPID PANEL
Cholesterol: 199 mg/dL (ref <200)
HDL: 58 mg/dL (ref >=50)
LDL Cholesterol (Calc): 125 mg/dL (calc) — ABNORMAL HIGH
Non-HDL Cholesterol (Calc): 141 mg/dL (calc) — ABNORMAL HIGH (ref <130)
Total CHOL/HDL Ratio: 3.4 (calc) (ref <5.0)
Triglycerides: 66 mg/dL (ref <150)

## 2021-09-22 LAB — TSH: TSH: 1.79 mIU/L

## 2021-09-22 NOTE — Progress Notes (Signed)
Overall labs look great! Cholesterol better than 3 years ago.

## 2021-10-07 ENCOUNTER — Encounter: Payer: Self-pay | Admitting: Family Medicine

## 2021-10-07 ENCOUNTER — Telehealth (INDEPENDENT_AMBULATORY_CARE_PROVIDER_SITE_OTHER): Payer: No Typology Code available for payment source | Admitting: Family Medicine

## 2021-10-07 VITALS — Ht 68.5 in | Wt 148.0 lb

## 2021-10-07 DIAGNOSIS — J0140 Acute pansinusitis, unspecified: Secondary | ICD-10-CM

## 2021-10-07 MED ORDER — AMOXICILLIN-POT CLAVULANATE 875-125 MG PO TABS: 1.0000 | ORAL_TABLET | Freq: Two times a day (BID) | ORAL | 0 refills | Status: AC

## 2021-10-07 MED ORDER — FLUTICASONE PROPIONATE 50 MCG/ACT NA SUSP: 2.0000 | Freq: Every day | NASAL | 0 refills | Status: DC

## 2021-10-07 MED ORDER — PREDNISONE 20 MG PO TABS: ORAL_TABLET | ORAL | 0 refills | Status: AC

## 2021-10-07 NOTE — Progress Notes (Signed)
Virtual Video Visit via MyChart Note  I connected with  Jacqueline Hughes on 10/07/21 at  8:50 AM EST by the video enabled telemedicine application for MyChart, and verified that I am speaking with the correct person using two identifiers.   I introduced myself as a Designer, jewellery with the practice. We discussed the limitations of evaluation and management by telemedicine and the availability of in person appointments. The patient expressed understanding and agreed to proceed.  Participating parties in this visit include: The patient and the nurse practitioner listed.  The patient is: At home I am: In the office - [place]  Subjective:    CC:  Chief Complaint  Patient presents with   URI    Since Sunday. Taking OTC Claritin and cold/flu medicine at night   Laryngitis    HPI: Jacqueline Hughes is a 55 y.o. year old female presenting today via [place] today for sinusitis, ear pain.  About 6 days ago, patient started to notice some upper body aches and fatigue. Symptoms have progressed and she reports feeling miserable now. She has had thick postnasal drainage, laryngitis, fatigue, cough, pansinusitis pressure, 8-9/10 left ear pain, chills/flushing (but no fevers), cervical lymphadenopathy, sore throat, headaches, dizziness. She denies any fevers, wheezing, nausea, vomiting, chest pain, dyspnea, loss of taste/smell. Negative COVID test at home.       Past medical history, Surgical history, Family history not pertinant except as noted below, Social history, Allergies, and medications have been entered into the medical record, reviewed, and corrections made.   Review of Systems:  All review of systems negative except what is listed in the HPI   Objective:    General:  Speaking clearly in complete sentences. Absent shortness of breath noted.   Alert and oriented x3.   Normal judgment.  Absent acute distress.   Impression and Recommendations:    1. Acute non-recurrent  pansinusitis - predniSONE (DELTASONE) 20 MG tablet; Take 2 tablets (40 mg total) by mouth daily with breakfast for 2 days, THEN 1 tablet (20 mg total) daily with breakfast for 2 days, THEN 0.5 tablets (10 mg total) daily with breakfast for 2 days.  Dispense: 7 tablet; Refill: 0 - amoxicillin-clavulanate (AUGMENTIN) 875-125 MG tablet; Take 1 tablet by mouth 2 (two) times daily for 7 days.  Dispense: 14 tablet; Refill: 0 - fluticasone (FLONASE) 50 MCG/ACT nasal spray; Place 2 sprays into both nostrils daily.  Dispense: 1 g; Refill: 0  Given severity of symptoms, will go ahead and treat with prednisone and antibiotics. Continue supportive measures including rest, hydration, humidifier use, steam showers, warm compresses to sinuses, warm liquids with lemon and honey, and over-the-counter cough, cold, and analgesics as needed. Patient aware of signs/symptoms requiring further/urgent evaluation.    Follow-up if symptoms worsen or fail to improve.    I discussed the assessment and treatment plan with the patient. The patient was provided an opportunity to ask questions and all were answered. The patient agreed with the plan and demonstrated an understanding of the instructions.   The patient was advised to call back or seek an in-person evaluation if the symptoms worsen or if the condition fails to improve as anticipated.  I spent 20 minutes dedicated to the care of this patient on the date of this encounter to include pre-visit chart review of prior notes and results, face-to-face time with the patient, and post-visit ordering of testing as indicated.   Terrilyn Saver, NP

## 2021-10-11 ENCOUNTER — Telehealth: Payer: No Typology Code available for payment source | Admitting: Physician Assistant

## 2021-10-11 DIAGNOSIS — R059 Cough, unspecified: Secondary | ICD-10-CM

## 2021-10-11 NOTE — Progress Notes (Signed)
Based on what you shared with me, I feel your condition warrants further evaluation and I recommend that you be seen in a face to face visit.  I am concerned that you need to be seen in person due to the severity and persistence of your symptoms despite appropriate treatment with antibiotics and other supportive medications. I think that you need to have a physical exam and may need a chest xray.   NOTE: There will be NO CHARGE for this eVisit   If you are having a true medical emergency please call 911.      For an urgent face to face visit, [facility] has six urgent care centers for your convenience:     [facility] [place] Get Driving Directions [phone] [place], [place] [address]    [facility]) Get Driving Directions [phone] [place], [place] [address]  [facility] ([place]) Get Driving Directions [phone] [address] [place],  [place]  [address]  [facility]  Get Driving Directions [phone] [employer] [place], [place], [place] [address]   [facility] [place] Get Driving Directions  [phone] [address] [place], [place] [address]   [facility] [place] Get Driving Directions [phone] [address], [place], [place] [address]  Your MyChart E-visit questionnaire answers were reviewed by a board certified advanced clinical practitioner to complete your personal care plan based on your specific symptoms.  Thank you for using e-Visits.    Approximately 5 minutes was spent documenting and reviewing patient's chart.

## 2021-10-12 ENCOUNTER — Encounter: Payer: Self-pay | Admitting: Family Medicine

## 2021-10-12 ENCOUNTER — Telehealth (INDEPENDENT_AMBULATORY_CARE_PROVIDER_SITE_OTHER): Payer: No Typology Code available for payment source | Admitting: Family Medicine

## 2021-10-12 VITALS — Ht 68.5 in | Wt 148.0 lb

## 2021-10-12 DIAGNOSIS — J0140 Acute pansinusitis, unspecified: Secondary | ICD-10-CM | POA: Diagnosis not present

## 2021-10-12 NOTE — Progress Notes (Signed)
Virtual Video Visit via MyChart Note  I connected with  [name] on 10/12/21 at 11:10 AM EST by the video enabled telemedicine application for MyChart, and verified that I am speaking with the correct person using two identifiers.   I introduced myself as a Designer, jewellery with the practice. We discussed the limitations of evaluation and management by telemedicine and the availability of in person appointments. The patient expressed understanding and agreed to proceed.  Participating parties in this visit include: The patient and the nurse practitioner listed.  The patient is: At home I am: In the office - [facility]  Subjective:    CC:  Chief Complaint  Patient presents with   flu like symptoms    X 12 days    HPI: Jacqueline Hughes is a 55 y.o. year old female presenting today via [place] today for sinus infection follow-up.  Patient states she was feeling about the same up until yesterday when she started taking Mucinex Sinus-Max. States that has helped significantly and she is noticing improvement. Cough has settled down and is not as frequent. All mucus is clear now. She has one day left of antibiotics. She denies any chest pain, shortness of breath, fevers. She would like to go back to work tomorrow.     Past medical history, Surgical history, Family history not pertinant except as noted below, Social history, Allergies, and medications have been entered into the medical record, reviewed, and corrections made.   Review of Systems:  All review of systems negative except what is listed in the HPI   Objective:    General:  Speaking clearly in complete sentences. Absent shortness of breath noted.   Alert and oriented x3.   Normal judgment.  Absent acute distress.   Impression and Recommendations:    1. Acute non-recurrent pansinusitis Patient is feeling much better today and would like to return to work tomorrow - note provided. Finish antibiotics as  prescribed. Continue supportive measures including rest, hydration, humidifier use, steam showers, warm compresses to sinuses, warm liquids with lemon and honey, and over-the-counter cough, cold, and analgesics as needed. Patient aware of signs/symptoms requiring further/urgent evaluation.     Follow-up if symptoms worsen or fail to improve.    I discussed the assessment and treatment plan with the patient. The patient was provided an opportunity to ask questions and all were answered. The patient agreed with the plan and demonstrated an understanding of the instructions.   The patient was advised to call back or seek an in-person evaluation if the symptoms worsen or if the condition fails to improve as anticipated.  I spent 20 minutes dedicated to the care of this patient on the date of this encounter to include pre-visit chart review of prior notes and results, face-to-face time with the patient, and post-visit ordering of testing as indicated.   Terrilyn Saver, NP

## 2021-10-18 ENCOUNTER — Other Ambulatory Visit (HOSPITAL_COMMUNITY): Payer: Self-pay

## 2021-10-18 ENCOUNTER — Ambulatory Visit (INDEPENDENT_AMBULATORY_CARE_PROVIDER_SITE_OTHER): Payer: No Typology Code available for payment source | Admitting: Family Medicine

## 2021-10-18 ENCOUNTER — Encounter: Payer: Self-pay | Admitting: Family Medicine

## 2021-10-18 ENCOUNTER — Other Ambulatory Visit: Payer: Self-pay

## 2021-10-18 VITALS — BP 130/79 | HR 135 | Temp 100.7°F | Ht 68.5 in | Wt 150.0 lb

## 2021-10-18 DIAGNOSIS — Z20822 Contact with and (suspected) exposure to covid-19: Secondary | ICD-10-CM

## 2021-10-18 DIAGNOSIS — R197 Diarrhea, unspecified: Secondary | ICD-10-CM | POA: Insufficient documentation

## 2021-10-18 DIAGNOSIS — E860 Dehydration: Secondary | ICD-10-CM

## 2021-10-18 DIAGNOSIS — R0989 Other specified symptoms and signs involving the circulatory and respiratory systems: Secondary | ICD-10-CM

## 2021-10-18 DIAGNOSIS — J0100 Acute maxillary sinusitis, unspecified: Secondary | ICD-10-CM | POA: Insufficient documentation

## 2021-10-18 LAB — POCT INFLUENZA A/B
Influenza A, POC: NEGATIVE
Influenza B, POC: NEGATIVE

## 2021-10-18 LAB — POC COVID19 BINAXNOW: SARS Coronavirus 2 Ag: NEGATIVE

## 2021-10-18 MED ORDER — DOXYCYCLINE HYCLATE 100 MG PO TABS
100.0000 mg | ORAL_TABLET | Freq: Two times a day (BID) | ORAL | 0 refills | Status: DC
  Filled 2021-10-18: qty 14, 7d supply, fill #0

## 2021-10-18 MED ORDER — ONDANSETRON HCL 4 MG PO TABS
4.0000 mg | ORAL_TABLET | Freq: Three times a day (TID) | ORAL | 0 refills | Status: DC | PRN
  Filled 2021-10-18: qty 20, 7d supply, fill #0

## 2021-10-18 MED ORDER — ONDANSETRON HCL 4 MG PO TABS: 4.0000 mg | ORAL_TABLET | Freq: Three times a day (TID) | ORAL | 0 refills | Status: DC | PRN

## 2021-10-18 MED ORDER — DOXYCYCLINE HYCLATE 100 MG PO TABS: 100.0000 mg | ORAL_TABLET | Freq: Two times a day (BID) | ORAL | 0 refills | Status: DC

## 2021-10-18 NOTE — Patient Instructions (Signed)
Take doxycycline for sinus infection.  Use zofran as needed for nausea.  Push fluids/rehydration solution such as pedialyte.  We'll be in touch with lab results.

## 2021-10-18 NOTE — Progress Notes (Signed)
Jacqueline Hughes - 55 y.o. female MRN [id number]  Date of birth: 10-Apr-1966  Subjective Chief Complaint  Patient presents with   Influenza    HPI Jacqueline Hughes is a 55 year old female here today with influenza-like illness.  Her symptoms started approximately 2 weeks ago with flulike illness including cough, sinus congestion and pain, fatigue, laryngitis, headaches and pharyngitis.  She had negative COVID test at home at that time.  She was treated for sinusitis with Augmentin however developed diarrhea with this.  She has had persistent diarrhea with nausea that has been worsening over the past few days.  This is not improved with discontinuation of antibiotic, Imodium, Pepto-Bismol or probiotics.  She has not noted blood in her stool.  She does feel dehydrated with dry mucous membranes and some dizziness.  She does continue to have sinus pain with thick, yellow mucus.  She also has fever.  She denies chest pain or shortness of breath.  Fluid COVID testing in clinic today are negative.  ROS:  A comprehensive ROS was completed and negative except as noted per HPI  Allergies  Allergen Reactions   E-Mycin [Erythromycin]    Lexapro [Escitalopram Oxalate]     Confused and disoriented.    Covid-19 Mrna Vaccine [employer]) [Covid-19 Mrna Vacc (Moderna)] Rash    Started on prednisone last night- vaccine on Saturday    Past Medical History:  Diagnosis Date   ADD (attention deficit disorder)    Anxiety    Depression    Hyperlipidemia    Osteopenia    Thyroid disease    Vitamin D deficiency     Past Surgical History:  Procedure Laterality Date   ABDOMINAL HYSTERECTOMY     AUGMENTATION MAMMAPLASTY     BREAST SURGERY     LAPAROTOMY      Social History   Socioeconomic History   Marital status: Married    Spouse name: Not on file   Number of children: Not on file   Years of education: Not on file   Highest education level: Not on file  Occupational History   Not on file  Tobacco Use   Smoking  status: Former    Types: Cigarettes    Quit date: 08/25/2004    Years since quitting: 17.1   Smokeless tobacco: Never  Vaping Use   Vaping Use: Never used  Substance and Sexual Activity   Alcohol use: Yes    Comment: social   Drug use: No   Sexual activity: Yes    Partners: Male  Other Topics Concern   Not on file  Social History Narrative   Not on file   Social Determinants of Health   Financial Resource Strain: Not on file  Food Insecurity: Not on file  Transportation Needs: Not on file  Physical Activity: Not on file  Stress: Not on file  Social Connections: Not on file    Family History  Problem Relation Age of Onset   Alcoholism Other        granparents   Depression Other        aunts    Health Maintenance  Topic Date Due   COLONOSCOPY (Pts 45-70yrs Insurance coverage will need to be confirmed)  Never done   MAMMOGRAM  03/07/2020   Zoster Vaccines- Shingrix (1 of 2) 12/22/2021 (Originally 05/08/2017)   INFLUENZA VACCINE  01/28/2022 (Originally 05/31/2021)   PAP SMEAR-Modifier  09/16/2036 (Originally 05/08/1988)   TETANUS/TDAP  08/30/2026   Hepatitis C Screening  Completed   HIV Screening  Completed   Pneumococcal Vaccine 93-49 Years old  Aged Out   HPV VACCINES  Aged Out   COVID-19 Vaccine  Discontinued     ----------------------------------------------------------------------------------------------------------------------------------------------------------------------------------------------------------------- Physical Exam BP 130/79 (BP Location: Left Arm, Patient Position: Sitting, Cuff Size: Normal)    Pulse (!) 135    Temp (!) 100.7 F (38.2 C) (Oral)    Ht 5' 8.5" (1.74 m)    Wt 150 lb (68 kg)    SpO2 97%    BMI 22.48 kg/m   Physical Exam Constitutional:      Appearance: Normal appearance.  HENT:     Head: Normocephalic and atraumatic.     Mouth/Throat:     Comments: Dry mucous membranes. Cardiovascular:     Rate and Rhythm: Normal rate and  regular rhythm.  Pulmonary:     Effort: Pulmonary effort is normal.     Breath sounds: Normal breath sounds.  Musculoskeletal:     Cervical back: Neck supple.  Skin:    General: Skin is warm and dry.  Neurological:     Mental Status: She is alert.  Psychiatric:        Mood and Affect: Mood normal.        Behavior: Behavior normal.    ------------------------------------------------------------------------------------------------------------------------------------------------------------------------------------------------------------------- Assessment and Plan  Dehydration Called to evaluate the patient for IV placement. Patient with nausea, vomiting, diarrhea. Poor oral intake, dehydrated. I did determine that the patient would need intravenous access, a 20-gauge angiocatheter placed in the left cubital vein, infusion started by primary treating provider and fluid management per primary treating provider.  Diarrhea She has had diarrhea for a couple weeks at this point.  Appears clinically dehydrated, given fluids in clinic today.  Encouraged to push fluids at home.  Prescription for Zofran given to help with nausea.  Checking stool culture and C. difficile.  CMP and CBC also ordered.  Acute maxillary sinusitis Starting doxycycline for management of sinusitis.  Meds ordered this encounter  Medications   DISCONTD: doxycycline (VIBRA-TABS) 100 MG tablet    Sig: Take 1 tablet (100 mg total) by mouth 2 (two) times daily for 7 days.    Dispense:  14 tablet    Refill:  0   DISCONTD: ondansetron (ZOFRAN) 4 MG tablet    Sig: Take 1 tablet (4 mg total) by mouth every 8 (eight) hours as needed for nausea or vomiting.    Dispense:  20 tablet    Refill:  0   doxycycline (VIBRA-TABS) 100 MG tablet    Sig: Take 1 tablet (100 mg total) by mouth 2 (two) times daily for 7 days.    Dispense:  14 tablet    Refill:  0   ondansetron (ZOFRAN) 4 MG tablet    Sig: Take 1 tablet (4 mg total) by  mouth every 8 (eight) hours as needed for nausea or vomiting.    Dispense:  20 tablet    Refill:  0    No follow-ups on file.    This visit occurred during the SARS-CoV-2 public health emergency.  Safety protocols were in place, including screening questions prior to the visit, additional usage of staff PPE, and extensive cleaning of exam room while observing appropriate contact time as indicated for disinfecting solutions.

## 2021-10-18 NOTE — Progress Notes (Signed)
° ° °  Procedures performed today:    20-gauge angiocatheter placed in the left cubital vein, flush successful, infusion initiated and successful.  Independent interpretation of notes and tests performed by another provider:   None.  Brief History, Exam, Impression, and Recommendations:    Dehydration Called to evaluate the patient for IV placement. Patient with nausea, vomiting, diarrhea. Poor oral intake, dehydrated. I did determine that the patient would need intravenous access, a 20-gauge angiocatheter placed in the left cubital vein, infusion started by primary treating provider and fluid management per primary treating provider.    ___________________________________________ Gwen Her. Dianah Field, M.D., ABFM., CAQSM. Primary Care and [employer] Instructor of [place] [facility]

## 2021-10-18 NOTE — Assessment & Plan Note (Signed)
She has had diarrhea for a couple weeks at this point.  Appears clinically dehydrated, given fluids in clinic today.  Encouraged to push fluids at home.  Prescription for Zofran given to help with nausea.  Checking stool culture and C. difficile.  CMP and CBC also ordered.

## 2021-10-18 NOTE — Assessment & Plan Note (Signed)
Starting doxycycline for management of sinusitis.

## 2021-10-18 NOTE — Assessment & Plan Note (Signed)
Called to evaluate the patient for IV placement. Patient with nausea, vomiting, diarrhea. Poor oral intake, dehydrated. I did determine that the patient would need intravenous access, a 20-gauge angiocatheter placed in the left cubital vein, infusion started by primary treating provider and fluid management per primary treating provider.

## 2021-10-19 ENCOUNTER — Telehealth: Payer: Self-pay

## 2021-10-19 ENCOUNTER — Inpatient Hospital Stay (HOSPITAL_BASED_OUTPATIENT_CLINIC_OR_DEPARTMENT_OTHER)
Admission: EM | Admit: 2021-10-19 | Discharge: 2021-10-27 | DRG: 872 | Disposition: A | Discharge status: Home / Self Care | Payer: No Typology Code available for payment source | Attending: Internal Medicine | Admitting: Internal Medicine

## 2021-10-19 ENCOUNTER — Other Ambulatory Visit: Payer: Self-pay

## 2021-10-19 ENCOUNTER — Emergency Department (HOSPITAL_BASED_OUTPATIENT_CLINIC_OR_DEPARTMENT_OTHER): Payer: No Typology Code available for payment source

## 2021-10-19 ENCOUNTER — Encounter (HOSPITAL_BASED_OUTPATIENT_CLINIC_OR_DEPARTMENT_OTHER): Payer: Self-pay | Admitting: *Deleted

## 2021-10-19 DIAGNOSIS — E861 Hypovolemia: Secondary | ICD-10-CM | POA: Diagnosis present

## 2021-10-19 DIAGNOSIS — D271 Benign neoplasm of left ovary: Secondary | ICD-10-CM | POA: Diagnosis present

## 2021-10-19 DIAGNOSIS — K5100 Ulcerative (chronic) pancolitis without complications: Secondary | ICD-10-CM | POA: Diagnosis present

## 2021-10-19 DIAGNOSIS — D72829 Elevated white blood cell count, unspecified: Secondary | ICD-10-CM | POA: Diagnosis present

## 2021-10-19 DIAGNOSIS — Z79899 Other long term (current) drug therapy: Secondary | ICD-10-CM

## 2021-10-19 DIAGNOSIS — Z20822 Contact with and (suspected) exposure to covid-19: Secondary | ICD-10-CM | POA: Diagnosis present

## 2021-10-19 DIAGNOSIS — R109 Unspecified abdominal pain: Secondary | ICD-10-CM

## 2021-10-19 DIAGNOSIS — D282 Benign neoplasm of uterine tubes and ligaments: Secondary | ICD-10-CM | POA: Diagnosis present

## 2021-10-19 DIAGNOSIS — M8580 Other specified disorders of bone density and structure, unspecified site: Secondary | ICD-10-CM | POA: Diagnosis present

## 2021-10-19 DIAGNOSIS — A414 Sepsis due to anaerobes: Secondary | ICD-10-CM | POA: Diagnosis not present

## 2021-10-19 DIAGNOSIS — E871 Hypo-osmolality and hyponatremia: Secondary | ICD-10-CM | POA: Diagnosis present

## 2021-10-19 DIAGNOSIS — Z888 Allergy status to other drugs, medicaments and biological substances status: Secondary | ICD-10-CM

## 2021-10-19 DIAGNOSIS — R197 Diarrhea, unspecified: Secondary | ICD-10-CM | POA: Diagnosis present

## 2021-10-19 DIAGNOSIS — A419 Sepsis, unspecified organism: Secondary | ICD-10-CM | POA: Diagnosis present

## 2021-10-19 DIAGNOSIS — F419 Anxiety disorder, unspecified: Secondary | ICD-10-CM | POA: Diagnosis present

## 2021-10-19 DIAGNOSIS — N898 Other specified noninflammatory disorders of vagina: Secondary | ICD-10-CM

## 2021-10-19 DIAGNOSIS — L308 Other specified dermatitis: Secondary | ICD-10-CM | POA: Diagnosis present

## 2021-10-19 DIAGNOSIS — Z8619 Personal history of other infectious and parasitic diseases: Secondary | ICD-10-CM

## 2021-10-19 DIAGNOSIS — E039 Hypothyroidism, unspecified: Secondary | ICD-10-CM | POA: Diagnosis present

## 2021-10-19 DIAGNOSIS — F32A Depression, unspecified: Secondary | ICD-10-CM | POA: Diagnosis present

## 2021-10-19 DIAGNOSIS — F909 Attention-deficit hyperactivity disorder, unspecified type: Secondary | ICD-10-CM | POA: Diagnosis present

## 2021-10-19 DIAGNOSIS — Z87891 Personal history of nicotine dependence: Secondary | ICD-10-CM

## 2021-10-19 DIAGNOSIS — E876 Hypokalemia: Secondary | ICD-10-CM | POA: Diagnosis present

## 2021-10-19 DIAGNOSIS — A0472 Enterocolitis due to Clostridium difficile, not specified as recurrent: Secondary | ICD-10-CM | POA: Diagnosis present

## 2021-10-19 DIAGNOSIS — E785 Hyperlipidemia, unspecified: Secondary | ICD-10-CM | POA: Diagnosis present

## 2021-10-19 DIAGNOSIS — E079 Disorder of thyroid, unspecified: Secondary | ICD-10-CM | POA: Diagnosis present

## 2021-10-19 DIAGNOSIS — B3731 Acute candidiasis of vulva and vagina: Secondary | ICD-10-CM | POA: Diagnosis present

## 2021-10-19 DIAGNOSIS — D649 Anemia, unspecified: Secondary | ICD-10-CM | POA: Diagnosis present

## 2021-10-19 HISTORY — DX: Personal history of other infectious and parasitic diseases: Z86.19

## 2021-10-19 LAB — COMPREHENSIVE METABOLIC PANEL
ALT: 14 U/L (ref 0–44)
AST: 19 U/L (ref 15–41)
Albumin: 3.4 g/dL — ABNORMAL LOW (ref 3.5–5.0)
Alkaline Phosphatase: 44 U/L (ref 38–126)
Anion gap: 7 (ref 5–15)
BUN: 12 mg/dL (ref 6–20)
CO2: 27 mmol/L (ref 22–32)
Calcium: 8.2 mg/dL — ABNORMAL LOW (ref 8.9–10.3)
Chloride: 95 mmol/L — ABNORMAL LOW (ref 98–111)
Creatinine, Ser: 0.75 mg/dL (ref 0.44–1.00)
GFR, Estimated: >60 mL/min (ref >60)
Glucose, Bld: 127 mg/dL — ABNORMAL HIGH (ref 70–99)
Potassium: 3.5 mmol/L (ref 3.5–5.1)
Sodium: 129 mmol/L — ABNORMAL LOW (ref 135–145)
Total Bilirubin: 0.6 mg/dL (ref 0.3–1.2)
Total Protein: 6.7 g/dL (ref 6.5–8.1)

## 2021-10-19 LAB — CBC WITH DIFFERENTIAL/PLATELET
Abs Immature Granulocytes: 0.05 10*3/uL (ref 0.00–0.07)
Absolute Monocytes: 488 cells/uL (ref 200–950)
Basophils Absolute: 37 cells/uL (ref 0–200)
Basophils Absolute: 0.1 10*3/uL (ref 0.0–0.1)
Basophils Relative: 0.3 %
Basophils Relative: 0 %
Eosinophils Absolute: 49 cells/uL (ref 15–500)
Eosinophils Absolute: 0 10*3/uL (ref 0.0–0.5)
Eosinophils Relative: 0.4 %
Eosinophils Relative: 0 %
HCT: 39.8 % (ref 35.0–45.0)
HCT: 39.2 % (ref 36.0–46.0)
Hemoglobin: 13.5 g/dL (ref 11.7–15.5)
Hemoglobin: 13 g/dL (ref 12.0–15.0)
Immature Granulocytes: 0 %
Lymphocytes Relative: 8 %
Lymphs Abs: 744 cells/uL — ABNORMAL LOW (ref 850–3900)
Lymphs Abs: 1 10*3/uL (ref 0.7–4.0)
MCH: 30.4 pg (ref 27.0–33.0)
MCH: 30 pg (ref 26.0–34.0)
MCHC: 33.9 g/dL (ref 32.0–36.0)
MCHC: 33.2 g/dL (ref 30.0–36.0)
MCV: 89.6 fL (ref 80.0–100.0)
MCV: 90.5 fL (ref 80.0–100.0)
MPV: 9.4 fL (ref 7.5–12.5)
Monocytes Absolute: 0.7 10*3/uL (ref 0.1–1.0)
Monocytes Relative: 4 %
Monocytes Relative: 5 %
Neutro Abs: 10882 cells/uL — ABNORMAL HIGH (ref 1500–7800)
Neutro Abs: 10.6 10*3/uL — ABNORMAL HIGH (ref 1.7–7.7)
Neutrophils Relative %: 89.2 %
Neutrophils Relative %: 87 %
Platelets: 310 10*3/uL (ref 140–400)
Platelets: 247 10*3/uL (ref 150–400)
RBC: 4.44 10*6/uL (ref 3.80–5.10)
RBC: 4.33 MIL/uL (ref 3.87–5.11)
RDW: 12.9 % (ref 11.0–15.0)
RDW: 13.9 % (ref 11.5–15.5)
Total Lymphocyte: 6.1 %
WBC: 12.2 10*3/uL — ABNORMAL HIGH (ref 3.8–10.8)
WBC: 12.4 10*3/uL — ABNORMAL HIGH (ref 4.0–10.5)
nRBC: 0 % (ref 0.0–0.2)

## 2021-10-19 LAB — COMPLETE METABOLIC PANEL WITH GFR
AG Ratio: 1.6 (calc) (ref 1.0–2.5)
ALT: 12 U/L (ref 6–29)
AST: 18 U/L (ref 10–35)
Albumin: 4.4 g/dL (ref 3.6–5.1)
Alkaline phosphatase (APISO): 54 U/L (ref 37–153)
BUN: 9 mg/dL (ref 7–25)
CO2: 26 mmol/L (ref 20–32)
Calcium: 8.7 mg/dL (ref 8.6–10.4)
Chloride: 99 mmol/L (ref 98–110)
Creat: 0.74 mg/dL (ref 0.50–1.03)
Globulin: 2.7 g/dL (calc) (ref 1.9–3.7)
Glucose, Bld: 85 mg/dL (ref 65–99)
Potassium: 4.1 mmol/L (ref 3.5–5.3)
Sodium: 136 mmol/L (ref 135–146)
Total Bilirubin: 0.5 mg/dL (ref 0.2–1.2)
Total Protein: 7.1 g/dL (ref 6.1–8.1)
eGFR: 96 mL/min/{1.73_m2} (ref >=60)

## 2021-10-19 LAB — LIPASE, BLOOD: Lipase: 22 U/L (ref 11–51)

## 2021-10-19 LAB — MAGNESIUM: Magnesium: 1.7 mg/dL (ref 1.7–2.4)

## 2021-10-19 MED ORDER — LACTATED RINGERS IV BOLUS
1000.0000 mL | Freq: Once | INTRAVENOUS | Status: AC
  Administered 2021-10-19: 23:00:00 1000 mL via INTRAVENOUS

## 2021-10-19 MED ORDER — SODIUM CHLORIDE 0.9 % IV BOLUS
1000.0000 mL | Freq: Once | INTRAVENOUS | Status: AC
  Administered 2021-10-19: 21:00:00 1000 mL via INTRAVENOUS

## 2021-10-19 MED ORDER — DICYCLOMINE HCL 10 MG/ML IM SOLN
20.0000 mg | Freq: Once | INTRAMUSCULAR | Status: AC
  Administered 2021-10-19: 23:00:00 20 mg via INTRAMUSCULAR
  Filled 2021-10-19: qty 2

## 2021-10-19 MED ORDER — IOHEXOL 300 MG/ML  SOLN
100.0000 mL | Freq: Once | INTRAMUSCULAR | Status: AC | PRN
  Administered 2021-10-19: 23:00:00 100 mL via INTRAVENOUS

## 2021-10-19 MED ORDER — ONDANSETRON HCL 4 MG/2ML IJ SOLN
4.0000 mg | Freq: Once | INTRAMUSCULAR | Status: AC
  Administered 2021-10-19: 23:00:00 4 mg via INTRAVENOUS
  Filled 2021-10-19: qty 2

## 2021-10-19 NOTE — Telephone Encounter (Signed)
Returned pt's call. She states that she is unable to leave the house due to continued watery diarrhea. Unable to stay hydrated due to nausea.   Per Dr. West Pugh, Jacqueline Hughes has been directed to the Emergency Room for additional fluids.

## 2021-10-19 NOTE — ED Notes (Signed)
Patient transported to CT 

## 2021-10-19 NOTE — Telephone Encounter (Signed)
Pt lvm stating "last night was the absolute worst night" she's had. Stool (no odor) = sludge. Runny diarrhea not liquid. Emesis. Stool sample has been collected by her husband.   Please advise.

## 2021-10-19 NOTE — Telephone Encounter (Signed)
Continue to push fluids.  Will await results of stool study.

## 2021-10-19 NOTE — Plan of Care (Signed)
Hospitalist transfer note:  55 yo F with h/o Diarrhea for several weeks.  EDP with strong suspicion of C.Diff.  C.Diff is ordered and pending.  Pancolitis on CT, 20+ BMs today.  On augmentin x1 month ago for sinusitis.  Tm 102.4 yesterday.  Getting IVF.  WBC 12.4k.  Green, mucous heavy diarrhea.  Sending to med-surg at [facility].  [facility] will assume care on arrival to accepting facility. Until arrival, care as per EDP. However, [facility] available 24/7 for questions and assistance.  Nursing staff, please page [place] and Consults [phone]) as soon as the patient arrives the hospital.

## 2021-10-19 NOTE — ED Triage Notes (Addendum)
C/o severe diarrhea x " weeks" , n/v x 2 days, seen by PMD yesterday for same. Cont today w/o improvement

## 2021-10-19 NOTE — ED Provider Notes (Signed)
[facility] EMERGENCY DEPARTMENT Provider Note   CSN: [id number] Arrival date & time: 10/19/21  1828     History Chief Complaint  Patient presents with   Diarrhea    Jacqueline Hughes is a 55 y.o. female.  Jacqueline Hughes is a 54 y.o. female with history of hyperlipidemia, osteopenia, thyroid disease, anxiety, depression, who presents to the emergency department for evaluation of diarrhea.  Patient reports she has been having diarrhea for about a month.  She reports 1 month ago she was seen by her PCP via video visit for treatment of 6 days of of sinusitis and laryngitis.  Patient reports she was started on Augmentin.  At this time she had been having some mild diarrhea but after taking the Augmentin for 4 days she reports her diarrhea changed.  She reports stools became loose, mucousy and green with no odor but she reports that the stool seemed heavy and took several flushes to get to go down.  She reports since then she has been having several bowel movements throughout the day.  Even after completing the Augmentin diarrhea has continued to worsen.  She reports with that she has had some generalized abdominal cramping.  She reports that Sunday evening she was having several watery stools and then started to have some palpitations, felt lightheaded and was having some muscle cramping.  The next day she saw her PCP in the office.  At this time patient reports she had a fever of 102 at home, was tachycardic in the office, she reports that they sent lab work and had plan to collect stool studies, gave her a liter of IV fluids.  She reports that they also felt that she had some residual sinusitis so put her on doxycycline for additional antibiotic treatment. Patient reports that she has tried Imodium, Pepto-Bismol and probiotics and nothing has seemed to improve her diarrhea.  She reports no blood in her stool.  Yesterday she had some nausea with one episode of vomiting.   The history is provided  by the patient, medical records and the spouse.    Past Medical History:  Diagnosis Date   ADD (attention deficit disorder)    Anxiety    Depression    Hyperlipidemia    Osteopenia    Thyroid disease    Vitamin D deficiency     Patient Active Problem List   Diagnosis Date Noted   Pancolitis ([place]) 10/20/2021   Dehydration 10/18/2021   Diarrhea 10/18/2021   Acute maxillary sinusitis 10/18/2021   Moderate episode of recurrent major depressive disorder ([place]) 03/10/2021   Closed fracture of sesamoid bone of left foot 78/29/5621   Periumbilical pain 30/86/5784   S/P bilateral breast implants 01/17/2020   Itching 01/17/2020   Rash 01/17/2020   Flushing 01/17/2020   Immunization reaction 01/01/2020   Muscle spasm 03/18/2019   Acute stress reaction 03/18/2019   Stress at work 08/07/2018   Depression, major, single episode, moderate ([place]) 08/02/2018   Grief reaction with prolonged bereavement 08/02/2018   Elevated LDL cholesterol level 03/07/2018   ADD (attention deficit disorder) 03/05/2018   Memory changes 03/05/2018   Word finding difficulty 03/05/2018   Varicose vein of leg 06/17/2017   Primary insomnia 06/17/2017   Cervical herniated disc 06/17/2017   Recurrent cold sores 01/20/2016   Osteopenia 08/16/2015   Food intolerance 08/29/2014   Adult ADHD 08/25/2014   Depression 08/25/2014   Vitamin D deficiency 08/25/2014   Dyslexia 08/25/2014    Past Surgical  History:  Procedure Laterality Date   ABDOMINAL HYSTERECTOMY     AUGMENTATION MAMMAPLASTY     BREAST SURGERY     LAPAROTOMY       OB History   No obstetric history on file.     Family History  Problem Relation Age of Onset   Alcoholism Other        granparents   Depression Other        aunts    Social History   Tobacco Use   Smoking status: Former    Types: Cigarettes    Quit date: 08/25/2004    Years since quitting: 17.1   Smokeless tobacco: Never  Vaping Use   Vaping Use: Never used   Substance Use Topics   Alcohol use: Yes    Comment: social   Drug use: No    Home Medications Prior to Admission medications   Medication Sig Start Date End Date Taking? Authorizing Provider  ALPRAZolam Duanne Moron) 0.5 MG tablet Take 1 tablet (0.5 mg total) by mouth at bedtime as needed for anxiety. 03/18/19   Breeback, Jade L, PA-C  amphetamine-dextroamphetamine (ADDERALL XR) 15 MG 24 hr capsule Take 1 capsule by mouth every morning. 10/21/21   Donella Stade, PA-C  amphetamine-dextroamphetamine (ADDERALL XR) 15 MG 24 hr capsule Take 1 capsule by mouth every morning. 11/20/21   Breeback, Jade L, PA-C  amphetamine-dextroamphetamine (ADDERALL XR) 15 MG 24 hr capsule Take 1 capsule by mouth every morning. 09/21/21   Breeback, Royetta Car, PA-C  doxycycline (VIBRA-TABS) 100 MG tablet Take 1 tablet (100 mg total) by mouth 2 (two) times daily for 7 days. 10/18/21 10/25/21  Luetta Nutting, DO  FLUoxetine (PROZAC) 20 MG capsule Take 1 capsule (20 mg total) by mouth daily. 09/21/21   Breeback, Jade L, PA-C  fluticasone (FLONASE) 50 MCG/ACT nasal spray Place 2 sprays into both nostrils daily. 10/07/21 10/07/22  Terrilyn Saver, NP  ondansetron (ZOFRAN) 4 MG tablet Take 1 tablet (4 mg total) by mouth every 8 (eight) hours as needed for nausea or vomiting. 10/18/21   Luetta Nutting, DO  Vitamin D, Ergocalciferol, (DRISDOL) 1.25 MG (50000 UNIT) CAPS capsule Take 1 capsule (50,000 Units total) by mouth every 7 (seven) days. 09/21/21   Donella Stade, PA-C    Allergies    E-mycin [erythromycin], Lexapro [escitalopram oxalate], and Covid-19 mrna vaccine (pfizer) [covid-19 mrna vacc (moderna)]  Review of Systems   Review of Systems  Constitutional:  Positive for chills and fever.  HENT:  Positive for congestion, rhinorrhea and sinus pressure. Negative for sore throat.   Respiratory:  Negative for cough and shortness of breath.   Cardiovascular:  Negative for chest pain.  Gastrointestinal:  Positive for  abdominal pain, diarrhea, nausea and vomiting.  Genitourinary:  Negative for dysuria and frequency.  Musculoskeletal:  Positive for myalgias.  Skin:  Negative for color change and rash.  Neurological:  Positive for light-headedness. Negative for dizziness, syncope and weakness.  All other systems reviewed and are negative.  Physical Exam Updated Vital Signs BP 99/67    Pulse 100    Temp 99.6 F (37.6 C) (Oral)    Resp 15    Ht 5\' 8"  (1.727 m)    Wt 67.1 kg    SpO2 95%    BMI 22.50 kg/m   Physical Exam Vitals and nursing note reviewed.  Constitutional:      General: She is not in acute distress.    Appearance: Normal appearance. She is well-developed. She  is ill-appearing. She is not diaphoretic.  HENT:     Head: Normocephalic and atraumatic.     Mouth/Throat:     Mouth: Mucous membranes are dry.  Eyes:     General:        Right eye: No discharge.        Left eye: No discharge.     Pupils: Pupils are equal, round, and reactive to light.  Cardiovascular:     Rate and Rhythm: Regular rhythm. Tachycardia present.     Pulses: Normal pulses.     Heart sounds: Normal heart sounds.  Pulmonary:     Effort: Pulmonary effort is normal. No respiratory distress.     Breath sounds: Normal breath sounds. No wheezing or rales.     Comments: Respirations equal and unlabored, patient able to speak in full sentences, lungs clear to auscultation bilaterally  Abdominal:     General: Bowel sounds are normal. There is no distension.     Palpations: Abdomen is soft. There is no mass.     Tenderness: There is abdominal tenderness. There is no guarding.     Comments: Abdomen soft, bowel sounds hyperactive, mild generalized tenderness noted throughout the abdomen that does not localize to any 1 quadrant, no peritoneal signs  Musculoskeletal:        General: No deformity.     Cervical back: Neck supple.  Skin:    General: Skin is warm and dry.     Capillary Refill: Capillary refill takes less than 2  seconds.  Neurological:     Mental Status: She is alert and oriented to person, place, and time.     Coordination: Coordination normal.     Comments: Speech is clear, able to follow commands Moves extremities without ataxia, coordination intact  Psychiatric:        Mood and Affect: Mood normal.        Behavior: Behavior normal.    ED Results / Procedures / Treatments   Labs (all labs ordered are listed, but only abnormal results are displayed) Labs Reviewed  C DIFFICILE QUICK SCREEN W PCR REFLEX   - Abnormal; Notable for the following components:      Result Value   C Diff antigen POSITIVE (*)    C Diff toxin POSITIVE (*)    All other components within normal limits  CBC WITH DIFFERENTIAL/PLATELET - Abnormal; Notable for the following components:   WBC 12.4 (*)    Neutro Abs 10.6 (*)    All other components within normal limits  COMPREHENSIVE METABOLIC PANEL - Abnormal; Notable for the following components:   Sodium 129 (*)    Chloride 95 (*)    Glucose, Bld 127 (*)    Calcium 8.2 (*)    Albumin 3.4 (*)    All other components within normal limits  GASTROINTESTINAL PANEL BY PCR, STOOL (REPLACES STOOL CULTURE)  CULTURE, BLOOD (ROUTINE X 2)  CULTURE, BLOOD (ROUTINE X 2)  RESP PANEL BY RT-PCR (FLU A&B, COVID) ARPGX2  LIPASE, BLOOD  MAGNESIUM    EKG None  Radiology CT ABDOMEN PELVIS W CONTRAST  Result Date: 10/19/2021 CLINICAL DATA:  Diarrhea Abdominal pain, acute, nonlocalized EXAM: CT ABDOMEN AND PELVIS WITH CONTRAST TECHNIQUE: Multidetector CT imaging of the abdomen and pelvis was performed using the standard protocol following bolus administration of intravenous contrast. CONTRAST:  1106mL OMNIPAQUE IOHEXOL 300 MG/ML  SOLN COMPARISON:  None. FINDINGS: Lower chest: No acute abnormality. Bilateral breast implants are noted partially. Hepatobiliary: No focal liver abnormality.  No gallstones, gallbladder wall thickening, or pericholecystic fluid. No biliary dilatation.  Pancreas: No focal lesion. Normal pancreatic contour. No surrounding inflammatory changes. No main pancreatic ductal dilatation. Spleen: Normal in size without focal abnormality. Adrenals/Urinary Tract: No adrenal nodule bilaterally. Bilateral kidneys enhance symmetrically. No hydronephrosis. No hydroureter. The urinary bladder is unremarkable. On delayed imaging, there is no urothelial wall thickening and there are no filling defects in the opacified portions of the bilateral collecting systems or ureters. Stomach/Bowel: Stomach is within normal limits. No evidence of small bowel wall thickening or dilatation. The colon demonstrates bowel wall thickening, mucosal hyperemia, pericolonic fat stranding. Appendix appears normal. Vascular/Lymphatic: No abdominal aorta or iliac aneurysm. No abdominal, pelvic, or inguinal lymphadenopathy. Reproductive: Complex appearing 7.9 x 5.8 cm left adnexal lesion. The right adnexa/ovary is grossly unremarkable. Status post hysterectomy. Other: Trace simple free fluid ascites. No intraperitoneal free gas. No organized fluid collection. Musculoskeletal: No abdominal wall hernia or abnormality. No suspicious lytic or blastic osseous lesions. No acute displaced fracture. Multilevel degenerative changes of the spine. IMPRESSION: 1. Pancolitis. Differential diagnosis for etiology includes infection or inflammation. No associated bowel perforation or obstruction. 2. Complex appearing 7.9 x 5.8 cm left adnexal lesion. Recommend pelvic ultrasound and gynecologic consultation. 3. Status post hysterectomy. 4. Trace simple free fluid ascites. Electronically Signed   By: Iven Finn M.D.   On: 10/19/2021 23:06    Procedures .Critical Care Performed by: Jacqlyn Larsen, PA-C Authorized by: Jacqlyn Larsen, PA-C   Critical care provider statement:    Critical care time (minutes):  30   Critical care was necessary to treat or prevent imminent or life-threatening deterioration of the  following conditions: C diff, meets SIRS criteria.   Critical care was time spent personally by me on the following activities:  Development of treatment plan with patient or surrogate, discussions with consultants, evaluation of patient's response to treatment, examination of patient, ordering and review of laboratory studies, ordering and review of radiographic studies, ordering and performing treatments and interventions, pulse oximetry, re-evaluation of patient's condition and review of old charts   Care discussed with: admitting provider     Medications Ordered in ED Medications  lactated ringers infusion ( Intravenous New Bag/Given 10/20/21 0019)  vancomycin (VANCOCIN) capsule 125 mg (has no administration in time range)  sodium chloride 0.9 % bolus 1,000 mL (0 mLs Intravenous Stopped 10/19/21 2240)  lactated ringers bolus 1,000 mL (0 mLs Intravenous Stopped 10/20/21 0019)  dicyclomine (BENTYL) injection 20 mg (20 mg Intramuscular Given 10/19/21 2238)  ondansetron (ZOFRAN) injection 4 mg (4 mg Intravenous Given 10/19/21 2235)  iohexol (OMNIPAQUE) 300 MG/ML solution 100 mL (100 mLs Intravenous Contrast Given 10/19/21 2243)    ED Course  I have reviewed the triage vital signs and the nursing notes.  Pertinent labs & imaging results that were available during my care of the patient were reviewed by me and considered in my medical decision making (see chart for details).    MDM Rules/Calculators/A&P                          55 y.o. female presents to the ED with complaints of diarrhea, this involves an extensive number of treatment options, and is a complaint that carries with it a high risk of complications and morbidity.  The differential diagnosis includes C. difficile, colitis, IBS, infectious diarrhea, viral gastroenteritis  On arrival pt is ill-appearing, patient is tachycardic with a low-grade temp of 99.6. Exam  significant for hyperactive bowel sounds and generalized abdominal  tenderness, patient appears dehydrated  Additional history obtained from spouse and medical records. Previous records obtained and reviewed via EMR  I ordered IV fluids, Bentyl and Zofran  Lab Tests:  I Ordered, reviewed, and interpreted labs, which included:  CBC: Leukocytosis of 12.4, normal hemoglobin CMP: Sodium of 129, chloride of 95, normal potassium fortunately, normal renal and liver function Magnesium: WNL C. difficile: Positive for antigen and toxin   Imaging Studies ordered:  I ordered imaging studies which included CT abdomen pelvis, I independently visualized and interpreted imaging which showed pancolitis  ED Course:   Given the patient is having 20+ bowel movements daily, is tachycardic, appears very dehydrated and has been having fevers up to 102 at home I feel she would benefit from admission for continued treatment for diarrhea with IV fluid rehydration.  Patient has also tested positive for C. difficile with pancolitis on CT.  Will start on oral vancomycin for treatment of C. difficile.  Case discussed with Dr. Princella Pellegrini with [facility] hospitalist who accepts patient for transfer and admission   Portions of this note were generated with Dragon dictation software. Dictation errors may occur despite best attempts at proofreading.    Final Clinical Impression(s) / ED Diagnoses Final diagnoses:  C. difficile colitis    Rx / DC Orders ED Discharge Orders     None        Jacqlyn Larsen, [place] 10/20/21 [place], DO 10/20/21 2148

## 2021-10-20 ENCOUNTER — Encounter (HOSPITAL_COMMUNITY): Payer: Self-pay | Admitting: Internal Medicine

## 2021-10-20 ENCOUNTER — Other Ambulatory Visit (HOSPITAL_COMMUNITY): Payer: Self-pay

## 2021-10-20 DIAGNOSIS — D649 Anemia, unspecified: Secondary | ICD-10-CM | POA: Diagnosis present

## 2021-10-20 DIAGNOSIS — E785 Hyperlipidemia, unspecified: Secondary | ICD-10-CM | POA: Diagnosis present

## 2021-10-20 DIAGNOSIS — Z20822 Contact with and (suspected) exposure to covid-19: Secondary | ICD-10-CM | POA: Diagnosis present

## 2021-10-20 DIAGNOSIS — D72829 Elevated white blood cell count, unspecified: Secondary | ICD-10-CM | POA: Diagnosis not present

## 2021-10-20 DIAGNOSIS — E079 Disorder of thyroid, unspecified: Secondary | ICD-10-CM | POA: Diagnosis not present

## 2021-10-20 DIAGNOSIS — Z87891 Personal history of nicotine dependence: Secondary | ICD-10-CM | POA: Diagnosis not present

## 2021-10-20 DIAGNOSIS — D271 Benign neoplasm of left ovary: Secondary | ICD-10-CM | POA: Diagnosis present

## 2021-10-20 DIAGNOSIS — K5100 Ulcerative (chronic) pancolitis without complications: Secondary | ICD-10-CM | POA: Diagnosis present

## 2021-10-20 DIAGNOSIS — M8580 Other specified disorders of bone density and structure, unspecified site: Secondary | ICD-10-CM | POA: Diagnosis present

## 2021-10-20 DIAGNOSIS — A419 Sepsis, unspecified organism: Secondary | ICD-10-CM | POA: Diagnosis present

## 2021-10-20 DIAGNOSIS — A0472 Enterocolitis due to Clostridium difficile, not specified as recurrent: Secondary | ICD-10-CM | POA: Diagnosis present

## 2021-10-20 DIAGNOSIS — A09 Infectious gastroenteritis and colitis, unspecified: Secondary | ICD-10-CM

## 2021-10-20 DIAGNOSIS — B3731 Acute candidiasis of vulva and vagina: Secondary | ICD-10-CM | POA: Diagnosis present

## 2021-10-20 DIAGNOSIS — F32A Depression, unspecified: Secondary | ICD-10-CM | POA: Diagnosis present

## 2021-10-20 DIAGNOSIS — F909 Attention-deficit hyperactivity disorder, unspecified type: Secondary | ICD-10-CM | POA: Diagnosis present

## 2021-10-20 DIAGNOSIS — Z79899 Other long term (current) drug therapy: Secondary | ICD-10-CM | POA: Diagnosis not present

## 2021-10-20 DIAGNOSIS — L308 Other specified dermatitis: Secondary | ICD-10-CM | POA: Diagnosis present

## 2021-10-20 DIAGNOSIS — D282 Benign neoplasm of uterine tubes and ligaments: Secondary | ICD-10-CM | POA: Diagnosis present

## 2021-10-20 DIAGNOSIS — E871 Hypo-osmolality and hyponatremia: Secondary | ICD-10-CM | POA: Diagnosis present

## 2021-10-20 DIAGNOSIS — F419 Anxiety disorder, unspecified: Secondary | ICD-10-CM | POA: Diagnosis present

## 2021-10-20 DIAGNOSIS — Z888 Allergy status to other drugs, medicaments and biological substances status: Secondary | ICD-10-CM | POA: Diagnosis not present

## 2021-10-20 DIAGNOSIS — E039 Hypothyroidism, unspecified: Secondary | ICD-10-CM | POA: Diagnosis present

## 2021-10-20 DIAGNOSIS — E876 Hypokalemia: Secondary | ICD-10-CM | POA: Diagnosis present

## 2021-10-20 DIAGNOSIS — R109 Unspecified abdominal pain: Secondary | ICD-10-CM | POA: Diagnosis not present

## 2021-10-20 DIAGNOSIS — A414 Sepsis due to anaerobes: Secondary | ICD-10-CM | POA: Diagnosis present

## 2021-10-20 DIAGNOSIS — E861 Hypovolemia: Secondary | ICD-10-CM | POA: Diagnosis present

## 2021-10-20 LAB — GASTROINTESTINAL PANEL BY PCR, STOOL (REPLACES STOOL CULTURE)

## 2021-10-20 LAB — COMPREHENSIVE METABOLIC PANEL
ALT: 13 U/L (ref 0–44)
AST: 19 U/L (ref 15–41)
Albumin: 3 g/dL — ABNORMAL LOW (ref 3.5–5.0)
Alkaline Phosphatase: 37 U/L — ABNORMAL LOW (ref 38–126)
Anion gap: 7 (ref 5–15)
BUN: 8 mg/dL (ref 6–20)
CO2: 27 mmol/L (ref 22–32)
Calcium: 8 mg/dL — ABNORMAL LOW (ref 8.9–10.3)
Chloride: 101 mmol/L (ref 98–111)
Creatinine, Ser: 0.73 mg/dL (ref 0.44–1.00)
GFR, Estimated: >60 mL/min (ref >60)
Glucose, Bld: 103 mg/dL — ABNORMAL HIGH (ref 70–99)
Potassium: 3.2 mmol/L — ABNORMAL LOW (ref 3.5–5.1)
Sodium: 135 mmol/L (ref 135–145)
Total Bilirubin: 0.5 mg/dL (ref 0.3–1.2)
Total Protein: 5.6 g/dL — ABNORMAL LOW (ref 6.5–8.1)

## 2021-10-20 LAB — OSMOLALITY: Osmolality: 283 mOsm/kg (ref 275–295)

## 2021-10-20 LAB — CBC WITH DIFFERENTIAL/PLATELET
Abs Immature Granulocytes: 0.01 10*3/uL (ref 0.00–0.07)
Basophils Absolute: 0 10*3/uL (ref 0.0–0.1)
Basophils Relative: 0 %
Eosinophils Absolute: 0 10*3/uL (ref 0.0–0.5)
Eosinophils Relative: 0 %
HCT: 35.1 % — ABNORMAL LOW (ref 36.0–46.0)
Hemoglobin: 11.6 g/dL — ABNORMAL LOW (ref 12.0–15.0)
Immature Granulocytes: 0 %
Lymphocytes Relative: 11 %
Lymphs Abs: 0.8 10*3/uL (ref 0.7–4.0)
MCH: 30.1 pg (ref 26.0–34.0)
MCHC: 33 g/dL (ref 30.0–36.0)
MCV: 91.2 fL (ref 80.0–100.0)
Monocytes Absolute: 0.5 10*3/uL (ref 0.1–1.0)
Monocytes Relative: 6 %
Neutro Abs: 6.3 10*3/uL (ref 1.7–7.7)
Neutrophils Relative %: 83 %
Platelets: 192 10*3/uL (ref 150–400)
RBC: 3.85 MIL/uL — ABNORMAL LOW (ref 3.87–5.11)
RDW: 13.8 % (ref 11.5–15.5)
WBC: 7.7 10*3/uL (ref 4.0–10.5)
nRBC: 0 % (ref 0.0–0.2)

## 2021-10-20 LAB — C DIFFICILE QUICK SCREEN W PCR REFLEX
C Diff antigen: POSITIVE — AB
C Diff interpretation: DETECTED
C Diff toxin: POSITIVE — AB

## 2021-10-20 LAB — URINALYSIS, COMPLETE (UACMP) WITH MICROSCOPIC
Bilirubin Urine: NEGATIVE
Glucose, UA: NEGATIVE mg/dL
Ketones, ur: NEGATIVE mg/dL
Nitrite: NEGATIVE
Protein, ur: NEGATIVE mg/dL
Specific Gravity, Urine: 1.002 — ABNORMAL LOW (ref 1.005–1.030)
pH: 6 (ref 5.0–8.0)

## 2021-10-20 LAB — RESP PANEL BY RT-PCR (FLU A&B, COVID) ARPGX2
Influenza A by PCR: NEGATIVE
Influenza B by PCR: NEGATIVE
SARS Coronavirus 2 by RT PCR: NEGATIVE

## 2021-10-20 LAB — LACTIC ACID, PLASMA: Lactic Acid, Venous: 0.8 mmol/L (ref 0.5–1.9)

## 2021-10-20 LAB — OSMOLALITY, URINE: Osmolality, Ur: 38 mOsm/kg — ABNORMAL LOW (ref 300–900)

## 2021-10-20 LAB — MAGNESIUM: Magnesium: 1.6 mg/dL — ABNORMAL LOW (ref 1.7–2.4)

## 2021-10-20 LAB — TSH: TSH: 2.586 u[IU]/mL (ref 0.350–4.500)

## 2021-10-20 LAB — SODIUM, URINE, RANDOM: Sodium, Ur: <10 mmol/L

## 2021-10-20 MED ORDER — DICYCLOMINE HCL 10 MG PO CAPS
10.0000 mg | ORAL_CAPSULE | Freq: Three times a day (TID) | ORAL | Status: DC
  Administered 2021-10-20 – 2021-10-27 (×21): 10 mg via ORAL
  Filled 2021-10-20 (×21): qty 1

## 2021-10-20 MED ORDER — ONDANSETRON HCL 4 MG/2ML IJ SOLN
4.0000 mg | Freq: Four times a day (QID) | INTRAMUSCULAR | Status: DC | PRN
  Administered 2021-10-20: 06:00:00 4 mg via INTRAVENOUS
  Filled 2021-10-20: qty 2

## 2021-10-20 MED ORDER — DEXTROSE 5 % IV SOLN
3.0000 g | Freq: Once | INTRAVENOUS | Status: AC
  Administered 2021-10-20: 09:00:00 3 g via INTRAVENOUS
  Filled 2021-10-20: qty 6

## 2021-10-20 MED ORDER — POTASSIUM CHLORIDE 10 MEQ/100ML IV SOLN
10.0000 meq | INTRAVENOUS | Status: AC
  Administered 2021-10-20 (×6): 10 meq via INTRAVENOUS
  Filled 2021-10-20: qty 100

## 2021-10-20 MED ORDER — FLUTICASONE PROPIONATE 50 MCG/ACT NA SUSP
2.0000 | Freq: Every day | NASAL | Status: DC
  Administered 2021-10-20 – 2021-10-27 (×8): 2 via NASAL
  Filled 2021-10-20: qty 16

## 2021-10-20 MED ORDER — LACTATED RINGERS IV SOLN: INTRAVENOUS | Status: AC

## 2021-10-20 MED ORDER — ACETAMINOPHEN 325 MG PO TABS
650.0000 mg | ORAL_TABLET | Freq: Four times a day (QID) | ORAL | Status: DC | PRN
  Administered 2021-10-20: 22:00:00 650 mg via ORAL
  Filled 2021-10-20 (×2): qty 2

## 2021-10-20 MED ORDER — VANCOMYCIN HCL 125 MG PO CAPS
125.0000 mg | ORAL_CAPSULE | Freq: Four times a day (QID) | ORAL | Status: DC
  Administered 2021-10-20 – 2021-10-27 (×29): 125 mg via ORAL
  Filled 2021-10-20 (×30): qty 1

## 2021-10-20 MED ORDER — ACETAMINOPHEN 650 MG RE SUPP: 650.0000 mg | Freq: Four times a day (QID) | RECTAL | Status: DC | PRN

## 2021-10-20 NOTE — H&P (Signed)
History and Physical    PLEASE NOTE THAT DRAGON DICTATION SOFTWARE WAS USED IN THE CONSTRUCTION OF THIS NOTE.   Jacqueline Hughes:[id number] DOB: 02/09/1966 DOA: 10/19/2021  PCP: Donella Stade, PA-C  Patient coming from: home   I have personally briefly reviewed patient's old medical records in [place]  Chief Complaint: Diarrhea  HPI: Jacqueline Hughes is a 55 y.o. female with medical history significant for acquired hypothyroidism, allergic rhinitis, GAD who is admitted to [facility] on 10/19/2021 by way of transfer from [facility] with sepsis due to C. difficile colitis after presenting from home to the latter facility complaining of diarrhea.  T patient reports 2 weeks of loose stool, that is becoming progressively more frequent over that timeframe.  Over the last 3 to 4 days, this increase in frequency has resulted in at least 10-15 episodes per day of loose stool in the absence of any melena or hematochezia.  She notes diffuse crampy abdominal discomfort that worsens with palpation over the abdomen.  She also reports intermittent nausea in the absence of any vomiting.  For the last 1 to 2 days she has also developed an objective fever, noting temperature max of 102 at home earlier today.  Denies any associated chills, full body rigors, or generalized myalgias.  Denies any history of known inflammatory bowel disease, including no history of Crohn's or ulcerative colitis.  She also denies any known history of autoimmune related enterocolitis, including no history of celiac's disease.  Denies any recent traveling, but reports that she completed a course of Augmentin approximately 1 month ago as treatment for acute sinusitis.  Denies any use of proton pump inhibitors at home.  Denies any recent chest pain, shortness of breath, palpitations, diaphoresis.  Not associate with any neck stiffness, rash, dysuria, gross hematuria     ED Course:  Vital signs in the ED  were notable for the following: Temperature max 99.6; initial heart rate 132, which improved to 94 following interval IV fluids; initial blood pressure 96/63, which increased to 107/72; respiratory rate 16-18, oxygen saturation 95 to 97% on room air.  Labs were notable for the following: CMP notable for the following: Sodium 129 compared to most recent prior value of 139 on 09/21/2021, bicarbonate 27, anion gap 7, creatinine 0.75 compared to 0.69 on 09/21/2021, glucose 127, calcium, corrected for mild hypoalbuminemia noted to be 8.6, albumin 3.4, otherwise liver enzymes found to be within normal limits.  Lipase 22.  CBC notable for white cell count 12,400 with 87% neutrophils, hemoglobin 13.  C. difficile antigen/toxin found to be positive when tested in the ED today.  COVID-19/influenza PCR negative.  Blood cultures x2 collected.  Imaging and additional notable ED work-up: CT abdomen/pelvis with contrast showed pancolitis, felt to be consistent with infectious versus inflammatory colitis, in the absence of any evidence of perforation, obstruction, or abscess.  While in the ED, the following were administered: Bentyl 20 mg IM x1, Zofran 4 mg IV x1, and p.o. vancomycin.  Subsequently, the patient was admitted to [facility] for observation for further evaluation management of sepsis due to C. difficile colitis.     Review of Systems: As per HPI otherwise 10 point review of systems negative.   Past Medical History:  Diagnosis Date   ADD (attention deficit disorder)    Anxiety    Depression    Hyperlipidemia    Osteopenia    Thyroid disease    Vitamin D deficiency  Past Surgical History:  Procedure Laterality Date   ABDOMINAL HYSTERECTOMY     AUGMENTATION MAMMAPLASTY     BREAST SURGERY     LAPAROTOMY      Social History:  reports that she quit smoking about 17 years ago. Her smoking use included cigarettes. She has never used smokeless tobacco. She reports current alcohol use. She  reports that she does not use drugs.   Allergies  Allergen Reactions   E-Mycin [Erythromycin]    Lexapro [Escitalopram Oxalate]     Confused and disoriented.    Covid-19 Mrna Vaccine [employer]) [Covid-19 Mrna Vacc (Moderna)] Rash    Started on prednisone last night- vaccine on Saturday    Family History  Problem Relation Age of Onset   Alcoholism Other        granparents   Depression Other        aunts    Family history reviewed and not pertinent    Prior to Admission medications   Medication Sig Start Date End Date Taking? Authorizing Provider  ALPRAZolam Duanne Moron) 0.5 MG tablet Take 1 tablet (0.5 mg total) by mouth at bedtime as needed for anxiety. 03/18/19   Breeback, Jade L, PA-C  amphetamine-dextroamphetamine (ADDERALL XR) 15 MG 24 hr capsule Take 1 capsule by mouth every morning. 10/21/21   Donella Stade, PA-C  amphetamine-dextroamphetamine (ADDERALL XR) 15 MG 24 hr capsule Take 1 capsule by mouth every morning. 11/20/21   Breeback, Jade L, PA-C  amphetamine-dextroamphetamine (ADDERALL XR) 15 MG 24 hr capsule Take 1 capsule by mouth every morning. 09/21/21   Breeback, Royetta Car, PA-C  doxycycline (VIBRA-TABS) 100 MG tablet Take 1 tablet (100 mg total) by mouth 2 (two) times daily for 7 days. 10/18/21 10/25/21  Luetta Nutting, DO  FLUoxetine (PROZAC) 20 MG capsule Take 1 capsule (20 mg total) by mouth daily. 09/21/21   Breeback, Jade L, PA-C  fluticasone (FLONASE) 50 MCG/ACT nasal spray Place 2 sprays into both nostrils daily. 10/07/21 10/07/22  Terrilyn Saver, NP  ondansetron (ZOFRAN) 4 MG tablet Take 1 tablet (4 mg total) by mouth every 8 (eight) hours as needed for nausea or vomiting. 10/18/21   Luetta Nutting, DO  Vitamin D, Ergocalciferol, (DRISDOL) 1.25 MG (50000 UNIT) CAPS capsule Take 1 capsule (50,000 Units total) by mouth every 7 (seven) days. 09/21/21   Donella Stade, PA-C     Objective    Physical Exam: Vitals:   10/19/21 2230 10/20/21 0019 10/20/21 0245 10/20/21  0331  BP: 101/70 99/67  (!) 92/59  Pulse: (!) 106 100 94 100  Resp: _0 Temp:    99.3 F (37.4 C)  TempSrc:    Oral  SpO2: 95% 95% 93% 95%  Weight:      Height:        General: appears to be stated age; alert, oriented Skin: warm, dry, no rash Head:  AT/[place] Mouth:  Oral mucosa membranes appear dry, normal dentition Neck: supple; trachea midline Heart:  RRR; did not appreciate any M/R/G Lungs: CTAB, did not appreciate any wheezes, rales, or rhonchi Abdomen: + BS; soft, ND,; mild diffuse tenderness in the absence of associated guarding, rigidity, or rebound tenderness. Vascular: 2+ pedal pulses b/l; 2+ radial pulses b/l Extremities: no peripheral edema, no muscle wasting Neuro: strength and sensation intact in upper and lower extremities b/l   Labs on Admission: I have personally reviewed following labs and imaging studies  CBC: Recent Labs  Lab 10/18/21 0000 10/19/21 2112  WBC  12.2* 12.4*  NEUTROABS 10,882* 10.6*  HGB 13.5 13.0  HCT 39.8 39.2  MCV 89.6 90.5  PLT 310 235   Basic Metabolic Panel: Recent Labs  Lab 10/18/21 0000 10/19/21 2112  NA 136 129*  K 4.1 3.5  CL 99 95*  CO2 26 27  GLUCOSE 85 127*  BUN 9 12  CREATININE 0.74 0.75  CALCIUM 8.7 8.2*  MG  --  1.7   GFR: Estimated Creatinine Clearance: 81.1 mL/min (by C-G formula based on SCr of 0.75 mg/dL). Liver Function Tests: Recent Labs  Lab 10/18/21 0000 10/19/21 2112  AST 18 19  ALT 12 14  ALKPHOS  --  44  BILITOT 0.5 0.6  PROT 7.1 6.7  ALBUMIN  --  3.4*   Recent Labs  Lab 10/19/21 2112  LIPASE 22   No results for input(s): AMMONIA in the last 168 hours. Coagulation Profile: No results for input(s): INR, PROTIME in the last 168 hours. Cardiac Enzymes: No results for input(s): CKTOTAL, CKMB, CKMBINDEX, TROPONINI in the last 168 hours. BNP (last 3 results) No results for input(s): PROBNP in the last 8760 hours. HbA1C: No results for input(s): HGBA1C in the last 72  hours. CBG: No results for input(s): GLUCAP in the last 168 hours. Lipid Profile: No results for input(s): CHOL, HDL, LDLCALC, TRIG, CHOLHDL, LDLDIRECT in the last 72 hours. Thyroid Function Tests: No results for input(s): TSH, T4TOTAL, FREET4, T3FREE, THYROIDAB in the last 72 hours. Anemia Panel: No results for input(s): VITAMINB12, FOLATE, FERRITIN, TIBC, IRON, RETICCTPCT in the last 72 hours. Urine analysis:    Component Value Date/Time   COLORURINE YELLOW 06/03/2020 [place] 06/03/2020 1525   LABSPEC 1.005 06/03/2020 1525   PHURINE 6.5 06/03/2020 1525   GLUCOSEU NEGATIVE 06/03/2020 1525   HGBUR NEGATIVE 06/03/2020 1525   [place] 06/03/2020 1525   PROTEINUR NEGATIVE 06/03/2020 1525   NITRITE NEGATIVE 06/03/2020 1525   LEUKOCYTESUR NEGATIVE 06/03/2020 1525    Radiological Exams on Admission: CT ABDOMEN PELVIS W CONTRAST  Result Date: 10/19/2021 CLINICAL DATA:  Diarrhea Abdominal pain, acute, nonlocalized EXAM: CT ABDOMEN AND PELVIS WITH CONTRAST TECHNIQUE: Multidetector CT imaging of the abdomen and pelvis was performed using the standard protocol following bolus administration of intravenous contrast. CONTRAST:  167mL OMNIPAQUE IOHEXOL 300 MG/ML  SOLN COMPARISON:  None. FINDINGS: Lower chest: No acute abnormality. Bilateral breast implants are noted partially. Hepatobiliary: No focal liver abnormality. No gallstones, gallbladder wall thickening, or pericholecystic fluid. No biliary dilatation. Pancreas: No focal lesion. Normal pancreatic contour. No surrounding inflammatory changes. No main pancreatic ductal dilatation. Spleen: Normal in size without focal abnormality. Adrenals/Urinary Tract: No adrenal nodule bilaterally. Bilateral kidneys enhance symmetrically. No hydronephrosis. No hydroureter. The urinary bladder is unremarkable. On delayed imaging, there is no urothelial wall thickening and there are no filling defects in the opacified portions of the  bilateral collecting systems or ureters. Stomach/Bowel: Stomach is within normal limits. No evidence of small bowel wall thickening or dilatation. The colon demonstrates bowel wall thickening, mucosal hyperemia, pericolonic fat stranding. Appendix appears normal. Vascular/Lymphatic: No abdominal aorta or iliac aneurysm. No abdominal, pelvic, or inguinal lymphadenopathy. Reproductive: Complex appearing 7.9 x 5.8 cm left adnexal lesion. The right adnexa/ovary is grossly unremarkable. Status post hysterectomy. Other: Trace simple free fluid ascites. No intraperitoneal free gas. No organized fluid collection. Musculoskeletal: No abdominal wall hernia or abnormality. No suspicious lytic or blastic osseous lesions. No acute displaced fracture. Multilevel degenerative changes of the spine. IMPRESSION: 1. Pancolitis. Differential  diagnosis for etiology includes infection or inflammation. No associated bowel perforation or obstruction. 2. Complex appearing 7.9 x 5.8 cm left adnexal lesion. Recommend pelvic ultrasound and gynecologic consultation. 3. Status post hysterectomy. 4. Trace simple free fluid ascites. Electronically Signed   By: Iven Finn M.D.   On: 10/19/2021 23:06       Assessment/Plan    Principal Problem:   C. difficile colitis Active Problems:   Diarrhea   Pancolitis (HCC)   Sepsis (HCC)   Leukocytosis   Acute hyponatremia   Thyroid disease     #) C. difficile colitis: Diagnosis on the basis of 2 weeks of progressive loose stool, now with episodes in the frequency of 10-15 episodes per day with diffuse crampy abdominal discomfort, and CT abdomen/pelvis showing evidence of pancolitis, consistent with infectious process, in the absence of any evidence to suggest perforation, obstruction, or abscess, with C. difficile antigen/toxin positive findings earlier this evening.  This is in the context of completion of course of Augmentin 1 month ago, increasing risk for ensuing development of  pseudomembranous colitis.  Denies any use of PPIs as an outpatient.  P.o. vancomycin initiated this evening.  Of note, physical exam reveals no evidence of acute peritoneal signs at this time.  Plan: Continue p.o. vancomycin continue with IV fluids.  Repeat CMP, CBC in the morning.  Add on serum magnesium level.  As needed Zofran.  Refraining from antimotility agents.      #) Sepsis due to C. difficile colitis: In the setting of presenting C. difficile colitis, as above, SIRS criteria met via leukocytosis and tachycardia. Will check LA. Of note, in the absence of any evidence of end organ damage, including no current lactate greater than or equal to 4.0 and in the absence of any associated hypotension refractory to IVF's, there are no indications for administration of a 30 mL/kg IVF bolus at this time. Additional ED work-up/management notable for: Blood cultures x2 collected, followed by initiation of oral vancomycin.  No e/o additional infectious process at this time, including COVID-19/influenza negative.  Will check urinalysis.  No acute respiratory symptoms to evaluate via chest x-ray at this time.    plan: CBC w/ diff in AM. Follow for results of blood cx's x 2. Abx: Oral vancomycin.  Continuous IV fluids, as above.  Check lactate.  Repeat CMP.  Check UA.        #) Acute hypo-osmolar hypovolemic hyponatremia: Presenting serum sodium 129 compared to most recent prior value of 139 on 09/21/2021. Suspect an element of hypovolumeia, with suspected contribution from dehydration in the setting of clinical evidence of such as well as report of recent decline in oral intake and increase GI loss diarrhea coinciding with the timeframe of decline of serum sodium levels.  We will continue IV fluids, while closely monitoring ensuing serum sodium trend while further evaluating for any additional contributing factors leading to her presenting acute hyponatremia.  Of note, there may be a peripheral  contribution from fluoxetine, although this does not appear to be the primary driving factor of this electrolyte abnormality.  Of note, no evidence of associated acute focal neurologic deficits and no report of recent trauma.    Plan: monitor strict I's and O's and daily weights.  check UA, random urine sodium, urine osmolality.  Check serum osmolality to confirm suspected hypoosmolar etiology.  Repeat BMP in the morning. Check TSH. Gentle IVF's, as above.  Hold home fluoxetine for now.        #)  acquired hypothyroidism: documented h/o such, although not currently on any supplementation at home.  Plan: Check TSH as component of evaluation for presenting acute hyponatremia.       #) Allergic Rhinitis: documented h/o such, on scheduled intranasal Flonase as outpatient.    Plan: cont home Flonase.        DVT prophylaxis: SCD's   Code Status: Full code Family Communication: none Disposition Plan: Per Rounding Team Consults called: none;  Admission status: Observation; MedSurg   PLEASE NOTE THAT DRAGON DICTATION SOFTWARE WAS USED IN THE CONSTRUCTION OF THIS NOTE.   Marion DO [facility] [place]   10/20/2021, 3:56 AM

## 2021-10-20 NOTE — ED Notes (Signed)
Report given to [facility]. 

## 2021-10-20 NOTE — Progress Notes (Signed)
PROGRESS NOTE    Jacqueline Hughes  [name] DOB: 23-Sep-1966 DOA: 10/19/2021 PCP: Jacqueline Stade, PA-C   Brief Narrative:  [name] PMHx Acquired hypothyroidism, allergic rhinitis, GAD   Admitted to [facility] on 10/19/2021 by way of transfer from [place] ED with sepsis due to C. difficile colitis after presenting from home to the latter facility complaining of diarrhea.   T patient reports 2 weeks of loose stool, that is becoming progressively more frequent over that timeframe.  Over the last 3 to 4 days, this increase in frequency has resulted in at least 10-15 episodes per day of loose stool in the absence of any melena or hematochezia.  She notes diffuse crampy abdominal discomfort that worsens with palpation over the abdomen.  She also reports intermittent nausea in the absence of any vomiting.  For the last 1 to 2 days she has also developed an objective fever, noting temperature max of 102 at home earlier today.  Denies any associated chills, full body rigors, or generalized myalgias.  Denies any history of known inflammatory bowel disease, including no history of Crohn's or ulcerative colitis.  She also denies any known history of autoimmune related enterocolitis, including no history of celiac's disease.   Denies any recent traveling, but reports that she completed a course of Augmentin approximately 1 month ago as treatment for acute sinusitis.  Denies any use of proton pump inhibitors at home.   Denies any recent chest pain, shortness of breath, palpitations, diaphoresis.  Not associate with any neck stiffness, rash, dysuria, gross hematuria   Imaging and additional notable ED work-up: CT abdomen/pelvis with contrast showed pancolitis, felt to be consistent with infectious versus inflammatory colitis, in the absence of any evidence of perforation, obstruction, or abscess.   While in the ED, the following were administered: Bentyl 20 mg IM x1, Zofran 4 mg IV  x1, and p.o. vancomycin.  Subsequently, the patient was admitted to [facility] for observation for further evaluation management of sepsis due to C. difficile colitis.  Subjective: A/O x4, states continues to have abdominal pain described as cramping.  Positive diarrhea.  Positive nausea, negative vomiting   Assessment & Plan:  Covid vaccination;  Principal Problem:   C. difficile colitis Active Problems:   Diarrhea   Pancolitis ([place])   Sepsis ([place])   Leukocytosis   Acute hyponatremia   Thyroid disease      C. difficile colitis - CT abdomen/pelvis showing evidence of pancolitis, consistent with infectious process. C. difficile antigen/toxin positive findings earlier this evening.   -This is in the context of completion of course of Augmentin 1 month ago, increasing risk for ensuing development of pseudomembranous colitis.   -Continue vancomycin p.o.  -LR 127ml/hr   Sepsis - See C. difficile colitis   Acute hypoosmolar hypovolemic hyponatremia -Strict INO - Daily weight -Resolved  Acquired hypothyroidism - 12/21 TSH= 2.5 -Patient's home meds do not reflect patient being on Synthroid.    Hypokalemia -Potassium goal> 4 - 12/21 potassium IV 60 mEq   Hypomagnesmia - Magnesium goal> 2 - 12/21 magnesium IV 3 g    DVT prophylaxis: SCD Code Status: Full Family Communication:  Status is: Inpatient    Dispo: The patient is from: Home              Anticipated d/c is to: Home              Anticipated d/c date is: 3 days  Patient currently is not medically stable to d/c.      Consultants:     Procedures/Significant Events:     I have personally reviewed and interpreted all radiology studies and my findings are as above.  VENTILATOR SETTINGS:    Cultures   Antimicrobials: Anti-infectives (From admission, onward)    Start     Dose/Rate Route Frequency Ordered Stop   10/20/21 1000  vancomycin (VANCOCIN) capsule 125 mg        125 mg Oral  4 times daily 10/20/21 0031 10/30/21 0272         Devices    LINES / TUBES:      Continuous Infusions:  lactated ringers 125 mL/hr at 10/20/21 0019     Objective: Vitals:   10/20/21 0019 10/20/21 0245 10/20/21 0331 10/20/21 0615  BP: 99/67  (!) 92/59 93/64  Pulse: 100 94 100 93  Resp: 15  18 18   Temp:   99.3 F (37.4 C) 99.6 F (37.6 C)  TempSrc:   Oral Oral  SpO2: 95% 93% 95% 96%  Weight:      Height:        Intake/Output Summary (Last 24 hours) at 10/20/2021 0745 Last data filed at 10/20/2021 0600 Gross per 24 hour  Intake 1319.42 ml  Output --  Net 1319.42 ml   Filed Weights   10/19/21 1844  Weight: 67.1 kg    Examination:  General: A/O x4 No acute respiratory distress Eyes: negative scleral hemorrhage, negative anisocoria, negative icterus ENT: Negative Runny nose, negative gingival bleeding, Neck:  Negative scars, masses, torticollis, lymphadenopathy, JVD Lungs: Clear to auscultation bilaterally without wheezes or crackles Cardiovascular: Regular rate and rhythm without murmur gallop or rub normal S1 and S2 Abdomen: Positive abdominal pain, negative nondistended, positive soft, bowel sounds, no rebound, no ascites, no appreciable mass Extremities: No significant cyanosis, clubbing, or edema bilateral lower extremities Skin: Negative rashes, lesions, ulcers Psychiatric:  Negative depression, negative anxiety, negative fatigue, negative mania  Central nervous system:  Cranial nerves II through XII intact, tongue/uvula midline, all extremities muscle strength 5/5, sensation intact throughout, negative dysarthria, negative expressive aphasia, negative receptive aphasia.  .     Data Reviewed: Care during the described time interval was provided by me .  I have reviewed this patient's available data, including medical history, events of note, physical examination, and all test results as part of my evaluation.   CBC: Recent Labs  Lab 10/18/21 0000  10/19/21 2112 10/20/21 0446  WBC 12.2* 12.4* 7.7  NEUTROABS 10,882* 10.6* 6.3  HGB 13.5 13.0 11.6*  HCT 39.8 39.2 35.1*  MCV 89.6 90.5 91.2  PLT 310 247 536   Basic Metabolic Panel: Recent Labs  Lab 10/18/21 0000 10/19/21 2112 10/20/21 0446  NA 136 129* 135  K 4.1 3.5 3.2*  CL 99 95* 101  CO2 26 27 27   GLUCOSE 85 127* 103*  BUN 9 12 8   CREATININE 0.74 0.75 0.73  CALCIUM 8.7 8.2* 8.0*  MG  --  1.7 1.6*   GFR: Estimated Creatinine Clearance: 81.1 mL/min (by C-G formula based on SCr of 0.73 mg/dL). Liver Function Tests: Recent Labs  Lab 10/18/21 0000 10/19/21 2112 10/20/21 0446  AST 18 19 19   ALT 12 14 13   ALKPHOS  --  44 37*  BILITOT 0.5 0.6 0.5  PROT 7.1 6.7 5.6*  ALBUMIN  --  3.4* 3.0*   Recent Labs  Lab 10/19/21 2112  LIPASE 22   No results for input(s): AMMONIA  in the last 168 hours. Coagulation Profile: No results for input(s): INR, PROTIME in the last 168 hours. Cardiac Enzymes: No results for input(s): CKTOTAL, CKMB, CKMBINDEX, TROPONINI in the last 168 hours. BNP (last 3 results) No results for input(s): PROBNP in the last 8760 hours. HbA1C: No results for input(s): HGBA1C in the last 72 hours. CBG: No results for input(s): GLUCAP in the last 168 hours. Lipid Profile: No results for input(s): CHOL, HDL, LDLCALC, TRIG, CHOLHDL, LDLDIRECT in the last 72 hours. Thyroid Function Tests: Recent Labs    10/20/21 0446  TSH 2.586   Anemia Panel: No results for input(s): VITAMINB12, FOLATE, FERRITIN, TIBC, IRON, RETICCTPCT in the last 72 hours. Urine analysis:    Component Value Date/Time   COLORURINE STRAW (A) 10/20/2021 0618   APPEARANCEUR CLEAR 10/20/2021 0618   LABSPEC 1.002 (L) 10/20/2021 0618   PHURINE 6.0 10/20/2021 0618   GLUCOSEU NEGATIVE 10/20/2021 0618   HGBUR MODERATE (A) 10/20/2021 0618   BILIRUBINUR NEGATIVE 10/20/2021 0618   KETONESUR NEGATIVE 10/20/2021 0618   PROTEINUR NEGATIVE 10/20/2021 0618   NITRITE NEGATIVE 10/20/2021  0618   LEUKOCYTESUR LARGE (A) 10/20/2021 0618   Sepsis Labs: @LABRCNTIP (procalcitonin:4,lacticidven:4)  ) Recent Results (from the past 240 hour(s))  C Difficile Quick Screen w PCR reflex     Status: Abnormal   Collection Time: 10/19/21  8:50 PM   Specimen: STOOL  Result Value Ref Range Status   C Diff antigen POSITIVE (A) NEGATIVE Final    Comment: CRITICAL RESULT CALLED TO, READ BACK BY AND VERIFIED WITH:  KELLY NEAL RN 10/20/2021 @0028  BY JW    C Diff toxin POSITIVE (A) NEGATIVE Final   C Diff interpretation Toxin producing C. difficile detected.  Final    Comment: Performed at [facility], [place] [address] [place], [place] [address]  Resp Panel by RT-PCR (Flu A&B, Covid)     Status: None   Collection Time: 10/20/21 12:16 AM   Specimen: Nasopharyngeal(NP) swabs in vial transport medium  Result Value Ref Range Status   SARS Coronavirus 2 by RT PCR NEGATIVE NEGATIVE Final    Comment: (NOTE) SARS-CoV-2 target nucleic acids are NOT DETECTED.  The SARS-CoV-2 RNA is generally detectable in upper respiratory specimens during the acute phase of infection. The lowest concentration of SARS-CoV-2 viral copies this assay can detect is 138 copies/mL. A negative result does not preclude SARS-Cov-2 infection and should not be used as the sole basis for treatment or other patient management decisions. A negative result may occur with  improper specimen collection/handling, submission of specimen other than nasopharyngeal swab, presence of viral mutation(s) within the areas targeted by this assay, and inadequate number of viral copies(<138 copies/mL). A negative result must be combined with clinical observations, patient history, and epidemiological information. The expected result is Negative.  Fact Sheet for Patients:  EntrepreneurPulse.com.au  Fact Sheet for Healthcare Providers:  IncredibleEmployment.be  This test is no t yet approved or  cleared by the [place] FDA and  has been authorized for detection and/or diagnosis of SARS-CoV-2 by FDA under an Emergency Use Authorization (EUA). This EUA will remain  in effect (meaning this test can be used) for the duration of the COVID-19 declaration under Section 564(b)(1) of the Act, 21 U.S.C.section 360bbb-3(b)(1), unless the authorization is terminated  or revoked sooner.       Influenza A by PCR NEGATIVE NEGATIVE Final   Influenza B by PCR NEGATIVE NEGATIVE Final    Comment: (NOTE) The Xpert Xpress SARS-CoV-2/FLU/RSV  plus assay is intended as an aid in the diagnosis of influenza from Nasopharyngeal swab specimens and should not be used as a sole basis for treatment. Nasal washings and aspirates are unacceptable for Xpert Xpress SARS-CoV-2/FLU/RSV testing.  Fact Sheet for Patients: EntrepreneurPulse.com.au  Fact Sheet for Healthcare Providers: IncredibleEmployment.be  This test is not yet approved or cleared by the [place] FDA and has been authorized for detection and/or diagnosis of SARS-CoV-2 by FDA under an Emergency Use Authorization (EUA). This EUA will remain in effect (meaning this test can be used) for the duration of the COVID-19 declaration under Section 564(b)(1) of the Act, 21 U.S.C. section 360bbb-3(b)(1), unless the authorization is terminated or revoked.  Performed at [facility], [address] [place], [place] [address]          Radiology Studies: CT ABDOMEN PELVIS W CONTRAST  Result Date: 10/19/2021 CLINICAL DATA:  Diarrhea Abdominal pain, acute, nonlocalized EXAM: CT ABDOMEN AND PELVIS WITH CONTRAST TECHNIQUE: Multidetector CT imaging of the abdomen and pelvis was performed using the standard protocol following bolus administration of intravenous contrast. CONTRAST:  119mL OMNIPAQUE IOHEXOL 300 MG/ML  SOLN COMPARISON:  None. FINDINGS: Lower chest: No acute abnormality. Bilateral  breast implants are noted partially. Hepatobiliary: No focal liver abnormality. No gallstones, gallbladder wall thickening, or pericholecystic fluid. No biliary dilatation. Pancreas: No focal lesion. Normal pancreatic contour. No surrounding inflammatory changes. No main pancreatic ductal dilatation. Spleen: Normal in size without focal abnormality. Adrenals/Urinary Tract: No adrenal nodule bilaterally. Bilateral kidneys enhance symmetrically. No hydronephrosis. No hydroureter. The urinary bladder is unremarkable. On delayed imaging, there is no urothelial wall thickening and there are no filling defects in the opacified portions of the bilateral collecting systems or ureters. Stomach/Bowel: Stomach is within normal limits. No evidence of small bowel wall thickening or dilatation. The colon demonstrates bowel wall thickening, mucosal hyperemia, pericolonic fat stranding. Appendix appears normal. Vascular/Lymphatic: No abdominal aorta or iliac aneurysm. No abdominal, pelvic, or inguinal lymphadenopathy. Reproductive: Complex appearing 7.9 x 5.8 cm left adnexal lesion. The right adnexa/ovary is grossly unremarkable. Status post hysterectomy. Other: Trace simple free fluid ascites. No intraperitoneal free gas. No organized fluid collection. Musculoskeletal: No abdominal wall hernia or abnormality. No suspicious lytic or blastic osseous lesions. No acute displaced fracture. Multilevel degenerative changes of the spine. IMPRESSION: 1. Pancolitis. Differential diagnosis for etiology includes infection or inflammation. No associated bowel perforation or obstruction. 2. Complex appearing 7.9 x 5.8 cm left adnexal lesion. Recommend pelvic ultrasound and gynecologic consultation. 3. Status post hysterectomy. 4. Trace simple free fluid ascites. Electronically Signed   By: Iven Finn M.D.   On: 10/19/2021 23:06        Scheduled Meds:  fluticasone  2 spray Each Nare Daily   vancomycin  125 mg Oral QID   Continuous  Infusions:  lactated ringers 125 mL/hr at 10/20/21 0019     LOS: 0 days   The patient is critically ill with multiple organ systems failure and requires high complexity decision making for assessment and support, frequent evaluation and titration of therapies, application of advanced monitoring technologies and extensive interpretation of multiple databases. Critical Care Time devoted to patient care services described in this note  Time spent: 40 minutes     Nijah Orlich, Geraldo Docker, MD [facility]   If 7PM-7AM, please contact night-coverage 10/20/2021, 7:45 AM

## 2021-10-20 NOTE — Progress Notes (Signed)
°  Transition of Care (TOC) Screening Note   Patient Details  Name: Jacqueline Hughes Date of Birth: 1966-08-10   Transition of Care [facility]) CM/SW Contact:    Lennart Pall, LCSW Phone Number: 10/20/2021, 11:48 AM    Transition of Care Department [facility]) has reviewed patient and no TOC needs have been identified at this time. We will continue to monitor patient advancement through interdisciplinary progression rounds. If new patient transition needs arise, please place a TOC consult.  Freddie Dymek, LCSW

## 2021-10-20 NOTE — ED Notes (Signed)
[facility] at bedside 

## 2021-10-21 MED ORDER — NYSTATIN 100000 UNIT/ML MT SUSP
5.0000 mL | Freq: Four times a day (QID) | OROMUCOSAL | Status: DC
  Administered 2021-10-21 – 2021-10-27 (×23): 500000 [IU] via ORAL
  Filled 2021-10-21 (×23): qty 5

## 2021-10-21 MED ORDER — LIP MEDEX EX OINT
TOPICAL_OINTMENT | CUTANEOUS | Status: DC | PRN
  Administered 2021-10-21: 75 via TOPICAL
  Filled 2021-10-21: qty 7

## 2021-10-21 MED ORDER — RISAQUAD PO CAPS
2.0000 | ORAL_CAPSULE | Freq: Three times a day (TID) | ORAL | Status: DC
  Administered 2021-10-21 – 2021-10-27 (×17): 2 via ORAL
  Filled 2021-10-21 (×17): qty 2

## 2021-10-21 MED ORDER — IBUPROFEN 200 MG PO TABS
200.0000 mg | ORAL_TABLET | Freq: Once | ORAL | Status: AC | PRN
  Administered 2021-10-21: 01:00:00 200 mg via ORAL
  Filled 2021-10-21: qty 1

## 2021-10-21 NOTE — Progress Notes (Signed)
PROGRESS NOTE    Jacqueline Hughes  VEL:[id number] DOB: Dec 29, 1965 DOA: 10/19/2021 PCP: Donella Stade, PA-C   Brief Narrative:  [name] PMHx Acquired hypothyroidism, allergic rhinitis, GAD   Admitted to [facility] on 10/19/2021 by way of transfer from [place] ED with sepsis due to C. difficile colitis after presenting from home to the latter facility complaining of diarrhea.   T patient reports 2 weeks of loose stool, that is becoming progressively more frequent over that timeframe.  Over the last 3 to 4 days, this increase in frequency has resulted in at least 10-15 episodes per day of loose stool in the absence of any melena or hematochezia.  She notes diffuse crampy abdominal discomfort that worsens with palpation over the abdomen.  She also reports intermittent nausea in the absence of any vomiting.  For the last 1 to 2 days she has also developed an objective fever, noting temperature max of 102 at home earlier today.  Denies any associated chills, full body rigors, or generalized myalgias.  Denies any history of known inflammatory bowel disease, including no history of Crohn's or ulcerative colitis.  She also denies any known history of autoimmune related enterocolitis, including no history of celiac's disease.   Denies any recent traveling, but reports that she completed a course of Augmentin approximately 1 month ago as treatment for acute sinusitis.  Denies any use of proton pump inhibitors at home.   Denies any recent chest pain, shortness of breath, palpitations, diaphoresis.  Not associate with any neck stiffness, rash, dysuria, gross hematuria   Imaging and additional notable ED work-up: CT abdomen/pelvis with contrast showed pancolitis, felt to be consistent with infectious versus inflammatory colitis, in the absence of any evidence of perforation, obstruction, or abscess.   While in the ED, the following were administered: Bentyl 20 mg IM x1, Zofran 4 mg IV  x1, and p.o. vancomycin.  Subsequently, the patient was admitted to [place] for observation for further evaluation management of sepsis due to C. difficile colitis.  Subjective: 12/22 afebrile overnight sitting in bed comfortably.  Continues to have diarrhea.  States soiled herself overnight requiring a shower.  Also within the last hour 5 episodes of watery diarrhea.   Assessment & Plan:  Covid vaccination;  Principal Problem:   C. difficile colitis Active Problems:   Diarrhea   Pancolitis ([place])   Sepsis ([place])   Leukocytosis   Acute hyponatremia   Thyroid disease      C. difficile colitis - CT abdomen/pelvis showing evidence of pancolitis, consistent with infectious process. C. difficile antigen/toxin positive findings earlier this evening.   -This is in the context of completion of course of Augmentin 1 month ago, increasing risk for ensuing development of pseudomembranous colitis.   -Patient's first episode of C. difficile colitis -Continue vancomycin p.o. 125 mg QID x10 days - 12/22 Lactobacillus 2 tablet TID   Sepsis - See C. difficile colitis   Acute hypoosmolar hypovolemic hyponatremia -Strict in and out +5.5 L - Daily weight Filed Weights   10/19/21 1844  Weight: 67.1 kg  -Resolved  Acquired Hypothyroidism - 12/21 TSH= 2.5 -Patient's home meds do not reflect patient being on Synthroid.    Hypokalemia -Potassium goal> 4 - 12/21 potassium IV 60 mEq   Hypomagnesmia - Magnesium goal> 2 - 12/21 magnesium IV 3 g    DVT prophylaxis: SCD Code Status: Full Family Communication:  Status is: Inpatient    Dispo: The patient is from:  Home              Anticipated d/c is to: Home              Anticipated d/c date is: 3 days              Patient currently is not medically stable to d/c.      Consultants:     Procedures/Significant Events:     I have personally reviewed and interpreted all radiology studies and my findings are as  above.  VENTILATOR SETTINGS:    Cultures   Antimicrobials: Anti-infectives (From admission, onward)    Start     Ordered Stop   10/20/21 1000  vancomycin (VANCOCIN) capsule 125 mg        10/20/21 0031 10/30/21 0959         Devices    LINES / TUBES:      Continuous Infusions:     Objective: Vitals:   10/20/21 1328 10/20/21 1506 10/20/21 2157 10/21/21 0456  BP: 94/61 96/66 97/66  98/63  Pulse: 95 100 92 80  Resp: 18  18 18   Temp: 99.2 F (37.3 C) 99.6 F (37.6 C) 98.3 F (36.8 C) 97.8 F (36.6 C)  TempSrc: Oral Oral Oral Oral  SpO2: 96% 97% 97% 97%  Weight:      Height:        Intake/Output Summary (Last 24 hours) at 10/21/2021 1140 Last data filed at 10/21/2021 0600 Gross per 24 hour  Intake 2802.78 ml  Output --  Net 2802.78 ml    Filed Weights   10/19/21 1844  Weight: 67.1 kg    Examination:  General: A/O x4 No acute respiratory distress Eyes: negative scleral hemorrhage, negative anisocoria, negative icterus ENT: Negative Runny nose, negative gingival bleeding, Neck:  Negative scars, masses, torticollis, lymphadenopathy, JVD Lungs: Clear to auscultation bilaterally without wheezes or crackles Cardiovascular: Regular rate and rhythm without murmur gallop or rub normal S1 and S2 Abdomen: Positive abdominal pain, negative nondistended, positive soft, bowel sounds, no rebound, no ascites, no appreciable mass Extremities: No significant cyanosis, clubbing, or edema bilateral lower extremities Skin: Negative rashes, lesions, ulcers Psychiatric:  Negative depression, negative anxiety, negative fatigue, negative mania  Central nervous system:  Cranial nerves II through XII intact, tongue/uvula midline, all extremities muscle strength 5/5, sensation intact throughout, negative dysarthria, negative expressive aphasia, negative receptive aphasia.  .     Data Reviewed: Care during the described time interval was provided by me .  I have reviewed  this patient's available data, including medical history, events of note, physical examination, and all test results as part of my evaluation.   CBC: Recent Labs  Lab 10/18/21 0000 10/19/21 2112 10/20/21 0446  WBC 12.2* 12.4* 7.7  NEUTROABS 10,882* 10.6* 6.3  HGB 13.5 13.0 11.6*  HCT 39.8 39.2 35.1*  MCV 89.6 90.5 91.2  PLT 310 247 329    Basic Metabolic Panel: Recent Labs  Lab 10/18/21 0000 10/19/21 2112 10/20/21 0446  NA 136 129* 135  K 4.1 3.5 3.2*  CL 99 95* 101  CO2 26 27 27   GLUCOSE 85 127* 103*  BUN 9 12 8   CREATININE 0.74 0.75 0.73  CALCIUM 8.7 8.2* 8.0*  MG  --  1.7 1.6*    GFR: Estimated Creatinine Clearance: 81.1 mL/min (by C-G formula based on SCr of 0.73 mg/dL). Liver Function Tests: Recent Labs  Lab 10/18/21 0000 10/19/21 2112 10/20/21 0446  AST 18 19 19   ALT 12 14 13  ALKPHOS  --  44 37*  BILITOT 0.5 0.6 0.5  PROT 7.1 6.7 5.6*  ALBUMIN  --  3.4* 3.0*    Recent Labs  Lab 10/19/21 2112  LIPASE 22    No results for input(s): AMMONIA in the last 168 hours. Coagulation Profile: No results for input(s): INR, PROTIME in the last 168 hours. Cardiac Enzymes: No results for input(s): CKTOTAL, CKMB, CKMBINDEX, TROPONINI in the last 168 hours. BNP (last 3 results) No results for input(s): PROBNP in the last 8760 hours. HbA1C: No results for input(s): HGBA1C in the last 72 hours. CBG: No results for input(s): GLUCAP in the last 168 hours. Lipid Profile: No results for input(s): CHOL, HDL, LDLCALC, TRIG, CHOLHDL, LDLDIRECT in the last 72 hours. Thyroid Function Tests: Recent Labs    10/20/21 0446  TSH 2.586    Anemia Panel: No results for input(s): VITAMINB12, FOLATE, FERRITIN, TIBC, IRON, RETICCTPCT in the last 72 hours. Urine analysis:    Component Value Date/Time   COLORURINE STRAW (A) 10/20/2021 0618   APPEARANCEUR CLEAR 10/20/2021 0618   LABSPEC 1.002 (L) 10/20/2021 0618   PHURINE 6.0 10/20/2021 0618   GLUCOSEU NEGATIVE  10/20/2021 0618   HGBUR MODERATE (A) 10/20/2021 0618   BILIRUBINUR NEGATIVE 10/20/2021 0618   KETONESUR NEGATIVE 10/20/2021 0618   PROTEINUR NEGATIVE 10/20/2021 0618   NITRITE NEGATIVE 10/20/2021 0618   LEUKOCYTESUR LARGE (A) 10/20/2021 0618   Sepsis Labs: @LABRCNTIP (procalcitonin:4,lacticidven:4)  ) Recent Results (from the past 240 hour(s))  C Difficile Quick Screen w PCR reflex     Status: Abnormal   Collection Time: 10/19/21  8:50 PM   Specimen: STOOL  Result Value Ref Range Status   C Diff antigen POSITIVE (A) NEGATIVE Final    Comment: CRITICAL RESULT CALLED TO, READ BACK BY AND VERIFIED WITH:  KELLY NEAL RN 10/20/2021 @0028  BY JW    C Diff toxin POSITIVE (A) NEGATIVE Final   C Diff interpretation Toxin producing C. difficile detected.  Final    Comment: Performed at [facility], [place] [address], [place], [place] [address]  Gastrointestinal Panel by PCR , Stool     Status: None   Collection Time: 10/19/21  8:50 PM   Specimen: STOOL  Result Value Ref Range Status   Campylobacter species NOT DETECTED NOT DETECTED Final   Plesimonas shigelloides NOT DETECTED NOT DETECTED Final   Salmonella species NOT DETECTED NOT DETECTED Final   Yersinia enterocolitica NOT DETECTED NOT DETECTED Final   Vibrio species NOT DETECTED NOT DETECTED Final   Vibrio cholerae NOT DETECTED NOT DETECTED Final   Enteroaggregative E coli (EAEC) NOT DETECTED NOT DETECTED Final   Enteropathogenic E coli (EPEC) NOT DETECTED NOT DETECTED Final   Enterotoxigenic E coli (ETEC) NOT DETECTED NOT DETECTED Final   Shiga like toxin producing E coli (STEC) NOT DETECTED NOT DETECTED Final   Shigella/Enteroinvasive E coli (EIEC) NOT DETECTED NOT DETECTED Final   Cryptosporidium NOT DETECTED NOT DETECTED Final   Cyclospora cayetanensis NOT DETECTED NOT DETECTED Final   Entamoeba histolytica NOT DETECTED NOT DETECTED Final   Giardia lamblia NOT DETECTED NOT DETECTED Final   Adenovirus F40/41 NOT DETECTED NOT  DETECTED Final   Astrovirus NOT DETECTED NOT DETECTED Final   Norovirus GI/GII NOT DETECTED NOT DETECTED Final   Rotavirus A NOT DETECTED NOT DETECTED Final   Sapovirus (I, II, IV, and V) NOT DETECTED NOT DETECTED Final    Comment: Performed at [facility], [address] [place], [place] [address]  Culture, blood (routine x 2)     Status: None (Preliminary result)   Collection Time: 10/20/21 12:10 AM   Specimen: BLOOD  Result Value Ref Range Status   Specimen Description   Final    BLOOD LEFT ANTECUBITAL Performed at [facility], [address] [place], [place] [address]    Special Requests   Final    BOTTLES DRAWN AEROBIC AND ANAEROBIC Blood Culture adequate volume Performed at [facility], [address] [place], [place] [address]    Culture   Final    NO GROWTH < 24 HOURS Performed at [facility], [place] [address] [place], [place] [address]    Report Status PENDING  Incomplete  Culture, blood (routine x 2)     Status: None (Preliminary result)   Collection Time: 10/20/21 12:16 AM   Specimen: BLOOD  Result Value Ref Range Status   Specimen Description   Final    BLOOD RIGHT ANTECUBITAL Performed at [facility], [address] [place], [place] [address]    Special Requests   Final    BOTTLES DRAWN AEROBIC AND ANAEROBIC Blood Culture results may not be optimal due to an excessive volume of blood received in culture bottles Performed at [facility], [address] [place], [place] [address]    Culture   Final    NO GROWTH < 24 HOURS Performed at [facility], [place] [address] [place], [place] [address]    Report Status PENDING  Incomplete  Resp Panel by RT-PCR (Flu A&B, Covid)     Status: None   Collection Time: 10/20/21 12:16 AM   Specimen: Nasopharyngeal(NP) swabs in vial transport medium  Result Value Ref Range Status   SARS Coronavirus 2 by RT PCR NEGATIVE NEGATIVE Final    Comment:  (NOTE) SARS-CoV-2 target nucleic acids are NOT DETECTED.  The SARS-CoV-2 RNA is generally detectable in upper respiratory specimens during the acute phase of infection. The lowest concentration of SARS-CoV-2 viral copies this assay can detect is 138 copies/mL. A negative result does not preclude SARS-Cov-2 infection and should not be used as the sole basis for treatment or other patient management decisions. A negative result may occur with  improper specimen collection/handling, submission of specimen other than nasopharyngeal swab, presence of viral mutation(s) within the areas targeted by this assay, and inadequate number of viral copies(<138 copies/mL). A negative result must be combined with clinical observations, patient history, and epidemiological information. The expected result is Negative.  Fact Sheet for Patients:  EntrepreneurPulse.com.au  Fact Sheet for Healthcare Providers:  IncredibleEmployment.be  This test is no t yet approved or cleared by the [place] FDA and  has been authorized for detection and/or diagnosis of SARS-CoV-2 by FDA under an Emergency Use Authorization (EUA). This EUA will remain  in effect (meaning this test can be used) for the duration of the COVID-19 declaration under Section 564(b)(1) of the Act, 21 U.S.C.section 360bbb-3(b)(1), unless the authorization is terminated  or revoked sooner.       Influenza A by PCR NEGATIVE NEGATIVE Final   Influenza B by PCR NEGATIVE NEGATIVE Final    Comment: (NOTE) The Xpert Xpress SARS-CoV-2/FLU/RSV plus assay is intended as an aid in the diagnosis of influenza from Nasopharyngeal swab specimens and should not be used as a sole basis for treatment. Nasal washings and aspirates are unacceptable for Xpert Xpress SARS-CoV-2/FLU/RSV testing.  Fact Sheet for Patients: EntrepreneurPulse.com.au  Fact Sheet for Healthcare  Providers: IncredibleEmployment.be  This test is not yet approved or cleared by the [place] FDA and has been authorized for detection and/or diagnosis of SARS-CoV-2 by FDA under an Emergency Use Authorization (EUA). This EUA will remain in effect (meaning this test can be used) for the duration of the COVID-19 declaration under Section 564(b)(1) of the Act, 21 U.S.C. section 360bbb-3(b)(1), unless the authorization is terminated or revoked.  Performed at [facility], [address] [place], [place] [address]          Radiology Studies: CT ABDOMEN PELVIS W CONTRAST  Result Date: 10/19/2021 CLINICAL DATA:  Diarrhea Abdominal pain, acute, nonlocalized EXAM: CT ABDOMEN AND PELVIS WITH CONTRAST TECHNIQUE: Multidetector CT imaging of the abdomen and pelvis was performed using the standard protocol following bolus administration of intravenous contrast. CONTRAST:  129mL OMNIPAQUE IOHEXOL 300 MG/ML  SOLN COMPARISON:  None. FINDINGS: Lower chest: No acute abnormality. Bilateral breast implants are noted partially. Hepatobiliary: No focal liver abnormality. No gallstones, gallbladder wall thickening, or pericholecystic fluid. No biliary dilatation. Pancreas: No focal lesion. Normal pancreatic contour. No surrounding inflammatory changes. No main pancreatic ductal dilatation. Spleen: Normal in size without focal abnormality. Adrenals/Urinary Tract: No adrenal nodule bilaterally. Bilateral kidneys enhance symmetrically. No hydronephrosis. No hydroureter. The urinary bladder is unremarkable. On delayed imaging, there is no urothelial wall thickening and there are no filling defects in the opacified portions of the bilateral collecting systems or ureters. Stomach/Bowel: Stomach is within normal limits. No evidence of small bowel wall thickening or dilatation. The colon demonstrates bowel wall thickening, mucosal hyperemia, pericolonic fat stranding. Appendix appears  normal. Vascular/Lymphatic: No abdominal aorta or iliac aneurysm. No abdominal, pelvic, or inguinal lymphadenopathy. Reproductive: Complex appearing 7.9 x 5.8 cm left adnexal lesion. The right adnexa/ovary is grossly unremarkable. Status post hysterectomy. Other: Trace simple free fluid ascites. No intraperitoneal free gas. No organized fluid collection. Musculoskeletal: No abdominal wall hernia or abnormality. No suspicious lytic or blastic osseous lesions. No acute displaced fracture. Multilevel degenerative changes of the spine. IMPRESSION: 1. Pancolitis. Differential diagnosis for etiology includes infection or inflammation. No associated bowel perforation or obstruction. 2. Complex appearing 7.9 x 5.8 cm left adnexal lesion. Recommend pelvic ultrasound and gynecologic consultation. 3. Status post hysterectomy. 4. Trace simple free fluid ascites. Electronically Signed   By: Iven Finn M.D.   On: 10/19/2021 23:06        Scheduled Meds:  dicyclomine  10 mg Oral TID AC   fluticasone  2 spray Each Nare Daily   vancomycin  125 mg Oral QID   Continuous Infusions:     LOS: 1 day   The patient is critically ill with multiple organ systems failure and requires high complexity decision making for assessment and support, frequent evaluation and titration of therapies, application of advanced monitoring technologies and extensive interpretation of multiple databases. Critical Care Time devoted to patient care services described in this note  Time spent: 40 minutes     Cherylanne Ardelean, Geraldo Docker, MD [facility]   If 7PM-7AM, please contact night-coverage 10/21/2021, 11:40 AM

## 2021-10-22 DIAGNOSIS — B3731 Acute candidiasis of vulva and vagina: Secondary | ICD-10-CM | POA: Diagnosis present

## 2021-10-22 LAB — COMPREHENSIVE METABOLIC PANEL
ALT: 14 U/L (ref 0–44)
AST: 18 U/L (ref 15–41)
Albumin: 3 g/dL — ABNORMAL LOW (ref 3.5–5.0)
Alkaline Phosphatase: 34 U/L — ABNORMAL LOW (ref 38–126)
Anion gap: 7 (ref 5–15)
BUN: <5 mg/dL — ABNORMAL LOW (ref 6–20)
CO2: 27 mmol/L (ref 22–32)
Calcium: 8.4 mg/dL — ABNORMAL LOW (ref 8.9–10.3)
Chloride: 107 mmol/L (ref 98–111)
Creatinine, Ser: 0.52 mg/dL (ref 0.44–1.00)
GFR, Estimated: >60 mL/min (ref >60)
Glucose, Bld: 92 mg/dL (ref 70–99)
Potassium: 3.9 mmol/L (ref 3.5–5.1)
Sodium: 141 mmol/L (ref 135–145)
Total Bilirubin: 0.5 mg/dL (ref 0.3–1.2)
Total Protein: 5.8 g/dL — ABNORMAL LOW (ref 6.5–8.1)

## 2021-10-22 LAB — CBC WITH DIFFERENTIAL/PLATELET
Abs Immature Granulocytes: 0.01 10*3/uL (ref 0.00–0.07)
Basophils Absolute: 0 10*3/uL (ref 0.0–0.1)
Basophils Relative: 1 %
Eosinophils Absolute: 0.3 10*3/uL (ref 0.0–0.5)
Eosinophils Relative: 6 %
HCT: 33.5 % — ABNORMAL LOW (ref 36.0–46.0)
Hemoglobin: 11.1 g/dL — ABNORMAL LOW (ref 12.0–15.0)
Immature Granulocytes: 0 %
Lymphocytes Relative: 34 %
Lymphs Abs: 1.6 10*3/uL (ref 0.7–4.0)
MCH: 30.2 pg (ref 26.0–34.0)
MCHC: 33.1 g/dL (ref 30.0–36.0)
MCV: 91 fL (ref 80.0–100.0)
Monocytes Absolute: 0.6 10*3/uL (ref 0.1–1.0)
Monocytes Relative: 12 %
Neutro Abs: 2.2 10*3/uL (ref 1.7–7.7)
Neutrophils Relative %: 47 %
Platelets: 241 10*3/uL (ref 150–400)
RBC: 3.68 MIL/uL — ABNORMAL LOW (ref 3.87–5.11)
RDW: 13.9 % (ref 11.5–15.5)
WBC: 4.7 10*3/uL (ref 4.0–10.5)
nRBC: 0 % (ref 0.0–0.2)

## 2021-10-22 LAB — PHOSPHORUS: Phosphorus: 2.8 mg/dL (ref 2.5–4.6)

## 2021-10-22 LAB — MAGNESIUM: Magnesium: 2.1 mg/dL (ref 1.7–2.4)

## 2021-10-22 MED ORDER — MICONAZOLE NITRATE 100 MG VA SUPP
100.0000 mg | Freq: Every day | VAGINAL | Status: DC
  Administered 2021-10-22 – 2021-10-24 (×2): 100 mg via VAGINAL
  Filled 2021-10-22 (×3): qty 7

## 2021-10-22 NOTE — Progress Notes (Signed)
Nurse performed a superficial vaginal exam on pt as she was complaining of yeast infection with burning and itching.  Nurse noted redness but no discharge at this time.  Dr. Sherral Hammers made aware and will order topical cream to treat the area.

## 2021-10-22 NOTE — Progress Notes (Signed)
PROGRESS NOTE    Jacqueline Hughes  NWG:[id number] DOB: 04-03-1966 DOA: 10/19/2021 PCP: Donella Stade, PA-C   Brief Narrative:  [name] PMHx Acquired hypothyroidism, allergic rhinitis, GAD   Admitted to [facility] on 10/19/2021 by way of transfer from [place] ED with sepsis due to C. difficile colitis after presenting from home to the latter facility complaining of diarrhea.   T patient reports 2 weeks of loose stool, that is becoming progressively more frequent over that timeframe.  Over the last 3 to 4 days, this increase in frequency has resulted in at least 10-15 episodes per day of loose stool in the absence of any melena or hematochezia.  She notes diffuse crampy abdominal discomfort that worsens with palpation over the abdomen.  She also reports intermittent nausea in the absence of any vomiting.  For the last 1 to 2 days she has also developed an objective fever, noting temperature max of 102 at home earlier today.  Denies any associated chills, full body rigors, or generalized myalgias.  Denies any history of known inflammatory bowel disease, including no history of Crohn's or ulcerative colitis.  She also denies any known history of autoimmune related enterocolitis, including no history of celiac's disease.   Denies any recent traveling, but reports that she completed a course of Augmentin approximately 1 month ago as treatment for acute sinusitis.  Denies any use of proton pump inhibitors at home.   Denies any recent chest pain, shortness of breath, palpitations, diaphoresis.  Not associate with any neck stiffness, rash, dysuria, gross hematuria   Imaging and additional notable ED work-up: CT abdomen/pelvis with contrast showed pancolitis, felt to be consistent with infectious versus inflammatory colitis, in the absence of any evidence of perforation, obstruction, or abscess.   While in the ED, the following were administered: Bentyl 20 mg IM x1, Zofran 4 mg IV  x1, and p.o. vancomycin.  Subsequently, the patient was admitted to [place] for observation for further evaluation management of sepsis due to C. difficile colitis.  Subjective: 12/23 afebrile overnight A/O x4, complaining of discomfort in the vaginal area.  Will not let me examine her however will allow RN to examine her.  Feels its consistent with yeast infection.    Assessment & Plan:  Covid vaccination;  Principal Problem:   C. difficile colitis Active Problems:   Diarrhea   Pancolitis ([place])   Sepsis ([place])   Leukocytosis   Acute hyponatremia   Thyroid disease   Vaginal candidiasis      C. difficile colitis -On admission met criteria for sepsis WBC> 12K, HR> 90, temp > 38 C site of infection GI tract  - CT abdomen/pelvis showing evidence of pancolitis, consistent with infectious process. C. difficile antigen/toxin positive findings earlier this evening.   -This is in the context of completion of course of Augmentin 1 month ago, increasing risk for ensuing development of pseudomembranous colitis.   -Patient's first episode of C. difficile colitis -Continue vancomycin p.o. 125 mg QID x10 days - 12/22 Lactobacillus 2 tablet TID   Sepsis - See C. difficile colitis   Vaginal Candidiasis -Miconazole vaginal suppository 100 mg x 7 days  Acute hypoosmolar hypovolemic hyponatremia -Strict in and out +6.2 L - Daily weight Filed Weights   10/19/21 1844  Weight: 67.1 kg  -Resolved  Acquired Hypothyroidism - 12/21 TSH= 2.5 -Patient's home meds do not reflect patient being on Synthroid. -12/23 Free T4 pending    Hypokalemia -Potassium goal> 4 -  12/21 potassium IV 60 mEq   Hypomagnesmia - Magnesium goal> 2 - 12/21 magnesium IV 3 g    DVT prophylaxis: SCD Code Status: Full Family Communication:  Status is: Inpatient    Dispo: The patient is from: Home              Anticipated d/c is to: Home              Anticipated d/c date is: 3 days              Patient  currently is not medically stable to d/c.      Consultants:     Procedures/Significant Events:     I have personally reviewed and interpreted all radiology studies and my findings are as above.  VENTILATOR SETTINGS:    Cultures   Antimicrobials: Anti-infectives (From admission, onward)    Start     Ordered Stop   10/20/21 1000  vancomycin (VANCOCIN) capsule 125 mg        10/20/21 0031 10/30/21 0959         Devices    LINES / TUBES:      Continuous Infusions:     Objective: Vitals:   10/21/21 1502 10/21/21 2132 10/22/21 0528 10/22/21 1505  BP: 94/70 108/72 100/67 99/71  Pulse: 74 78 80 97  Resp: '14  18 14  ' Temp: 98.2 F (36.8 C) 98.2 F (36.8 C) 97.9 F (36.6 C) 98.3 F (36.8 C)  TempSrc: Oral Oral Oral Oral  SpO2: 98% 99% 97% 99%  Weight:      Height:        Intake/Output Summary (Last 24 hours) at 10/22/2021 2133 Last data filed at 10/22/2021 1700 Gross per 24 hour  Intake 1080 ml  Output 0 ml  Net 1080 ml   Filed Weights   10/19/21 1844  Weight: 67.1 kg    Examination:  General: A/O x4 No acute respiratory distress Eyes: negative scleral hemorrhage, negative anisocoria, negative icterus ENT: Negative Runny nose, negative gingival bleeding, Neck:  Negative scars, masses, torticollis, lymphadenopathy, JVD Lungs: Clear to auscultation bilaterally without wheezes or crackles Cardiovascular: Regular rate and rhythm without murmur gallop or rub normal S1 and S2 Abdomen: Positive abdominal pain, negative nondistended, positive soft, bowel sounds, no rebound, no ascites, no appreciable mass Extremities: No significant cyanosis, clubbing, or edema bilateral lower extremities Skin: Negative rashes, lesions, ulcers Psychiatric:  Negative depression, negative anxiety, negative fatigue, negative mania  Central nervous system:  Cranial nerves II through XII intact, tongue/uvula midline, all extremities muscle strength 5/5, sensation intact  throughout, negative dysarthria, negative expressive aphasia, negative receptive aphasia.  .     Data Reviewed: Care during the described time interval was provided by me .  I have reviewed this patient's available data, including medical history, events of note, physical examination, and all test results as part of my evaluation.   CBC: Recent Labs  Lab 10/18/21 0000 10/19/21 2112 10/20/21 0446 10/22/21 0505  WBC 12.2* 12.4* 7.7 4.7  NEUTROABS 10,882* 10.6* 6.3 2.2  HGB 13.5 13.0 11.6* 11.1*  HCT 39.8 39.2 35.1* 33.5*  MCV 89.6 90.5 91.2 91.0  PLT 310 247 192 520   Basic Metabolic Panel: Recent Labs  Lab 10/18/21 0000 10/19/21 2112 10/20/21 0446 10/22/21 0505  NA 136 129* 135 141  K 4.1 3.5 3.2* 3.9  CL 99 95* 101 107  CO2 '26 27 27 27  ' GLUCOSE 85 127* 103* 92  BUN '9 12 8 ' <5*  CREATININE  0.74 0.75 0.73 0.52  CALCIUM 8.7 8.2* 8.0* 8.4*  MG  --  1.7 1.6* 2.1  PHOS  --   --   --  2.8   GFR: Estimated Creatinine Clearance: 81.1 mL/min (by C-G formula based on SCr of 0.52 mg/dL). Liver Function Tests: Recent Labs  Lab 10/18/21 0000 10/19/21 2112 10/20/21 0446 10/22/21 0505  AST '18 19 19 18  ' ALT '12 14 13 14  ' ALKPHOS  --  44 37* 34*  BILITOT 0.5 0.6 0.5 0.5  PROT 7.1 6.7 5.6* 5.8*  ALBUMIN  --  3.4* 3.0* 3.0*   Recent Labs  Lab 10/19/21 2112  LIPASE 22   No results for input(s): AMMONIA in the last 168 hours. Coagulation Profile: No results for input(s): INR, PROTIME in the last 168 hours. Cardiac Enzymes: No results for input(s): CKTOTAL, CKMB, CKMBINDEX, TROPONINI in the last 168 hours. BNP (last 3 results) No results for input(s): PROBNP in the last 8760 hours. HbA1C: No results for input(s): HGBA1C in the last 72 hours. CBG: No results for input(s): GLUCAP in the last 168 hours. Lipid Profile: No results for input(s): CHOL, HDL, LDLCALC, TRIG, CHOLHDL, LDLDIRECT in the last 72 hours. Thyroid Function Tests: Recent Labs    10/20/21 0446  TSH  2.586   Anemia Panel: No results for input(s): VITAMINB12, FOLATE, FERRITIN, TIBC, IRON, RETICCTPCT in the last 72 hours. Urine analysis:    Component Value Date/Time   COLORURINE STRAW (A) 10/20/2021 0618   APPEARANCEUR CLEAR 10/20/2021 0618   LABSPEC 1.002 (L) 10/20/2021 0618   PHURINE 6.0 10/20/2021 0618   GLUCOSEU NEGATIVE 10/20/2021 0618   HGBUR MODERATE (A) 10/20/2021 0618   BILIRUBINUR NEGATIVE 10/20/2021 0618   KETONESUR NEGATIVE 10/20/2021 0618   PROTEINUR NEGATIVE 10/20/2021 0618   NITRITE NEGATIVE 10/20/2021 0618   LEUKOCYTESUR LARGE (A) 10/20/2021 0618   Sepsis Labs: '@[email or web address]' (procalcitonin:4,lacticidven:4)  ) Recent Results (from the past 240 hour(s))  C Difficile Quick Screen w PCR reflex     Status: Abnormal   Collection Time: 10/19/21  8:50 PM   Specimen: STOOL  Result Value Ref Range Status   C Diff antigen POSITIVE (A) NEGATIVE Final    Comment: CRITICAL RESULT CALLED TO, READ BACK BY AND VERIFIED WITH:  KELLY NEAL RN 10/20/2021 '@0028'  BY JW    C Diff toxin POSITIVE (A) NEGATIVE Final   C Diff interpretation Toxin producing C. difficile detected.  Final    Comment: Performed at [facility], [place] [address], [place], [place] [address]  Gastrointestinal Panel by PCR , Stool     Status: None   Collection Time: 10/19/21  8:50 PM   Specimen: STOOL  Result Value Ref Range Status   Campylobacter species NOT DETECTED NOT DETECTED Final   Plesimonas shigelloides NOT DETECTED NOT DETECTED Final   Salmonella species NOT DETECTED NOT DETECTED Final   Yersinia enterocolitica NOT DETECTED NOT DETECTED Final   Vibrio species NOT DETECTED NOT DETECTED Final   Vibrio cholerae NOT DETECTED NOT DETECTED Final   Enteroaggregative E coli (EAEC) NOT DETECTED NOT DETECTED Final   Enteropathogenic E coli (EPEC) NOT DETECTED NOT DETECTED Final   Enterotoxigenic E coli (ETEC) NOT DETECTED NOT DETECTED Final   Shiga like toxin producing E coli (STEC) NOT DETECTED NOT  DETECTED Final   Shigella/Enteroinvasive E coli (EIEC) NOT DETECTED NOT DETECTED Final   Cryptosporidium NOT DETECTED NOT DETECTED Final   Cyclospora cayetanensis NOT DETECTED NOT DETECTED Final   Entamoeba histolytica NOT DETECTED  NOT DETECTED Final   Giardia lamblia NOT DETECTED NOT DETECTED Final   Adenovirus F40/41 NOT DETECTED NOT DETECTED Final   Astrovirus NOT DETECTED NOT DETECTED Final   Norovirus GI/GII NOT DETECTED NOT DETECTED Final   Rotavirus A NOT DETECTED NOT DETECTED Final   Sapovirus (I, II, IV, and V) NOT DETECTED NOT DETECTED Final    Comment: Performed at [facility], [address] [place], [place] [address]  Culture, blood (routine x 2)     Status: None (Preliminary result)   Collection Time: 10/20/21 12:10 AM   Specimen: BLOOD  Result Value Ref Range Status   Specimen Description   Final    BLOOD LEFT ANTECUBITAL Performed at [facility], [address] [place], [place] [address]    Special Requests   Final    BOTTLES DRAWN AEROBIC AND ANAEROBIC Blood Culture adequate volume Performed at [facility], [address] [place], [place] [address]    Culture   Final    NO GROWTH 2 DAYS Performed at [facility], [place] [address], [place], [place] [address]    Report Status PENDING  Incomplete  Culture, blood (routine x 2)     Status: None (Preliminary result)   Collection Time: 10/20/21 12:16 AM   Specimen: BLOOD  Result Value Ref Range Status   Specimen Description   Final    BLOOD RIGHT ANTECUBITAL Performed at [facility], [address] [place], [place] [address]    Special Requests   Final    BOTTLES DRAWN AEROBIC AND ANAEROBIC Blood Culture results may not be optimal due to an excessive volume of blood received in culture bottles Performed at [facility], [address] [place], [place] [address]    Culture   Final    NO GROWTH 2 DAYS Performed at [facility], [place] [address], [place], [place] [address]    Report Status PENDING  Incomplete  Resp Panel by RT-PCR (Flu A&B, Covid)     Status: None   Collection Time: 10/20/21 12:16 AM   Specimen: Nasopharyngeal(NP) swabs in vial transport medium  Result Value Ref Range Status   SARS Coronavirus 2 by RT PCR NEGATIVE NEGATIVE Final    Comment: (NOTE) SARS-CoV-2 target nucleic acids are NOT DETECTED.  The SARS-CoV-2 RNA is generally detectable in upper respiratory specimens during the acute phase of infection. The lowest concentration of SARS-CoV-2 viral copies this assay can detect is 138 copies/mL. A negative result does not preclude SARS-Cov-2 infection and should not be used as the sole basis for treatment or other patient management decisions. A negative result may occur with  improper specimen collection/handling, submission of specimen other than nasopharyngeal swab, presence of viral mutation(s) within the areas targeted by this assay, and inadequate number of viral copies(<138 copies/mL). A negative result must be combined with clinical observations, patient history, and epidemiological information. The expected result is Negative.  Fact Sheet for Patients:  EntrepreneurPulse.com.au  Fact Sheet for Healthcare Providers:  IncredibleEmployment.be  This test is no t yet approved or cleared by the [place] FDA and  has been authorized for detection and/or diagnosis of SARS-CoV-2 by FDA under an Emergency Use Authorization (EUA). This EUA will remain  in effect (meaning this test can be used) for the duration of the COVID-19 declaration under Section 564(b)(1) of the Act, 21 U.S.C.section 360bbb-3(b)(1), unless the authorization is terminated  or revoked sooner.  Influenza A by PCR NEGATIVE NEGATIVE Final   Influenza B by PCR NEGATIVE NEGATIVE Final    Comment: (NOTE) The Xpert Xpress SARS-CoV-2/FLU/RSV plus assay is intended as an aid in the diagnosis of  influenza from Nasopharyngeal swab specimens and should not be used as a sole basis for treatment. Nasal washings and aspirates are unacceptable for Xpert Xpress SARS-CoV-2/FLU/RSV testing.  Fact Sheet for Patients: EntrepreneurPulse.com.au  Fact Sheet for Healthcare Providers: IncredibleEmployment.be  This test is not yet approved or cleared by the [place] FDA and has been authorized for detection and/or diagnosis of SARS-CoV-2 by FDA under an Emergency Use Authorization (EUA). This EUA will remain in effect (meaning this test can be used) for the duration of the COVID-19 declaration under Section 564(b)(1) of the Act, 21 U.S.C. section 360bbb-3(b)(1), unless the authorization is terminated or revoked.  Performed at [facility], [address] [place], [place] [address]          Radiology Studies: No results found.      Scheduled Meds:  acidophilus  2 capsule Oral TID   dicyclomine  10 mg Oral TID AC   fluticasone  2 spray Each Nare Daily   miconazole  100 mg Vaginal QHS   nystatin  5 mL Oral QID   vancomycin  125 mg Oral QID   Continuous Infusions:     LOS: 2 days   The patient is critically ill with multiple organ systems failure and requires high complexity decision making for assessment and support, frequent evaluation and titration of therapies, application of advanced monitoring technologies and extensive interpretation of multiple databases. Critical Care Time devoted to patient care services described in this note  Time spent: 40 minutes     Tasmia Blumer, Geraldo Docker, MD [facility]   If 7PM-7AM, please contact night-coverage 10/22/2021, 9:33 PM

## 2021-10-23 LAB — CBC WITH DIFFERENTIAL/PLATELET
Abs Immature Granulocytes: 0.02 10*3/uL (ref 0.00–0.07)
Basophils Absolute: 0 10*3/uL (ref 0.0–0.1)
Basophils Relative: 1 %
Eosinophils Absolute: 0.3 10*3/uL (ref 0.0–0.5)
Eosinophils Relative: 6 %
HCT: 36.7 % (ref 36.0–46.0)
Hemoglobin: 12.1 g/dL (ref 12.0–15.0)
Immature Granulocytes: 0 %
Lymphocytes Relative: 34 %
Lymphs Abs: 1.8 10*3/uL (ref 0.7–4.0)
MCH: 29.7 pg (ref 26.0–34.0)
MCHC: 33 g/dL (ref 30.0–36.0)
MCV: 90.2 fL (ref 80.0–100.0)
Monocytes Absolute: 0.7 10*3/uL (ref 0.1–1.0)
Monocytes Relative: 14 %
Neutro Abs: 2.3 10*3/uL (ref 1.7–7.7)
Neutrophils Relative %: 45 %
Platelets: 295 10*3/uL (ref 150–400)
RBC: 4.07 MIL/uL (ref 3.87–5.11)
RDW: 13.7 % (ref 11.5–15.5)
WBC: 5.1 10*3/uL (ref 4.0–10.5)
nRBC: 0 % (ref 0.0–0.2)

## 2021-10-23 LAB — COMPREHENSIVE METABOLIC PANEL
ALT: 14 U/L (ref 0–44)
AST: 18 U/L (ref 15–41)
Albumin: 3.3 g/dL — ABNORMAL LOW (ref 3.5–5.0)
Alkaline Phosphatase: 43 U/L (ref 38–126)
Anion gap: 8 (ref 5–15)
BUN: <5 mg/dL — ABNORMAL LOW (ref 6–20)
CO2: 27 mmol/L (ref 22–32)
Calcium: 8.5 mg/dL — ABNORMAL LOW (ref 8.9–10.3)
Chloride: 106 mmol/L (ref 98–111)
Creatinine, Ser: 0.59 mg/dL (ref 0.44–1.00)
GFR, Estimated: >60 mL/min (ref >60)
Glucose, Bld: 97 mg/dL (ref 70–99)
Potassium: 4 mmol/L (ref 3.5–5.1)
Sodium: 141 mmol/L (ref 135–145)
Total Bilirubin: 0.4 mg/dL (ref 0.3–1.2)
Total Protein: 6.3 g/dL — ABNORMAL LOW (ref 6.5–8.1)

## 2021-10-23 LAB — MAGNESIUM: Magnesium: 2 mg/dL (ref 1.7–2.4)

## 2021-10-23 LAB — URINALYSIS, ROUTINE W REFLEX MICROSCOPIC
Bacteria, UA: NONE SEEN
Bilirubin Urine: NEGATIVE
Glucose, UA: NEGATIVE mg/dL
Hgb urine dipstick: NEGATIVE
Ketones, ur: NEGATIVE mg/dL
Leukocytes,Ua: NEGATIVE
Nitrite: NEGATIVE
Protein, ur: 30 mg/dL — AB
Specific Gravity, Urine: 1.02 (ref 1.005–1.030)
pH: 6 (ref 5.0–8.0)

## 2021-10-23 LAB — T4, FREE: Free T4: 0.96 ng/dL (ref 0.61–1.12)

## 2021-10-23 LAB — PHOSPHORUS: Phosphorus: 4 mg/dL (ref 2.5–4.6)

## 2021-10-23 MED ORDER — SODIUM CHLORIDE 0.9 % IV SOLN: INTRAVENOUS | Status: DC

## 2021-10-23 MED ORDER — SODIUM CHLORIDE 0.9 % IV BOLUS
500.0000 mL | Freq: Once | INTRAVENOUS | Status: AC
  Administered 2021-10-23: 09:00:00 500 mL via INTRAVENOUS

## 2021-10-23 NOTE — Plan of Care (Signed)

## 2021-10-23 NOTE — Progress Notes (Signed)
PROGRESS NOTE    Jacqueline Hughes  AST:[id number] DOB: [birth year] DOA: 10/19/2021 PCP: Donella Stade, PA-C   Brief Narrative: The patient is a 55 year old Caucasian female with a past medical history significant for but not limited to acquired hypothyroidism, allergic rhinitis, generalized anxiety disorder as well as other comorbidities who presented to the hospital on 10/19/2021 by way of transfer from [facility] ED with sepsis due to C. difficile colitis after he presented from home to the facility complaining of significant diarrhea.  She reports 2 weeks of loose stool that progressively became more frequent over the timeframe the last 3 to 4 days she has had increased frequency resulting in at least 10-15 episodes of loose stool in the absence of any melena or hematochezia.  She does note diffuse crampy abdominal discomfort that worsens with palpation of the abdomen and reports intermittent nausea but no vomiting.  Last 1 to 2 days she is developed a fever with a T-max of 102.  She denies any chills he does not know of any history of inflammatory bowel disease he does not have any history of Crohn's or ulcerative colitis.  She also denies any history of autoimmune related enterocolitis and no history of celiac's disease.  She recently reports that she completed a course of Augmentin approximately a month ago for treatment of acute sinusitis and denies any PPIs.  She came to the ED for these complaints and CT of the abdomen pelvis was done and showed pancolitis that was felt to be consistent with infectious versus inflammatory colitis but no evidence of perforation, obstruction or abscess.  While in the ED she was given Bentyl, Zofran and p.o. vancomycin and admitted to [facility] for the management of sepsis due to C. difficile colitis.  She is now currently still having quite a bit of bowel movements and complaining of some burning and discomfort" lesions in her vaginal area."   Assessment &  Plan:   Principal Problem:   C. difficile colitis Active Problems:   Diarrhea   Pancolitis ([place])   Sepsis ([place])   Leukocytosis   Acute hyponatremia   Thyroid disease   Vaginal candidiasis  Sepsis secondary to C. difficile colitis -He met sepsis criteria on admission she had WBCs greater than 12,000, heart rate greater than 90, and a temperature greater than 38 C with a source of infection from C. Difficile -CT of the abdomen pelvis done and showed pancolitis consistent with infectious process -C. difficile antigen toxin was positive -She recently takes Augmentin which increases risk for development of pseudomembranous colitis -We will continue supportive care and patient was given 500 mL bolus and will start back on maintenance IV fluids at 75 MLS per hour given the amount of diarrhea she is having -Continue with vancomycin p.o. 125 mg p.o. 4 times daily for 10 days -Blood cultures x2 showed no growth to date at 3 days -Last lactic acid level was checked and was 0.8 -Initiated on probiotics with acidophilus 2 capsules p.o. 3 times daily -Continue with dicyclomine 10 mg p.o. 3 times daily before meals -Sepsis physiology is improved and she is no longer febrile and no longer has a WBC but is a little hypotensive so we will give a 500 mL bolus  Normocytic Anemia -Improved and patient's hemoglobin/hematocrit went from 11.1/33.5 is now 12.1/36.7 -Check anemia panel in the a.m. -Continue to monitor for signs and symptoms of bleeding; currently no overt bleeding noted -Repeat CBC in a.m.  Suspected vaginal candidiasis -  She is initiated on miconazole vaginal suppository 100 mg x 7 days -Given that she continues to have burning and discomfort and "lesions" we will check a urinalysis -May need some Diflucan but will evaluate her burning discomfort in her urine first -Initial urinalysis showed a small color urine with moderate hemoglobin, large leukocytes, negative nitrites, rare bacteria,  0-5 RBCs per high-power field, 0-5 squamous epithelial cells, 11-20 WBCs and urine cultures not done we will order repeat urinalysis and urine culture today  Hyponatremia -Improved -Sodium went from 129 trended up to 141 -Resumed normal saline at 75 MLS per hour and given 500 mL bolus -Continue to monitor and trend and repeat CMP in a.m.  Acquired Hypothyroidism -TSH was checked and was 2.586 -Home medications do not reflect the patient being on Synthroid -Free T4 0.96  Hypokalemia -Patient's K+ went from 3.2 is now 4.0 -Continue monitor and replete as necessary -Repeat CMP in the AM   Hypomagnesemia -Improved.  Patient's mag level from 1.6 and improved to 2.0 -Continue to monitor and replete as necessary -Repeat CMP in a.m.  DVT prophylaxis: SCDs but will initiate DVT prophylaxis pharmacologically Code Status: FULL CODE  Family Communication: No family currently at bedside Disposition Plan: Pending further clinical improvement in her diarrhea  Status is: Inpatient  Remains inpatient appropriate because: Patient continues to have significant amount of diarrhea and has at least 10-15 bowel movements currently they are all loose and watery  Consultants:  None  Procedures:  None  Antimicrobials:  Anti-infectives (From admission, onward)    Start     Dose/Rate Route Frequency Ordered Stop   10/20/21 1000  vancomycin (VANCOCIN) capsule 125 mg        125 mg Oral 4 times daily 10/20/21 0031 10/30/21 0959        Subjective: In and examined at bedside and was still having quite a bit of diarrhea and had at least 15-20 bowel movements since 1 AM this morning.  Complains of having some blood in her diarrhea as well which is slight.  No chest pain or shortness of breath.  Continues have some crampy abdominal pain.  No other concerns or complaints at this time.  Objective: Vitals:   10/22/21 0528 10/22/21 1505 10/22/21 2139 10/23/21 0539  BP: 1_0 (!) 88/59   Pulse: 80 97 88 83  Resp: _1 Temp: 97.9 F (36.6 C) 98.3 F (36.8 C) 98.4 F (36.9 C) 98.5 F (36.9 C)  TempSrc: Oral Oral Oral Oral  SpO2: 97% 99% 96% 97%  Weight:      Height:        Intake/Output Summary (Last 24 hours) at 10/23/2021 1314 Last data filed at 10/23/2021 1000 Gross per 24 hour  Intake 1680 ml  Output 0 ml  Net 1680 ml   Filed Weights   10/19/21 1844  Weight: 67.1 kg   Examination: Physical Exam:  Constitutional: WN/WD thin Caucasian female currently in no acute distress appears calm but a little uncomfortable Eyes: PERRL, lids and conjunctivae normal, sclerae anicteric  ENMT: External Ears, Nose appear normal. Grossly normal hearing. Mucous membranes are moist. Posterior pharynx clear of any exudate or lesions. Normal dentition.  Neck: Appears normal, supple, no cervical masses, normal ROM, no appreciable thyromegaly Respiratory: Diminished to auscultation bilaterally, no wheezing, rales, rhonchi or crackles. Normal respiratory effort and patient is not tachypenic. No accessory muscle use.  Cardiovascular: RRR, no murmurs / rubs / gallops. S1 and S2 auscultated.  No  appreciable lower extremity edema Abdomen: Soft, tender to palpate non-tender, mildly distended. Bowel sounds positive.  GU: Deferred. Musculoskeletal: No clubbing / cyanosis of digits/nails. No joint deformity upper and lower extremities.  Skin: No rashes, lesions, ulcers on limited skin evaluation. No induration; Warm and dry.  Neurologic: CN 2-12 grossly intact with no focal deficits.  Romberg sign and cerebellar reflexes not assessed.  Psychiatric: Normal judgment and insight. Alert and oriented x 3. Normal mood and appropriate affect.   Data Reviewed: I have personally reviewed following labs and imaging studies  CBC: Recent Labs  Lab 10/18/21 0000 10/19/21 2112 10/20/21 0446 10/22/21 0505 10/23/21 0435  WBC 12.2* 12.4* 7.7 4.7 5.1  NEUTROABS 10,882* 10.6* 6.3 2.2 2.3   HGB 13.5 13.0 11.6* 11.1* 12.1  HCT 39.8 39.2 35.1* 33.5* 36.7  MCV 89.6 90.5 91.2 91.0 90.2  PLT 310 247 192 241 629   Basic Metabolic Panel: Recent Labs  Lab 10/18/21 0000 10/19/21 2112 10/20/21 0446 10/22/21 0505 10/23/21 0435  NA 136 129* 135 141 141  K 4.1 3.5 3.2* 3.9 4.0  CL 99 95* 101 107 106  CO2 _0 GLUCOSE 85 127* 103* 92 97  BUN _1 <5* <5*  CREATININE 0.74 0.75 0.73 0.52 0.59  CALCIUM 8.7 8.2* 8.0* 8.4* 8.5*  MG  --  1.7 1.6* 2.1 2.0  PHOS  --   --   --  2.8 4.0   GFR: Estimated Creatinine Clearance: 81.1 mL/min (by C-G formula based on SCr of 0.59 mg/dL). Liver Function Tests: Recent Labs  Lab 10/18/21 0000 10/19/21 2112 10/20/21 0446 10/22/21 0505 10/23/21 0435  AST _2 ALT _3 ALKPHOS  --  44 37* 34* 43  BILITOT 0.5 0.6 0.5 0.5 0.4  PROT 7.1 6.7 5.6* 5.8* 6.3*  ALBUMIN  --  3.4* 3.0* 3.0* 3.3*   Recent Labs  Lab 10/19/21 2112  LIPASE 22   No results for input(s): AMMONIA in the last 168 hours. Coagulation Profile: No results for input(s): INR, PROTIME in the last 168 hours. Cardiac Enzymes: No results for input(s): CKTOTAL, CKMB, CKMBINDEX, TROPONINI in the last 168 hours. BNP (last 3 results) No results for input(s): PROBNP in the last 8760 hours. HbA1C: No results for input(s): HGBA1C in the last 72 hours. CBG: No results for input(s): GLUCAP in the last 168 hours. Lipid Profile: No results for input(s): CHOL, HDL, LDLCALC, TRIG, CHOLHDL, LDLDIRECT in the last 72 hours. Thyroid Function Tests: Recent Labs    10/23/21 0435  FREET4 0.96   Anemia Panel: No results for input(s): VITAMINB12, FOLATE, FERRITIN, TIBC, IRON, RETICCTPCT in the last 72 hours. Sepsis Labs: Recent Labs  Lab 10/20/21 0446  LATICACIDVEN 0.8    Recent Results (from the past 240 hour(s))  C Difficile Quick Screen w PCR reflex     Status: Abnormal   Collection Time: 10/19/21  8:50 PM   Specimen: STOOL  Result Value  Ref Range Status   C Diff antigen POSITIVE (A) NEGATIVE Final    Comment: CRITICAL RESULT CALLED TO, READ BACK BY AND VERIFIED WITH:  KELLY NEAL RN 10/20/2021 _4  BY JW    C Diff toxin POSITIVE (A) NEGATIVE Final   C Diff interpretation Toxin producing C. difficile detected.  Final    Comment: Performed at [facility], [place] [address] [place], [place] [address]  Gastrointestinal Panel by PCR , Stool  Status: None   Collection Time: 10/19/21  8:50 PM   Specimen: STOOL  Result Value Ref Range Status   Campylobacter species NOT DETECTED NOT DETECTED Final   Plesimonas shigelloides NOT DETECTED NOT DETECTED Final   Salmonella species NOT DETECTED NOT DETECTED Final   Yersinia enterocolitica NOT DETECTED NOT DETECTED Final   Vibrio species NOT DETECTED NOT DETECTED Final   Vibrio cholerae NOT DETECTED NOT DETECTED Final   Enteroaggregative E coli (EAEC) NOT DETECTED NOT DETECTED Final   Enteropathogenic E coli (EPEC) NOT DETECTED NOT DETECTED Final   Enterotoxigenic E coli (ETEC) NOT DETECTED NOT DETECTED Final   Shiga like toxin producing E coli (STEC) NOT DETECTED NOT DETECTED Final   Shigella/Enteroinvasive E coli (EIEC) NOT DETECTED NOT DETECTED Final   Cryptosporidium NOT DETECTED NOT DETECTED Final   Cyclospora cayetanensis NOT DETECTED NOT DETECTED Final   Entamoeba histolytica NOT DETECTED NOT DETECTED Final   Giardia lamblia NOT DETECTED NOT DETECTED Final   Adenovirus F40/41 NOT DETECTED NOT DETECTED Final   Astrovirus NOT DETECTED NOT DETECTED Final   Norovirus GI/GII NOT DETECTED NOT DETECTED Final   Rotavirus A NOT DETECTED NOT DETECTED Final   Sapovirus (I, II, IV, and V) NOT DETECTED NOT DETECTED Final    Comment: Performed at [facility], [address] [place], [place] [address]  Culture, blood (routine x 2)     Status: None (Preliminary result)   Collection Time: 10/20/21 12:10 AM   Specimen: BLOOD  Result Value Ref Range Status   Specimen  Description   Final    BLOOD LEFT ANTECUBITAL Performed at [facility], [address] [place], [place] [address]    Special Requests   Final    BOTTLES DRAWN AEROBIC AND ANAEROBIC Blood Culture adequate volume Performed at [facility], [address] [place], [place] [address]    Culture   Final    NO GROWTH 3 DAYS Performed at [facility], [address], [place], [place] [address]    Report Status PENDING  Incomplete  Culture, blood (routine x 2)     Status: None (Preliminary result)   Collection Time: 10/20/21 12:16 AM   Specimen: BLOOD  Result Value Ref Range Status   Specimen Description   Final    BLOOD RIGHT ANTECUBITAL Performed at [facility], [address] [place], [place] [address]    Special Requests   Final    BOTTLES DRAWN AEROBIC AND ANAEROBIC Blood Culture results may not be optimal due to an excessive volume of blood received in culture bottles Performed at [facility], [address] [place], [place] [address]    Culture   Final    NO GROWTH 3 DAYS Performed at [facility], [place] [address], [place], [place] [address]    Report Status PENDING  Incomplete  Resp Panel by RT-PCR (Flu A&B, Covid)     Status: None   Collection Time: 10/20/21 12:16 AM   Specimen: Nasopharyngeal(NP) swabs in vial transport medium  Result Value Ref Range Status   SARS Coronavirus 2 by RT PCR NEGATIVE NEGATIVE Final    Comment: (NOTE) SARS-CoV-2 target nucleic acids are NOT DETECTED.  The SARS-CoV-2 RNA is generally detectable in upper respiratory specimens during the acute phase of infection. The lowest concentration of SARS-CoV-2 viral copies this assay can detect is 138 copies/mL. A negative result does not preclude SARS-Cov-2 infection and should  not be used as the sole basis for treatment or other patient management decisions. A negative result may occur with  improper specimen collection/handling, submission  of specimen other than nasopharyngeal swab, presence of viral mutation(s) within the areas targeted by this assay, and inadequate number of viral copies(<138 copies/mL). A negative result must be combined with clinical observations, patient history, and epidemiological information. The expected result is Negative.  Fact Sheet for Patients:  EntrepreneurPulse.com.au  Fact Sheet for Healthcare Providers:  IncredibleEmployment.be  This test is no t yet approved or cleared by the [place] FDA and  has been authorized for detection and/or diagnosis of SARS-CoV-2 by FDA under an Emergency Use Authorization (EUA). This EUA will remain  in effect (meaning this test can be used) for the duration of the COVID-19 declaration under Section 564(b)(1) of the Act, 21 U.S.C.section 360bbb-3(b)(1), unless the authorization is terminated  or revoked sooner.       Influenza A by PCR NEGATIVE NEGATIVE Final   Influenza B by PCR NEGATIVE NEGATIVE Final    Comment: (NOTE) The Xpert Xpress SARS-CoV-2/FLU/RSV plus assay is intended as an aid in the diagnosis of influenza from Nasopharyngeal swab specimens and should not be used as a sole basis for treatment. Nasal washings and aspirates are unacceptable for Xpert Xpress SARS-CoV-2/FLU/RSV testing.  Fact Sheet for Patients: EntrepreneurPulse.com.au  Fact Sheet for Healthcare Providers: IncredibleEmployment.be  This test is not yet approved or cleared by the [place] FDA and has been authorized for detection and/or diagnosis of SARS-CoV-2 by FDA under an Emergency Use Authorization (EUA). This EUA will remain in effect (meaning this test can be used) for the duration of the COVID-19 declaration under Section 564(b)(1) of the Act, 21 U.S.C. section 360bbb-3(b)(1), unless the authorization is terminated or revoked.  Performed at [facility], [address] [place], [place] [address]     RN Pressure Injury Documentation:     Estimated body mass index is 22.5 kg/m as calculated from the following:   Height as of this encounter: _0  (1.727 m).   Weight as of this encounter: 67.1 kg.  Malnutrition Type:   Malnutrition Characteristics:   Nutrition Interventions:     Radiology Studies: No results found.  Scheduled Meds:  acidophilus  2 capsule Oral TID   dicyclomine  10 mg Oral TID AC   fluticasone  2 spray Each Nare Daily   miconazole  100 mg Vaginal QHS   nystatin  5 mL Oral QID   vancomycin  125 mg Oral QID   Continuous Infusions:  sodium chloride 75 mL/hr at 10/23/21 1236    LOS: 3 days   Kerney Elbe, DO [facility] PAGER is on AMION  If 7PM-7AM, please contact night-coverage [email or web address]

## 2021-10-24 ENCOUNTER — Inpatient Hospital Stay (HOSPITAL_COMMUNITY): Payer: No Typology Code available for payment source

## 2021-10-24 DIAGNOSIS — N949 Unspecified condition associated with female genital organs and menstrual cycle: Secondary | ICD-10-CM

## 2021-10-24 DIAGNOSIS — N898 Other specified noninflammatory disorders of vagina: Secondary | ICD-10-CM

## 2021-10-24 LAB — COMPREHENSIVE METABOLIC PANEL
ALT: 14 U/L (ref 0–44)
AST: 15 U/L (ref 15–41)
Albumin: 2.8 g/dL — ABNORMAL LOW (ref 3.5–5.0)
Alkaline Phosphatase: 43 U/L (ref 38–126)
Anion gap: 5 (ref 5–15)
BUN: 16 mg/dL (ref 6–20)
CO2: 27 mmol/L (ref 22–32)
Calcium: 8.1 mg/dL — ABNORMAL LOW (ref 8.9–10.3)
Chloride: 108 mmol/L (ref 98–111)
Creatinine, Ser: 0.82 mg/dL (ref 0.44–1.00)
GFR, Estimated: >60 mL/min (ref >60)
Glucose, Bld: 99 mg/dL (ref 70–99)
Potassium: 3.8 mmol/L (ref 3.5–5.1)
Sodium: 140 mmol/L (ref 135–145)
Total Bilirubin: 0.6 mg/dL (ref 0.3–1.2)
Total Protein: 5.6 g/dL — ABNORMAL LOW (ref 6.5–8.1)

## 2021-10-24 LAB — CBC WITH DIFFERENTIAL/PLATELET
Abs Immature Granulocytes: 0.01 10*3/uL (ref 0.00–0.07)
Basophils Absolute: 0.1 10*3/uL (ref 0.0–0.1)
Basophils Relative: 1 %
Eosinophils Absolute: 0.3 10*3/uL (ref 0.0–0.5)
Eosinophils Relative: 5 %
HCT: 33.1 % — ABNORMAL LOW (ref 36.0–46.0)
Hemoglobin: 10.9 g/dL — ABNORMAL LOW (ref 12.0–15.0)
Immature Granulocytes: 0 %
Lymphocytes Relative: 38 %
Lymphs Abs: 1.9 10*3/uL (ref 0.7–4.0)
MCH: 30 pg (ref 26.0–34.0)
MCHC: 32.9 g/dL (ref 30.0–36.0)
MCV: 91.2 fL (ref 80.0–100.0)
Monocytes Absolute: 0.8 10*3/uL (ref 0.1–1.0)
Monocytes Relative: 16 %
Neutro Abs: 2 10*3/uL (ref 1.7–7.7)
Neutrophils Relative %: 40 %
Platelets: 274 10*3/uL (ref 150–400)
RBC: 3.63 MIL/uL — ABNORMAL LOW (ref 3.87–5.11)
RDW: 13.7 % (ref 11.5–15.5)
WBC: 5 10*3/uL (ref 4.0–10.5)
nRBC: 0 % (ref 0.0–0.2)

## 2021-10-24 LAB — PHOSPHORUS: Phosphorus: 4.2 mg/dL (ref 2.5–4.6)

## 2021-10-24 LAB — MAGNESIUM: Magnesium: 1.8 mg/dL (ref 1.7–2.4)

## 2021-10-24 NOTE — Progress Notes (Signed)
Chart reviewed - pt is a 55 yo s/p TAH who is admitted for c. Diff pancolitis on 12/21. Incidental finding of 7x5 cm complex cyst noted on CT (I reviewed the images). No ascites or LAN.   Pt also notes "vaginal lesions". She declines exam by multiple attendings and nurses and would prefer a gynecologist to look. These have been present since admission. They have tried miconazole without relief. They may try Diflucan in the interim as well.  Other possibility would be HSV (pt could self swab or we can swab when we come to do consult) or less likely would be vulvar dysplasia/malignancy.    Spoke with primary attending - agreeable to [place] seeing pt on Tuesday after we have done TVUS and CA-125. At that time we can inspect the lesions as well. Patient has been added to our list and we will follow along peripherally until we see the patient in person.   Radene Gunning, MD

## 2021-10-24 NOTE — Progress Notes (Signed)
Pt ordered vaginal self swabs for wet prep and vaginal lesions for HSV. Discussed w CN and AC, Mds notified that we are unable to collect these specimens [name] and [name]).

## 2021-10-24 NOTE — Progress Notes (Signed)
PROGRESS NOTE    Jacqueline Hughes  ERX:[id number] DOB: [birth year] DOA: 10/19/2021 PCP: Donella Stade, PA-C   Brief Narrative: The patient is a 55 year old Caucasian female with a past medical history significant for but not limited to acquired hypothyroidism, allergic rhinitis, generalized anxiety disorder as well as other comorbidities who presented to the hospital on 10/19/2021 by way of transfer from [facility] ED with sepsis due to C. difficile colitis after he presented from home to the facility complaining of significant diarrhea.  She reports 2 weeks of loose stool that progressively became more frequent over the timeframe the last 3 to 4 days she has had increased frequency resulting in at least 10-15 episodes of loose stool in the absence of any melena or hematochezia.  She does note diffuse crampy abdominal discomfort that worsens with palpation of the abdomen and reports intermittent nausea but no vomiting.  Last 1 to 2 days she is developed a fever with a T-max of 102.  She denies any chills he does not know of any history of inflammatory bowel disease he does not have any history of Crohn's or ulcerative colitis.  She also denies any history of autoimmune related enterocolitis and no history of celiac's disease.  She recently reports that she completed a course of Augmentin approximately a month ago for treatment of acute sinusitis and denies any PPIs.  She came to the ED for these complaints and CT of the abdomen pelvis was done and showed pancolitis that was felt to be consistent with infectious versus inflammatory colitis but no evidence of perforation, obstruction or abscess.  While in the ED she was given Bentyl, Zofran and p.o. vancomycin and admitted to [facility] for the management of sepsis due to C. difficile colitis.  She is now currently still having quite a bit of bowel movements and complaining of some burning and discomfort" lesions in her vaginal area."  Patient refuses for  [place] to examine her and requested gynecological evaluation called the GYN doctor who states that they will be by her on Tuesday.  In the interim they are recommending a transvaginal ultrasound as well as a Ca1 25 tumor marker.  Patient was noted to have a complex appearing 7.9 x 5.8 cm left adnexal lesion so we will work this up further with a transvaginal ultrasound and gynecological evaluation Tuesday.   Assessment & Plan:   Principal Problem:   C. difficile colitis Active Problems:   Diarrhea   Pancolitis ([place])   Sepsis ([place])   Leukocytosis   Acute hyponatremia   Thyroid disease   Vaginal candidiasis  Sepsis secondary to C. difficile colitis -He met sepsis criteria on admission she had WBCs greater than 12,000, heart rate greater than 90, and a temperature greater than 38 C with a source of infection from C. Difficile -CT of the abdomen pelvis done and showed pancolitis consistent with infectious process -C. difficile antigen toxin was positive -She recently takes Augmentin which increases risk for development of pseudomembranous colitis -We will continue supportive care and patient was given 500 mL bolus and will start back on maintenance IV fluids at 75 MLS per hour given the amount of diarrhea she is having -Continue with vancomycin p.o. 125 mg p.o. 4 times daily for 10 days -Blood cultures x2 showed no growth to date at 3 days -Last lactic acid level was checked and was 0.8 -Initiated on probiotics with acidophilus 2 capsules p.o. 3 times daily -Continue with dicyclomine 10 mg p.o.  3 times daily before meals -Sepsis physiology is improved and she is no longer febrile and no longer has a WBC but  was a little hypotensive so she was given 500 mL bolus and was continued on maintenance IV fluid at 75 MLS per hour -Patient states that her diarrhea is slowing down a little bit and states that she 23 bowel movements yesterday and has had 13 today and still has no 3 hours for another  24-hour count.  Normocytic Anemia -Improved and patient's hemoglobin/hematocrit went from 11.1/33.5 is now 12.1/36.7 yesterday but today is now 10.9 and 33.1 and likely a dilutional drop -Check anemia panel in the a.m. -Continue to monitor for signs and symptoms of bleeding; currently no overt bleeding noted -Repeat CBC in a.m.  Suspected vaginal candidiasis -She is initiated on miconazole vaginal suppository 100 mg x 7 days -Given that she continues to have burning and discomfort and "lesions" we will check a urinalysis -May need some Diflucan but will evaluate her burning discomfort in her urine first -Initial urinalysis showed a small color urine with moderate hemoglobin, large leukocytes, negative nitrites, rare bacteria, 0-5 RBCs per high-power field, 0-5 squamous epithelial cells, 11-20 WBCs and urine cultures not done we will order repeat urinalysis and urine culture today -He has lesions and will not allow [place] to look at these lesions we have requested a gynecological evaluation and will be done on Tuesday -In the interim we will obtain a transvaginal ultrasound and CA 125 -Gynecology is also ordered her self swabs for HSV as well as BV, trichomonas and Yeast  Hyponatremia -Improved -Sodium went from 129 trended up to 140 -Continue with IV fluid hydration with normal saline at 75 mils per hour -Continue to monitor and trend and repeat CMP in a.m.  Acquired Hypothyroidism -TSH was checked and was 2.586 -Home medications do not reflect the patient being on Synthroid -Free T4 0.96  Left-sided adnexal lesion -Found on CT scan is noted to be complex appearing 7.9 x 5.8 cm left adnexal lesion -We will obtain a CA125 level as well as a pelvic and transvaginal ultrasound -Have asked gynecology to evaluate and they will see the patient Tuesday, 10/26/2021  Hypokalemia -Patient's K+ is stable at 3.8 -Continue monitor and replete as necessary -Repeat CMP in the AM    Hypomagnesemia -Improved.  Patient's mag leve is now 1.8 and will replete again -Continue to monitor and replete as necessary -Repeat CMP in a.m.  DVT prophylaxis: SCDs but will initiate DVT prophylaxis pharmacologically Code Status: FULL CODE  Family Communication: No family currently at bedside Disposition Plan: Pending further clinical improvement in her diarrhea  Status is: Inpatient  Remains inpatient appropriate because: Patient continues to have significant amount of diarrhea and has at least 10-15 bowel movements currently they are all loose and watery  Consultants:  None  Procedures:  None  Antimicrobials:  Anti-infectives (From admission, onward)    Start     Dose/Rate Route Frequency Ordered Stop   10/20/21 1000  vancomycin (VANCOCIN) capsule 125 mg        125 mg Oral 4 times daily 10/20/21 0031 10/30/21 0959        Subjective: Seen and examined at bedside and states that her diarrhea is still present and it is all liquidy and watery.  Thinks is slowing down a little bit and says she has only had 13 bowel movements since 1 AM this morning.  No chest pain or shortness of breath.  Still complain about  a vaginal lesion and discomfort.  No lightheadedness or dizziness.  No other concerns or complaints this time.  Objective: Vitals:   10/23/21 0539 10/23/21 1432 10/23/21 2111 10/24/21 0653  BP: (!) 88/59 100/71 (!) 92/57 92/69  Pulse: 83 87 76 70  Resp: '18 18 18 18  ' Temp: 98.5 F (36.9 C) 98.4 F (36.9 C) 98.6 F (37 C) 98.3 F (36.8 C)  TempSrc: Oral Oral Oral Oral  SpO2: 97% 97% 98% 96%  Weight:      Height:        Intake/Output Summary (Last 24 hours) at 10/24/2021 1155 Last data filed at 10/24/2021 0263 Gross per 24 hour  Intake 2624.85 ml  Output 0 ml  Net 2624.85 ml    Filed Weights   10/19/21 1844  Weight: 67.1 kg   Examination: Physical Exam:  Constitutional: Well-nourished, well-developed thin Caucasian female currently no acute  distress appears calm Eyes: Lids and conjunctivae normal, sclerae anicteric  ENMT: External Ears, Nose appear normal. Grossly normal hearing. Mucous membranes are moist. Neck: Appears normal, supple, no cervical masses, normal ROM, no appreciable thyromegaly; no appreciable JVD Respiratory: Diminished to auscultation bilaterally with coarse breath sounds, no wheezing, rales, rhonchi or crackles. Normal respiratory effort and patient is not tachypenic. No accessory muscle use.  Unlabored breathing Cardiovascular: RRR, no murmurs / rubs / gallops. S1 and S2 auscultated.  No appreciable extremity edema Abdomen: Soft, mildly-tender, non-distended. Bowel sounds positive.  GU: Deferred. Musculoskeletal: No clubbing / cyanosis of digits/nails. No joint deformity upper and lower extremities.  Skin: No rashes, lesions, ulcers on limited skin evaluation. No induration; Warm and dry.  Neurologic: CN 2-12 grossly intact with no focal deficits. Romberg sign and cerebellar reflexes not assessed.  Psychiatric: Normal judgment and insight. Alert and oriented x 3. Normal mood and appropriate affect.   Data Reviewed: I have personally reviewed following labs and imaging studies  CBC: Recent Labs  Lab 10/19/21 2112 10/20/21 0446 10/22/21 0505 10/23/21 0435 10/24/21 0504  WBC 12.4* 7.7 4.7 5.1 5.0  NEUTROABS 10.6* 6.3 2.2 2.3 2.0  HGB 13.0 11.6* 11.1* 12.1 10.9*  HCT 39.2 35.1* 33.5* 36.7 33.1*  MCV 90.5 91.2 91.0 90.2 91.2  PLT 247 192 241 295 785    Basic Metabolic Panel: Recent Labs  Lab 10/19/21 2112 10/20/21 0446 10/22/21 0505 10/23/21 0435 10/24/21 0504  NA 129* 135 141 141 140  K 3.5 3.2* 3.9 4.0 3.8  CL 95* 101 107 106 108  CO2 '27 27 27 27 27  ' GLUCOSE 127* 103* 92 97 99  BUN 12 8 <5* <5* 16  CREATININE 0.75 0.73 0.52 0.59 0.82  CALCIUM 8.2* 8.0* 8.4* 8.5* 8.1*  MG 1.7 1.6* 2.1 2.0 1.8  PHOS  --   --  2.8 4.0 4.2    GFR: Estimated Creatinine Clearance: 79.1 mL/min (by C-G  formula based on SCr of 0.82 mg/dL). Liver Function Tests: Recent Labs  Lab 10/19/21 2112 10/20/21 0446 10/22/21 0505 10/23/21 0435 10/24/21 0504  AST '19 19 18 18 15  ' ALT '14 13 14 14 14  ' ALKPHOS 44 37* 34* 43 43  BILITOT 0.6 0.5 0.5 0.4 0.6  PROT 6.7 5.6* 5.8* 6.3* 5.6*  ALBUMIN 3.4* 3.0* 3.0* 3.3* 2.8*    Recent Labs  Lab 10/19/21 2112  LIPASE 22    No results for input(s): AMMONIA in the last 168 hours. Coagulation Profile: No results for input(s): INR, PROTIME in the last 168 hours. Cardiac Enzymes: No results for input(s):  CKTOTAL, CKMB, CKMBINDEX, TROPONINI in the last 168 hours. BNP (last 3 results) No results for input(s): PROBNP in the last 8760 hours. HbA1C: No results for input(s): HGBA1C in the last 72 hours. CBG: No results for input(s): GLUCAP in the last 168 hours. Lipid Profile: No results for input(s): CHOL, HDL, LDLCALC, TRIG, CHOLHDL, LDLDIRECT in the last 72 hours. Thyroid Function Tests: Recent Labs    10/23/21 0435  FREET4 0.96    Anemia Panel: No results for input(s): VITAMINB12, FOLATE, FERRITIN, TIBC, IRON, RETICCTPCT in the last 72 hours. Sepsis Labs: Recent Labs  Lab 10/20/21 0446  LATICACIDVEN 0.8     Recent Results (from the past 240 hour(s))  C Difficile Quick Screen w PCR reflex     Status: Abnormal   Collection Time: 10/19/21  8:50 PM   Specimen: STOOL  Result Value Ref Range Status   C Diff antigen POSITIVE (A) NEGATIVE Final    Comment: CRITICAL RESULT CALLED TO, READ BACK BY AND VERIFIED WITH:  KELLY NEAL RN 10/20/2021 '@0028'  BY JW    C Diff toxin POSITIVE (A) NEGATIVE Final   C Diff interpretation Toxin producing C. difficile detected.  Final    Comment: Performed at [facility], [place] [address] [place], [place] [address]  Gastrointestinal Panel by PCR , Stool     Status: None   Collection Time: 10/19/21  8:50 PM   Specimen: STOOL  Result Value Ref Range Status   Campylobacter species NOT DETECTED NOT  DETECTED Final   Plesimonas shigelloides NOT DETECTED NOT DETECTED Final   Salmonella species NOT DETECTED NOT DETECTED Final   Yersinia enterocolitica NOT DETECTED NOT DETECTED Final   Vibrio species NOT DETECTED NOT DETECTED Final   Vibrio cholerae NOT DETECTED NOT DETECTED Final   Enteroaggregative E coli (EAEC) NOT DETECTED NOT DETECTED Final   Enteropathogenic E coli (EPEC) NOT DETECTED NOT DETECTED Final   Enterotoxigenic E coli (ETEC) NOT DETECTED NOT DETECTED Final   Shiga like toxin producing E coli (STEC) NOT DETECTED NOT DETECTED Final   Shigella/Enteroinvasive E coli (EIEC) NOT DETECTED NOT DETECTED Final   Cryptosporidium NOT DETECTED NOT DETECTED Final   Cyclospora cayetanensis NOT DETECTED NOT DETECTED Final   Entamoeba histolytica NOT DETECTED NOT DETECTED Final   Giardia lamblia NOT DETECTED NOT DETECTED Final   Adenovirus F40/41 NOT DETECTED NOT DETECTED Final   Astrovirus NOT DETECTED NOT DETECTED Final   Norovirus GI/GII NOT DETECTED NOT DETECTED Final   Rotavirus A NOT DETECTED NOT DETECTED Final   Sapovirus (I, II, IV, and V) NOT DETECTED NOT DETECTED Final    Comment: Performed at [facility], [address] [place], [place] [address]  Culture, blood (routine x 2)     Status: None (Preliminary result)   Collection Time: 10/20/21 12:10 AM   Specimen: BLOOD  Result Value Ref Range Status   Specimen Description   Final    BLOOD LEFT ANTECUBITAL Performed at [facility], [address] [place], [place] [address]    Special Requests   Final    BOTTLES DRAWN AEROBIC AND ANAEROBIC Blood Culture adequate volume Performed at [facility], [address] [place], [place] [address]    Culture   Final    NO GROWTH 3 DAYS Performed at [facility], [address], [place], [place] [address]    Report Status PENDING  Incomplete  Culture, blood (routine x 2)     Status:  None (Preliminary result)   Collection Time: 10/20/21  12:16 AM   Specimen: BLOOD  Result Value Ref Range Status   Specimen Description   Final    BLOOD RIGHT ANTECUBITAL Performed at [facility] [address] [place], [place] [address]    Special Requests   Final    BOTTLES DRAWN AEROBIC AND ANAEROBIC Blood Culture results may not be optimal due to an excessive volume of blood received in culture bottles Performed at [facility], [address] [place], [place] [address]    Culture   Final    NO GROWTH 3 DAYS Performed at [facility], [place] [address] [place], [place] [address]    Report Status PENDING  Incomplete  Resp Panel by RT-PCR (Flu A&B, Covid)     Status: None   Collection Time: 10/20/21 12:16 AM   Specimen: Nasopharyngeal(NP) swabs in vial transport medium  Result Value Ref Range Status   SARS Coronavirus 2 by RT PCR NEGATIVE NEGATIVE Final    Comment: (NOTE) SARS-CoV-2 target nucleic acids are NOT DETECTED.  The SARS-CoV-2 RNA is generally detectable in upper respiratory specimens during the acute phase of infection. The lowest concentration of SARS-CoV-2 viral copies this assay can detect is 138 copies/mL. A negative result does not preclude SARS-Cov-2 infection and should not be used as the sole basis for treatment or other patient management decisions. A negative result may occur with  improper specimen collection/handling, submission of specimen other than nasopharyngeal swab, presence of viral mutation(s) within the areas targeted by this assay, and inadequate number of viral copies(<138 copies/mL). A negative result must be combined with clinical observations, patient history, and epidemiological information. The expected result is Negative.  Fact Sheet for Patients:  EntrepreneurPulse.com.au  Fact Sheet for Healthcare Providers:  IncredibleEmployment.be  This test is no t yet approved or cleared by the [place] FDA and  has been  authorized for detection and/or diagnosis of SARS-CoV-2 by FDA under an Emergency Use Authorization (EUA). This EUA will remain  in effect (meaning this test can be used) for the duration of the COVID-19 declaration under Section 564(b)(1) of the Act, 21 U.S.C.section 360bbb-3(b)(1), unless the authorization is terminated  or revoked sooner.       Influenza A by PCR NEGATIVE NEGATIVE Final   Influenza B by PCR NEGATIVE NEGATIVE Final    Comment: (NOTE) The Xpert Xpress SARS-CoV-2/FLU/RSV plus assay is intended as an aid in the diagnosis of influenza from Nasopharyngeal swab specimens and should not be used as a sole basis for treatment. Nasal washings and aspirates are unacceptable for Xpert Xpress SARS-CoV-2/FLU/RSV testing.  Fact Sheet for Patients: EntrepreneurPulse.com.au  Fact Sheet for Healthcare Providers: IncredibleEmployment.be  This test is not yet approved or cleared by the [place] FDA and has been authorized for detection and/or diagnosis of SARS-CoV-2 by FDA under an Emergency Use Authorization (EUA). This EUA will remain in effect (meaning this test can be used) for the duration of the COVID-19 declaration under Section 564(b)(1) of the Act, 21 U.S.C. section 360bbb-3(b)(1), unless the authorization is terminated or revoked.  Performed at [facility], [address] [place], [place] [address]      RN Pressure Injury Documentation:     Estimated body mass index is 22.5 kg/m as calculated from the following:   Height as of this encounter: '5\' 8"'  (1.727 m).   Weight as of this encounter: 67.1 kg.  Malnutrition Type:  Malnutrition Characteristics:   Nutrition Interventions:     Radiology Studies: No results found.  Scheduled Meds:  acidophilus  2 capsule Oral TID   dicyclomine  10 mg Oral TID AC   fluticasone  2 spray Each Nare Daily   miconazole  100 mg Vaginal QHS   nystatin  5 mL Oral QID    vancomycin  125 mg Oral QID   Continuous Infusions:  sodium chloride 75 mL/hr at 10/24/21 0020    LOS: 4 days   Kerney Elbe, DO [facility] PAGER is on AMION  If 7PM-7AM, please contact night-coverage [email or web address]

## 2021-10-25 LAB — CULTURE, BLOOD (ROUTINE X 2)
Culture: NO GROWTH
Culture: NO GROWTH
Special Requests: ADEQUATE

## 2021-10-25 LAB — CBC WITH DIFFERENTIAL/PLATELET
Abs Immature Granulocytes: 0.02 10*3/uL (ref 0.00–0.07)
Basophils Absolute: 0 10*3/uL (ref 0.0–0.1)
Basophils Relative: 1 %
Eosinophils Absolute: 0.2 10*3/uL (ref 0.0–0.5)
Eosinophils Relative: 5 %
HCT: 34 % — ABNORMAL LOW (ref 36.0–46.0)
Hemoglobin: 11.3 g/dL — ABNORMAL LOW (ref 12.0–15.0)
Immature Granulocytes: 1 %
Lymphocytes Relative: 44 %
Lymphs Abs: 1.9 10*3/uL (ref 0.7–4.0)
MCH: 30.4 pg (ref 26.0–34.0)
MCHC: 33.2 g/dL (ref 30.0–36.0)
MCV: 91.4 fL (ref 80.0–100.0)
Monocytes Absolute: 0.8 10*3/uL (ref 0.1–1.0)
Monocytes Relative: 18 %
Neutro Abs: 1.3 10*3/uL — ABNORMAL LOW (ref 1.7–7.7)
Neutrophils Relative %: 31 %
Platelets: 316 10*3/uL (ref 150–400)
RBC: 3.72 MIL/uL — ABNORMAL LOW (ref 3.87–5.11)
RDW: 13.8 % (ref 11.5–15.5)
WBC: 4.2 10*3/uL (ref 4.0–10.5)
nRBC: 0 % (ref 0.0–0.2)

## 2021-10-25 LAB — URINE CULTURE: Culture: NO GROWTH

## 2021-10-25 LAB — COMPREHENSIVE METABOLIC PANEL
ALT: 14 U/L (ref 0–44)
AST: 14 U/L — ABNORMAL LOW (ref 15–41)
Albumin: 3 g/dL — ABNORMAL LOW (ref 3.5–5.0)
Alkaline Phosphatase: 47 U/L (ref 38–126)
Anion gap: 6 (ref 5–15)
BUN: 10 mg/dL (ref 6–20)
CO2: 25 mmol/L (ref 22–32)
Calcium: 8.1 mg/dL — ABNORMAL LOW (ref 8.9–10.3)
Chloride: 108 mmol/L (ref 98–111)
Creatinine, Ser: 0.48 mg/dL (ref 0.44–1.00)
GFR, Estimated: >60 mL/min (ref >60)
Glucose, Bld: 90 mg/dL (ref 70–99)
Potassium: 3.6 mmol/L (ref 3.5–5.1)
Sodium: 139 mmol/L (ref 135–145)
Total Bilirubin: 0.3 mg/dL (ref 0.3–1.2)
Total Protein: 5.8 g/dL — ABNORMAL LOW (ref 6.5–8.1)

## 2021-10-25 LAB — MAGNESIUM: Magnesium: 1.8 mg/dL (ref 1.7–2.4)

## 2021-10-25 LAB — PHOSPHORUS: Phosphorus: 3.7 mg/dL (ref 2.5–4.6)

## 2021-10-25 MED ORDER — MAGNESIUM SULFATE 2 GM/50ML IV SOLN
2.0000 g | Freq: Once | INTRAVENOUS | Status: AC
  Administered 2021-10-25: 12:00:00 2 g via INTRAVENOUS
  Filled 2021-10-25: qty 50

## 2021-10-25 MED ORDER — POTASSIUM CHLORIDE CRYS ER 20 MEQ PO TBCR
40.0000 meq | EXTENDED_RELEASE_TABLET | Freq: Once | ORAL | Status: AC
  Administered 2021-10-25: 15:00:00 40 meq via ORAL
  Filled 2021-10-25: qty 2

## 2021-10-25 NOTE — Progress Notes (Signed)
PROGRESS NOTE    Jacqueline Hughes  KGU:[id number] DOB: 1966-01-17 DOA: 10/19/2021 PCP: Donella Stade, PA-C   Brief Narrative: The patient is a 55 year old Caucasian female with a past medical history significant for but not limited to acquired hypothyroidism, allergic rhinitis, generalized anxiety disorder as well as other comorbidities who presented to the hospital on 10/19/2021 by way of transfer from [facility] ED with sepsis due to C. difficile colitis after he presented from home to the facility complaining of significant diarrhea.  She reports 2 weeks of loose stool that progressively became more frequent over the timeframe the last 3 to 4 days she has had increased frequency resulting in at least 10-15 episodes of loose stool in the absence of any melena or hematochezia.  She does note diffuse crampy abdominal discomfort that worsens with palpation of the abdomen and reports intermittent nausea but no vomiting.  Last 1 to 2 days she is developed a fever with a T-max of 102.  She denies any chills he does not know of any history of inflammatory bowel disease he does not have any history of Crohn's or ulcerative colitis.  She also denies any history of autoimmune related enterocolitis and no history of celiac's disease.  She recently reports that she completed a course of Augmentin approximately a month ago for treatment of acute sinusitis and denies any PPIs.  She came to the ED for these complaints and CT of the abdomen pelvis was done and showed pancolitis that was felt to be consistent with infectious versus inflammatory colitis but no evidence of perforation, obstruction or abscess.  While in the ED she was given Bentyl, Zofran and p.o. vancomycin and admitted to [facility] for the management of sepsis due to C. difficile colitis.  She is now currently still having quite a bit of bowel movements and complaining of some burning and discomfort" lesions in her vaginal area."  Patient refuses for  [place] to examine her and requested gynecological evaluation called the GYN doctor who states that they will be by her on Tuesday.  In the interim they are recommending a transvaginal ultrasound as well as a Ca1 25 tumor marker.  Patient was noted to have a complex appearing 7.9 x 5.8 cm left adnexal lesion so we will work this up further with a transvaginal ultrasound and gynecological evaluation Tuesday.  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  Discussed the case with Gyn Onc who feels that she can be managed by regular gynecology.  Gynecology to see the patient in the morning.  Assessment & Plan:   Principal Problem:   C. difficile colitis Active Problems:   Diarrhea   Pancolitis ([place])   Sepsis ([employer])   Leukocytosis   Acute hyponatremia   Thyroid disease   Vaginal candidiasis  Sepsis secondary to C. difficile colitis, improving -He met sepsis criteria on admission she had WBCs greater than 12,000, heart rate greater than 90, and a temperature greater than 38 C with a source of infection from C. Difficile -CT of the abdomen pelvis done and showed pancolitis consistent with infectious process -C. difficile antigen toxin was positive -She recently takes Augmentin which increases risk for development of pseudomembranous colitis -We will continue supportive care and patient was given 500 mL bolus and will start back on maintenance IV fluids at 75 MLS per hour given the amount of diarrhea she is having -Continue  with vancomycin p.o. 125 mg p.o. 4 times daily for 10 days -Blood cultures x2 showed no growth to date at 5 days -Last lactic acid level was checked and was 0.8 -Initiated on probiotics with acidophilus 2 capsules p.o. 3 times daily -Continue with dicyclomine 10 mg p.o. 3 times daily before meals -Sepsis physiology is improved and she is no longer febrile and no longer has  a WBC as it is 4.2 was a little hypotensive so she was given 500 mL bolus and was continued on maintenance IV fluid at 75 MLS per hour -Patient states that her diarrhea has only had 7 bowel movement since 1 AM this morning  Normocytic Anemia -Stable as patient's hemoglobin/hematocrit is now 11.2/34.0 and likely had a dilutional drop -Check anemia panel in the a.m. -Continue to monitor for signs and symptoms of bleeding; currently no overt bleeding noted -Repeat CBC in a.m.  Suspected vaginal candidiasis -She is initiated on miconazole vaginal suppository 100 mg x 7 days -Given that she continues to have burning and discomfort and "lesions" we will check a urinalysis -May need some Diflucan but will evaluate her burning discomfort in her urine first -Initial urinalysis showed a small color urine with moderate hemoglobin, large leukocytes, negative nitrites, rare bacteria, 0-5 RBCs per high-power field, 0-5 squamous epithelial cells, 11-20 WBCs and urine cultures not done we will order repeat urinalysis and urine culture today -He has lesions and will not allow [place] to look at these lesions we have requested a gynecological evaluation and will be done on Tuesday -In the interim we will obtain a transvaginal ultrasound and CA 125 -Gynecology is also ordered her self swabs for HSV as well as BV, trichomonas and Yeast however they do not do them on the floor so gynecology will evaluate the patient tomorrow  Hyponatremia -Improved -Sodium went from 129 trended up to 140 yesterday and now 139 -Continue with IV fluid hydration with normal saline at 75 mils per hour -Continue to monitor and trend and repeat CMP in a.m.  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  Discussed the case with Gyn Onc who feels that she can be managed by regular gynecology. -Have asked gynecology to evaluate and they will see the patient Tuesday, 10/26/2021  Hypokalemia -Patient's K+ is stable at 3.6 -Will give a dose of po Kcl 40 mEQ  -Continue monitor and replete as necessary -Repeat CMP in the AM   Hypomagnesemia -Improved.  Patient's mag leve is now 1.8 and will replete again with IV Mag Sulfate 2 grams -Continue to monitor and replete as necessary -Repeat CMP in a.m.  DVT prophylaxis: SCDs but will initiate DVT prophylaxis pharmacologically Code Status: FULL CODE  Family Communication: No family currently at bedside Disposition Plan: Pending further clinical improvement in her diarrhea  Status is: Inpatient  Remains inpatient appropriate because: Patient continues to have significant amount of diarrhea and has at least 10-15 bowel movements currently they are all loose and watery  Consultants:  None  Procedures:  None  Antimicrobials:  Anti-infectives (From admission, onward)  Start     Dose/Rate Route Frequency Ordered Stop   10/20/21 1000  vancomycin (VANCOCIN) capsule 125 mg        125 mg Oral 4 times daily 10/20/21 0031 10/30/21 0959        Subjective: Seen and examined at bedside and states that her diarrhea i is slowing down and has only had 7 bowel movements since 1 AM this morning.  No chest pain or shortness of breath.  States that the crampy abdominal pain is improving.  No other concerns or complaints at this time.  Objective: Vitals:   10/24/21 1452 10/24/21 2147 10/25/21 0608 10/25/21 0700  BP: 90/80 100/70 94/65   Pulse: 86 78 76   Resp: '14 18 17   ' Temp: 98.5 F (36.9 C) 98.2 F (36.8 C) 98.3 F (36.8 C)   TempSrc: Oral  Oral Oral   SpO2: 100% 98% 98%   Weight:    68.1 kg  Height:        Intake/Output Summary (Last 24 hours) at 10/25/2021 1333 Last data filed at 10/25/2021 1000 Gross per 24 hour  Intake 2580.44 ml  Output 0 ml  Net 2580.44 ml    Filed Weights   10/19/21 1844 10/25/21 0700  Weight: 67.1 kg 68.1 kg   Examination: Physical Exam:  Constitutional: WN/WD thin Caucasian female in NAD appears calm Eyes: Lids and conjunctivae normal, sclerae anicteric  ENMT: External Ears, Nose appear normal. Grossly normal hearing.  Neck: Appears normal, supple, no cervical masses, normal ROM, no appreciable thyromegaly; no JVD Respiratory: Diminished to auscultation bilaterally, no wheezing, rales, rhonchi or crackles. Normal respiratory effort and patient is not tachypenic. No accessory muscle use.  Cardiovascular: RRR, no murmurs / rubs / gallops. S1 and S2 auscultated.  No appreciable extremity edema Abdomen: Soft, non-tender, non-distended. Bowel sounds positive.  GU: Deferred. Musculoskeletal: No clubbing / cyanosis of digits/nails. No joint deformity upper and lower extremities.  Skin: No rashes, lesions, ulcers on a limited skin evaluation. No induration; Warm and dry.  Neurologic: CN 2-12 grossly intact with no focal deficits. Romberg sign and cerebellar reflexes not assessed.  Psychiatric: Normal judgment and insight. Alert and oriented x 3. Normal mood and appropriate affect.   Data Reviewed: I have personally reviewed following labs and imaging studies  CBC: Recent Labs  Lab 10/20/21 0446 10/22/21 0505 10/23/21 0435 10/24/21 0504 10/25/21 0427  WBC 7.7 4.7 5.1 5.0 4.2  NEUTROABS 6.3 2.2 2.3 2.0 1.3*  HGB 11.6* 11.1* 12.1 10.9* 11.3*  HCT 35.1* 33.5* 36.7 33.1* 34.0*  MCV 91.2 91.0 90.2 91.2 91.4  PLT 192 241 295 274 428    Basic Metabolic Panel: Recent Labs  Lab 10/20/21 0446 10/22/21 0505 10/23/21 0435 10/24/21 0504 10/25/21 0427  NA 135 141 141 140 139  K 3.2* 3.9  4.0 3.8 3.6  CL 101 107 106 108 108  CO2 '27 27 27 27 25  ' GLUCOSE 103* 92 97 99 90  BUN 8 <5* <5* 16 10  CREATININE 0.73 0.52 0.59 0.82 0.48  CALCIUM 8.0* 8.4* 8.5* 8.1* 8.1*  MG 1.6* 2.1 2.0 1.8 1.8  PHOS  --  2.8 4.0 4.2 3.7    GFR: Estimated Creatinine Clearance: 81.1 mL/min (by C-G formula based on SCr of 0.48 mg/dL). Liver Function Tests: Recent Labs  Lab 10/20/21 0446 10/22/21 0505 10/23/21 0435 10/24/21 0504 10/25/21 0427  AST '19 18 18 15 ' 14*  ALT '13 14 14 14 14  ' ALKPHOS 37* 34* 43 43 47  BILITOT 0.5 0.5 0.4 0.6 0.3  PROT 5.6* 5.8* 6.3* 5.6* 5.8*  ALBUMIN 3.0* 3.0* 3.3* 2.8* 3.0*    Recent Labs  Lab 10/19/21 2112  LIPASE 22    No results for input(s): AMMONIA in the last 168 hours. Coagulation Profile: No results for input(s): INR, PROTIME in the last 168 hours. Cardiac Enzymes: No results for input(s): CKTOTAL, CKMB, CKMBINDEX, TROPONINI in the last 168 hours. BNP (last 3 results) No results for input(s): PROBNP in the last 8760 hours. HbA1C: No results for input(s): HGBA1C in the last 72 hours. CBG: No results for input(s): GLUCAP in the last 168 hours. Lipid Profile: No results for input(s): CHOL, HDL, LDLCALC, TRIG, CHOLHDL, LDLDIRECT in the last 72 hours. Thyroid Function Tests: Recent Labs    10/23/21 0435  FREET4 0.96    Anemia Panel: No results for input(s): VITAMINB12, FOLATE, FERRITIN, TIBC, IRON, RETICCTPCT in the last 72 hours. Sepsis Labs: Recent Labs  Lab 10/20/21 0446  LATICACIDVEN 0.8     Recent Results (from the past 240 hour(s))  C Difficile Quick Screen w PCR reflex     Status: Abnormal   Collection Time: 10/19/21  8:50 PM   Specimen: STOOL  Result Value Ref Range Status   C Diff antigen POSITIVE (A) NEGATIVE Final    Comment: CRITICAL RESULT CALLED TO, READ BACK BY AND VERIFIED WITH:  KELLY NEAL RN 10/20/2021 '@0028'  BY JW    C Diff toxin POSITIVE (A) NEGATIVE Final   C Diff interpretation Toxin producing C. difficile  detected.  Final    Comment: Performed at [facility], [place] [address], [place], [place] [address]  Gastrointestinal Panel by PCR , Stool     Status: None   Collection Time: 10/19/21  8:50 PM   Specimen: STOOL  Result Value Ref Range Status   Campylobacter species NOT DETECTED NOT DETECTED Final   Plesimonas shigelloides NOT DETECTED NOT DETECTED Final   Salmonella species NOT DETECTED NOT DETECTED Final   Yersinia enterocolitica NOT DETECTED NOT DETECTED Final   Vibrio species NOT DETECTED NOT DETECTED Final   Vibrio cholerae NOT DETECTED NOT DETECTED Final   Enteroaggregative E coli (EAEC) NOT DETECTED NOT DETECTED Final   Enteropathogenic E coli (EPEC) NOT DETECTED NOT DETECTED Final   Enterotoxigenic E coli (ETEC) NOT DETECTED NOT DETECTED Final   Shiga like toxin producing E coli (STEC) NOT DETECTED NOT DETECTED Final   Shigella/Enteroinvasive E coli (EIEC) NOT DETECTED NOT DETECTED Final   Cryptosporidium NOT DETECTED NOT DETECTED Final   Cyclospora cayetanensis NOT DETECTED NOT DETECTED Final   Entamoeba histolytica NOT DETECTED NOT DETECTED Final   Giardia lamblia NOT DETECTED NOT DETECTED Final   Adenovirus F40/41 NOT DETECTED NOT DETECTED Final   Astrovirus NOT DETECTED NOT DETECTED Final   Norovirus GI/GII NOT DETECTED NOT DETECTED Final   Rotavirus A NOT DETECTED NOT DETECTED Final   Sapovirus (I, II, IV, and V) NOT DETECTED NOT DETECTED Final    Comment: Performed at [facility], [address] [place], [place] [address]  Culture, blood (routine x 2)     Status: None   Collection Time: 10/20/21 12:10 AM   Specimen: BLOOD  Result Value Ref Range Status   Specimen Description   Final    BLOOD LEFT ANTECUBITAL Performed at [facility], [address] [place], [place] [address]    Special Requests   Final    BOTTLES DRAWN AEROBIC AND ANAEROBIC Blood Culture  adequate volume Performed at [facility], [address] [place], [place] [address]    Culture   Final    NO GROWTH 5 DAYS Performed at [facility], [place] [address], [place], [place] [address]    Report Status 10/25/2021 FINAL  Final  Culture, blood (routine x 2)     Status: None   Collection Time: 10/20/21 12:16 AM   Specimen: BLOOD  Result Value Ref Range Status   Specimen Description   Final    BLOOD RIGHT ANTECUBITAL Performed at [facility], [address] [place], [place] [address]    Special Requests   Final    BOTTLES DRAWN AEROBIC AND ANAEROBIC Blood Culture results may not be optimal due to an excessive volume of blood received in culture bottles Performed at [facility], [address] [place], [place] [address]    Culture   Final    NO GROWTH 5 DAYS Performed at [facility], [place] [address] [place], [place] [address]    Report Status 10/25/2021 FINAL  Final  Resp Panel by RT-PCR (Flu A&B, Covid)     Status: None   Collection Time: 10/20/21 12:16 AM   Specimen: Nasopharyngeal(NP) swabs in vial transport medium  Result Value Ref Range Status   SARS Coronavirus 2 by RT PCR NEGATIVE NEGATIVE Final    Comment: (NOTE) SARS-CoV-2 target nucleic acids are NOT DETECTED.  The SARS-CoV-2 RNA is generally detectable in upper respiratory specimens during the acute phase of infection. The lowest concentration of SARS-CoV-2 viral copies this assay can detect is 138 copies/mL. A negative result does not preclude SARS-Cov-2 infection and should not be used as the sole basis for treatment or other patient management decisions. A negative result may occur with  improper specimen collection/handling, submission of specimen other than nasopharyngeal swab, presence of viral mutation(s) within the areas targeted by this assay, and inadequate number of viral copies(<138 copies/mL). A negative result must be combined with clinical observations, patient history, and epidemiological information. The expected result  is Negative.  Fact Sheet for Patients:  EntrepreneurPulse.com.au  Fact Sheet for Healthcare Providers:  IncredibleEmployment.be  This test is no t yet approved or cleared by the [place] FDA and  has been authorized for detection and/or diagnosis of SARS-CoV-2 by FDA under an Emergency Use Authorization (EUA). This EUA will remain  in effect (meaning this test can be used) for the duration of the COVID-19 declaration under Section 564(b)(1) of the Act, 21 U.S.C.section 360bbb-3(b)(1), unless the authorization is terminated  or revoked sooner.       Influenza A by PCR NEGATIVE NEGATIVE Final   Influenza B by PCR NEGATIVE NEGATIVE Final    Comment: (NOTE) The Xpert Xpress SARS-CoV-2/FLU/RSV plus assay is intended as an aid in the diagnosis of influenza from Nasopharyngeal swab specimens and should not be used as a sole basis for treatment. Nasal washings and aspirates are unacceptable for Xpert Xpress SARS-CoV-2/FLU/RSV testing.  Fact Sheet for Patients: EntrepreneurPulse.com.au  Fact Sheet for Healthcare Providers: IncredibleEmployment.be  This test is not yet approved or cleared by the [place] FDA and has been authorized for detection and/or diagnosis of SARS-CoV-2 by FDA under an Emergency Use Authorization (EUA). This EUA will remain in effect (meaning this test can be used) for the duration of the COVID-19 declaration under Section 564(b)(1) of the Act, 21 U.S.C. section 360bbb-3(b)(1), unless the authorization is terminated or revoked.  Performed at [facility], [address], [place], [place] [address]   Urine Culture     Status: None   Collection Time: 10/23/21  5:13 PM   Specimen: Urine, Clean Catch  Result Value Ref Range Status   Specimen Description   Final    URINE, CLEAN CATCH Performed at [facility], [place] [address], [place], [place]  [address]    Special Requests   Final    NONE Performed at [facility], [place] [address], [place], [place] [address]    Culture   Final    NO GROWTH Performed at [facility], [place] [address], [place], [place] [address]    Report Status 10/25/2021 FINAL  Final     RN Pressure Injury Documentation:     Estimated body mass index is 22.83 kg/m as calculated from the following:   Height as of this encounter: '5\' 8"'  (1.727 m).   Weight as of this encounter: 68.1 kg.  Malnutrition Type:   Malnutrition Characteristics:   Nutrition Interventions:     Radiology Studies: US PELVIC COMPLETE WITH TRANSVAGINAL  Result Date: 10/24/2021 CLINICAL DATA:  Abdominal pain, left adnexal mass on CT, prior hysterectomy EXAM: TRANSABDOMINAL AND TRANSVAGINAL ULTRASOUND OF PELVIS TECHNIQUE: Both transabdominal and transvaginal ultrasound examinations of the pelvis were performed. Transabdominal technique was performed for global imaging of the pelvis including uterus, ovaries, adnexal regions, and pelvic cul-de-sac. It was necessary to proceed with endovaginal exam following the transabdominal exam to visualize the bilateral ovaries. COMPARISON:  CT abdomen/pelvis dated 10/19/2021 FINDINGS: Uterus Surgically absent. Right ovary Measurements: 2.1 x 1.7 x 1.8 cm = volume: 3.5 mL. 7.9 x 5.1 x 5.3 cm Left ovary Measurements: Size in 3 dimensions = volume: 111.7 mL. Solid appearance with dirty shadowing, favoring ovarian neoplasm such as ovarian fibroma/fibrothecoma, less likely broad ligament fibroid. Other findings No abnormal free fluid. IMPRESSION: 7.9 cm left adnexal mass, favoring ovarian neoplasm such as ovarian fibroma/fibrothecoma, less likely broad ligament fibroid. GYN surgical consultation is suggested. Electronically Signed   By: Julian Hy M.D.   On: 10/24/2021 14:38    Scheduled Meds:  acidophilus  2 capsule Oral TID   dicyclomine  10 mg Oral TID AC   fluticasone  2  spray Each Nare Daily   miconazole  100 mg Vaginal QHS   nystatin  5 mL Oral QID   vancomycin  125 mg Oral QID   Continuous Infusions:  sodium chloride 75 mL/hr at 10/25/21 0300    LOS: 5 days   Kerney Elbe, DO [facility] PAGER is on [place]  If 7PM-7AM, please contact night-coverage [email or web address]

## 2021-10-26 LAB — CBC WITH DIFFERENTIAL/PLATELET
Abs Immature Granulocytes: 0.05 10*3/uL (ref 0.00–0.07)
Basophils Absolute: 0.1 10*3/uL (ref 0.0–0.1)
Basophils Relative: 1 %
Eosinophils Absolute: 0.2 10*3/uL (ref 0.0–0.5)
Eosinophils Relative: 5 %
HCT: 34.1 % — ABNORMAL LOW (ref 36.0–46.0)
Hemoglobin: 11 g/dL — ABNORMAL LOW (ref 12.0–15.0)
Immature Granulocytes: 1 %
Lymphocytes Relative: 42 %
Lymphs Abs: 1.8 10*3/uL (ref 0.7–4.0)
MCH: 29.7 pg (ref 26.0–34.0)
MCHC: 32.3 g/dL (ref 30.0–36.0)
MCV: 92.2 fL (ref 80.0–100.0)
Monocytes Absolute: 0.7 10*3/uL (ref 0.1–1.0)
Monocytes Relative: 18 %
Neutro Abs: 1.4 10*3/uL — ABNORMAL LOW (ref 1.7–7.7)
Neutrophils Relative %: 33 %
Platelets: 314 10*3/uL (ref 150–400)
RBC: 3.7 MIL/uL — ABNORMAL LOW (ref 3.87–5.11)
RDW: 13.9 % (ref 11.5–15.5)
WBC: 4.1 10*3/uL (ref 4.0–10.5)
nRBC: 0 % (ref 0.0–0.2)

## 2021-10-26 LAB — COMPREHENSIVE METABOLIC PANEL
ALT: 19 U/L (ref 0–44)
AST: 18 U/L (ref 15–41)
Albumin: 3 g/dL — ABNORMAL LOW (ref 3.5–5.0)
Alkaline Phosphatase: 47 U/L (ref 38–126)
Anion gap: 6 (ref 5–15)
BUN: 13 mg/dL (ref 6–20)
CO2: 26 mmol/L (ref 22–32)
Calcium: 8.2 mg/dL — ABNORMAL LOW (ref 8.9–10.3)
Chloride: 108 mmol/L (ref 98–111)
Creatinine, Ser: 0.69 mg/dL (ref 0.44–1.00)
GFR, Estimated: >60 mL/min (ref >60)
Glucose, Bld: 101 mg/dL — ABNORMAL HIGH (ref 70–99)
Potassium: 4.2 mmol/L (ref 3.5–5.1)
Sodium: 140 mmol/L (ref 135–145)
Total Bilirubin: 0.2 mg/dL — ABNORMAL LOW (ref 0.3–1.2)
Total Protein: 5.9 g/dL — ABNORMAL LOW (ref 6.5–8.1)

## 2021-10-26 LAB — PHOSPHORUS: Phosphorus: 3.3 mg/dL (ref 2.5–4.6)

## 2021-10-26 LAB — RETICULOCYTES
Immature Retic Fract: 24.8 % — ABNORMAL HIGH (ref 2.3–15.9)
RBC.: 3.72 MIL/uL — ABNORMAL LOW (ref 3.87–5.11)
Retic Count, Absolute: 64 10*3/uL (ref 19.0–186.0)
Retic Ct Pct: 1.7 % (ref 0.4–3.1)

## 2021-10-26 LAB — IRON AND TIBC
Iron: 42 ug/dL (ref 28–170)
Saturation Ratios: 17 % (ref 10.4–31.8)
TIBC: 243 ug/dL — ABNORMAL LOW (ref 250–450)
UIBC: 201 ug/dL

## 2021-10-26 LAB — CA 125: Cancer Antigen (CA) 125: 12 U/mL (ref 0.0–38.1)

## 2021-10-26 LAB — VITAMIN B12: Vitamin B-12: 209 pg/mL (ref 180–914)

## 2021-10-26 LAB — FOLATE: Folate: 3.6 ng/mL — ABNORMAL LOW (ref >5.9)

## 2021-10-26 LAB — MAGNESIUM: Magnesium: 2.2 mg/dL (ref 1.7–2.4)

## 2021-10-26 LAB — FERRITIN: Ferritin: 71 ng/mL (ref 11–307)

## 2021-10-26 MED ORDER — ZINC OXIDE 20 % EX OINT
TOPICAL_OINTMENT | CUTANEOUS | Status: DC | PRN
  Filled 2021-10-26 (×2): qty 28.35

## 2021-10-26 MED ORDER — FLUCONAZOLE 100 MG PO TABS
100.0000 mg | ORAL_TABLET | Freq: Every day | ORAL | Status: DC
  Administered 2021-10-26 – 2021-10-27 (×2): 100 mg via ORAL
  Filled 2021-10-26 (×2): qty 1

## 2021-10-26 MED ORDER — SODIUM CHLORIDE 0.9 % IV SOLN: INTRAVENOUS | Status: AC

## 2021-10-26 NOTE — Progress Notes (Signed)
PROGRESS NOTE    Jacqueline Hughes  YQM:[id number] DOB: Jan 24, 1966 DOA: 10/19/2021 PCP: Donella Stade, PA-C   Brief Narrative: The patient is a 55 year old Caucasian female with a past medical history significant for but not limited to acquired hypothyroidism, allergic rhinitis, generalized anxiety disorder as well as other comorbidities who presented to the hospital on 10/19/2021 by way of transfer from [facility] ED with sepsis due to C. difficile colitis after he presented from home to the facility complaining of significant diarrhea.  She reports 2 weeks of loose stool that progressively became more frequent over the timeframe the last 3 to 4 days she has had increased frequency resulting in at least 10-15 episodes of loose stool in the absence of any melena or hematochezia.  She does note diffuse crampy abdominal discomfort that worsens with palpation of the abdomen and reports intermittent nausea but no vomiting.  Last 1 to 2 days she is developed a fever with a T-max of 102.  She denies any chills he does not know of any history of inflammatory bowel disease he does not have any history of Crohn's or ulcerative colitis.  She also denies any history of autoimmune related enterocolitis and no history of celiac's disease.  She recently reports that she completed a course of Augmentin approximately a month ago for treatment of acute sinusitis and denies any PPIs.  She came to the ED for these complaints and CT of the abdomen pelvis was done and showed pancolitis that was felt to be consistent with infectious versus inflammatory colitis but no evidence of perforation, obstruction or abscess.  While in the ED she was given Bentyl, Zofran and p.o. vancomycin and admitted to [facility] for the management of sepsis due to C. difficile colitis.  She is now currently still having quite a bit of bowel movements and complaining of some burning and discomfort" lesions in her vaginal area."  Patient refuses for  [place] to examine her and requested gynecological evaluation called the GYN doctor who states that they will be by her on Tuesday.  In the interim they are recommending a transvaginal ultrasound as well as a Ca1 25 tumor marker.  Patient was noted to have a complex appearing 7.9 x 5.8 cm left adnexal lesion so we will work this up further with a transvaginal ultrasound and gynecological evaluation Tuesday.  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  Discussed the case with Gyn Onc who feels that she can be managed by regular gynecology.  Gynecology to see the patient today.  Her diarrhea is improving is slowing down and she had a bowel movements total in last 24 hours and if she has 5 or less she can safely be discharged home.  We will reduce her IV fluid hydration from 75 mL to 50 mL/h for another 12 more hours  Assessment & Plan:   Principal Problem:   C. difficile colitis Active Problems:   Diarrhea   Pancolitis (HCC)   Sepsis ([facility])   Leukocytosis   Acute hyponatremia   Thyroid disease   Vaginal candidiasis  Sepsis secondary to C. difficile colitis, improving -He met sepsis criteria on admission she had WBCs greater than 12,000, heart rate greater than 90, and a temperature greater than 38 C with a source of infection from C. Difficile -CT of the abdomen pelvis done and showed pancolitis consistent with infectious process -C. difficile antigen  toxin was positive -She recently takes Augmentin which increases risk for development of pseudomembranous colitis -We will continue supportive care and patient was started back on maintenance IV fluids at 75 mL but will cut down to 50 MLS per hour for another 12 hours -Continue with vancomycin p.o. 125 mg p.o. 4 times daily for 10 days; today is day 7 of her vancomycin and she is improved -Blood cultures x2 showed no growth to date at 5  days -Last lactic acid level was checked and was 0.8 -Initiated on probiotics with acidophilus 2 capsules p.o. 3 times daily -Continue with dicyclomine 10 mg p.o. 3 times daily before meals -Sepsis physiology is improved and she is no longer febrile and no longer has a WBC as it is 4.2 was a little hypotensive so she was given 500 mL bolus and was continued on maintenance IV fluid at 75 MLS per hour -Patient states that her diarrhea is improving and had 8 bowel movements total in the last 24 hours  Normocytic Anemia -Stable as patient's hemoglobin/hematocrit is relatively stable at 11.0/34.1 and likely a dilutional drop from a few days ago -Anemia panel was checked and showed an iron level of 42, U IBC of 201, TIBC of 243, saturation ratios of 17%, ferritin level 71, folate level 3.6, vitamin B12 209 -Continue to monitor for signs and symptoms of bleeding; currently no overt bleeding noted -Repeat CBC in a.m.  Suspected vaginal candidiasis -She is initiated on miconazole vaginal suppository 100 mg x 7 days -Given that she continues to have burning and discomfort and "lesions" we will check a urinalysis -May need some Diflucan but will evaluate her burning discomfort in her urine first -Initial urinalysis showed a small color urine with moderate hemoglobin, large leukocytes, negative nitrites, rare bacteria, 0-5 RBCs per high-power field, 0-5 squamous epithelial cells, 11-20 WBCs and urine cultures not done we will order repeat urinalysis and urine culture today -He has lesions and will not allow [place] to look at these lesions we have requested a gynecological evaluation and will be done on Tuesday -In the interim we will obtain a transvaginal ultrasound and CA 125 -Gynecology is also ordered her self swabs for HSV as well as BV, trichomonas and Yeast however they do not do them on the floor so gynecology will evaluate the patient today  Hyponatremia -Improved -Sodium level is stable at  140 -Continue with IV fluid hydration with normal saline at 50 MLS per hour for another 12 hours as above -Continue to monitor and trend and repeat CMP in a.m.  Acquired Hypothyroidism -TSH was checked and was 2.586 -Home medications do not reflect the patient being on Synthroid -Free T4 0.96  Left-sided adnexal lesion -Found on CT scan is noted to be complex appearing 7.9 x 5.8 cm left adnexal lesion -We will obtain a CA125 level as well as a pelvic and transvaginal ultrasound -Transvaginal ultrasound done and hows a 7.9 cm left adnexal mass favoring ovarian neoplasm such as an ovarian fibroma/fibrothecoma likely broad ligament fibroid.  Discussed the case with Gyn Onc who feels that she can be managed by regular gynecology. -Have asked gynecology to evaluate and they will see the patient Tuesday, 10/26/2021  Hypokalemia -Patient's K+ is stable at 4.2 -Continue monitor and replete as necessary -Repeat CMP in the AM   Hypomagnesemia -Improved.  Patient's magnesium level is now 2.2 -Continue to monitor and replete as necessary -Repeat CMP in a.m.  DVT prophylaxis: SCDs but will initiate DVT prophylaxis pharmacologically  Code Status: FULL CODE  Family Communication: No family currently at bedside Disposition Plan: Pending further clinical improvement in her diarrhea  Status is: Inpatient  Remains inpatient appropriate because: Anticipating discharge in the next 24 to 40 hours given that her diarrhea is improving and she had 8 bowel movements total in the last 24 hours.  Consultants:  Gynecology Dr. [name] Discussed the case with Dr. Berline Lopes  Procedures:  None  Antimicrobials:  Anti-infectives (From admission, onward)    Start     Dose/Rate Route Frequency Ordered Stop   10/26/21 1200  fluconazole (DIFLUCAN) tablet 100 mg        100 mg Oral Daily 10/26/21 1113 11/02/21 0959   10/20/21 1000  vancomycin (VANCOCIN) capsule 125 mg        125 mg Oral 4 times daily 10/20/21 0031  10/30/21 0959        Subjective: Seen and examined at bedside and thinks that her diarrhea is improving and had 8 bowel movements total last 24 hours.  No chest pain or shortness breath and thinks her crampy abdominal pain is improving.  Awaiting gynecology evaluation.  No chest pain or shortness breath.  No other concerns or complaints at this time.  Objective: Vitals:   10/25/21 1401 10/25/21 2151 10/26/21 0500 10/26/21 0626  BP: 91/63 106/71  94/64  Pulse: 92 81  71  Resp: _0 Temp: 98.1 F (36.7 C) 98.4 F (36.9 C)  98.2 F (36.8 C)  TempSrc: Oral Oral  Oral  SpO2: 97% 98%  96%  Weight:   68.4 kg   Height:        Intake/Output Summary (Last 24 hours) at 10/26/2021 1320 Last data filed at 10/26/2021 0600 Gross per 24 hour  Intake 1998.79 ml  Output --  Net 1998.79 ml    Filed Weights   10/19/21 1844 10/25/21 0700 10/26/21 0500  Weight: 67.1 kg 68.1 kg 68.4 kg   Examination: Physical Exam:  Constitutional: WN/WD thin Caucasian female currently no acute distress sitting in the chair bedside Eyes: Lids and conjunctivae normal, sclerae anicteric  ENMT: External Ears, Nose appear normal. Grossly normal hearing.  Neck: Appears normal, supple, no cervical masses, normal ROM, no appreciable thyromegaly; no appreciable JVD Respiratory: Diminished to auscultation bilaterally, no wheezing, rales, rhonchi or crackles. Normal respiratory effort and patient is not tachypenic. No accessory muscle use.  Unlabored breathing and not wearing supplemental oxygen nasal cannula Cardiovascular: RRR, no murmurs / rubs / gallops. S1 and S2 auscultated.  No appreciable extremity edema Abdomen: Soft, non-tender, non-distended. Bowel sounds positive.  GU: Deferred. Musculoskeletal: No clubbing / cyanosis of digits/nails. No joint deformity upper and lower extremities.  Skin: No rashes, lesions, ulcers on limited skin evaluation. No induration; Warm and dry.  Neurologic: CN 2-12 grossly  intact with no focal deficits. Romberg sign and cerebellar reflexes not assessed.  Psychiatric: Normal judgment and insight. Alert and oriented x 3. Normal mood and appropriate affect.   Data Reviewed: I have personally reviewed following labs and imaging studies  CBC: Recent Labs  Lab 10/22/21 0505 10/23/21 0435 10/24/21 0504 10/25/21 0427 10/26/21 0421  WBC 4.7 5.1 5.0 4.2 4.1  NEUTROABS 2.2 2.3 2.0 1.3* 1.4*  HGB 11.1* 12.1 10.9* 11.3* 11.0*  HCT 33.5* 36.7 33.1* 34.0* 34.1*  MCV 91.0 90.2 91.2 91.4 92.2  PLT 241 295 274 316 361    Basic Metabolic Panel: Recent Labs  Lab 10/22/21 0505 10/23/21 0435 10/24/21 0504 10/25/21 0427 10/26/21  0421  NA 141 141 140 139 140  K 3.9 4.0 3.8 3.6 4.2  CL 107 106 108 108 108  CO2 _0 GLUCOSE 92 97 99 90 101*  BUN <5* <5* _1 CREATININE 0.52 0.59 0.82 0.48 0.69  CALCIUM 8.4* 8.5* 8.1* 8.1* 8.2*  MG 2.1 2.0 1.8 1.8 2.2  PHOS 2.8 4.0 4.2 3.7 3.3    GFR: Estimated Creatinine Clearance: 81.1 mL/min (by C-G formula based on SCr of 0.69 mg/dL). Liver Function Tests: Recent Labs  Lab 10/22/21 0505 10/23/21 0435 10/24/21 0504 10/25/21 0427 10/26/21 0421  AST _2 14* 18  ALT _3 ALKPHOS 34* 43 43 47 47  BILITOT 0.5 0.4 0.6 0.3 0.2*  PROT 5.8* 6.3* 5.6* 5.8* 5.9*  ALBUMIN 3.0* 3.3* 2.8* 3.0* 3.0*    Recent Labs  Lab 10/19/21 2112  LIPASE 22    No results for input(s): AMMONIA in the last 168 hours. Coagulation Profile: No results for input(s): INR, PROTIME in the last 168 hours. Cardiac Enzymes: No results for input(s): CKTOTAL, CKMB, CKMBINDEX, TROPONINI in the last 168 hours. BNP (last 3 results) No results for input(s): PROBNP in the last 8760 hours. HbA1C: No results for input(s): HGBA1C in the last 72 hours. CBG: No results for input(s): GLUCAP in the last 168 hours. Lipid Profile: No results for input(s): CHOL, HDL, LDLCALC, TRIG, CHOLHDL, LDLDIRECT in the last 72  hours. Thyroid Function Tests: No results for input(s): TSH, T4TOTAL, FREET4, T3FREE, THYROIDAB in the last 72 hours.  Anemia Panel: Recent Labs    10/26/21 0421  VITAMINB12 209  FOLATE 3.6*  FERRITIN 71  TIBC 243*  IRON 42  RETICCTPCT 1.7   Sepsis Labs: Recent Labs  Lab 10/20/21 0446  LATICACIDVEN 0.8     Recent Results (from the past 240 hour(s))  C Difficile Quick Screen w PCR reflex     Status: Abnormal   Collection Time: 10/19/21  8:50 PM   Specimen: STOOL  Result Value Ref Range Status   C Diff antigen POSITIVE (A) NEGATIVE Final    Comment: CRITICAL RESULT CALLED TO, READ BACK BY AND VERIFIED WITH:  KELLY NEAL RN 10/20/2021 _4  BY JW    C Diff toxin POSITIVE (A) NEGATIVE Final   C Diff interpretation Toxin producing C. difficile detected.  Final    Comment: Performed at [facility], [place] [address], [place], [place] [address]  Gastrointestinal Panel by PCR , Stool     Status: None   Collection Time: 10/19/21  8:50 PM   Specimen: STOOL  Result Value Ref Range Status   Campylobacter species NOT DETECTED NOT DETECTED Final   Plesimonas shigelloides NOT DETECTED NOT DETECTED Final   Salmonella species NOT DETECTED NOT DETECTED Final   Yersinia enterocolitica NOT DETECTED NOT DETECTED Final   Vibrio species NOT DETECTED NOT DETECTED Final   Vibrio cholerae NOT DETECTED NOT DETECTED Final   Enteroaggregative E coli (EAEC) NOT DETECTED NOT DETECTED Final   Enteropathogenic E coli (EPEC) NOT DETECTED NOT DETECTED Final   Enterotoxigenic E coli (ETEC) NOT DETECTED NOT DETECTED Final   Shiga like toxin producing E coli (STEC) NOT DETECTED NOT DETECTED Final   Shigella/Enteroinvasive E coli (EIEC) NOT DETECTED NOT DETECTED Final   Cryptosporidium NOT DETECTED NOT DETECTED Final   Cyclospora cayetanensis NOT DETECTED NOT DETECTED Final   Entamoeba histolytica NOT DETECTED NOT DETECTED Final   Giardia lamblia NOT  DETECTED NOT DETECTED Final   Adenovirus F40/41  NOT DETECTED NOT DETECTED Final   Astrovirus NOT DETECTED NOT DETECTED Final   Norovirus GI/GII NOT DETECTED NOT DETECTED Final   Rotavirus A NOT DETECTED NOT DETECTED Final   Sapovirus (I, II, IV, and V) NOT DETECTED NOT DETECTED Final    Comment: Performed at [facility], [address] [place], [place] [address]  Culture, blood (routine x 2)     Status: None   Collection Time: 10/20/21 12:10 AM   Specimen: BLOOD  Result Value Ref Range Status   Specimen Description   Final    BLOOD LEFT ANTECUBITAL Performed at [facility], [address] [place], [place] [address]    Special Requests   Final    BOTTLES DRAWN AEROBIC AND ANAEROBIC Blood Culture adequate volume Performed at [facility], [address] [place], [place] [address]    Culture   Final    NO GROWTH 5 DAYS Performed at [facility], [place] [address], [place], [place] [address]    Report Status 10/25/2021 FINAL  Final  Culture, blood (routine x 2)     Status: None   Collection Time: 10/20/21 12:16 AM   Specimen: BLOOD  Result Value Ref Range Status   Specimen Description   Final    BLOOD RIGHT ANTECUBITAL Performed at [facility], [address] [place], [place] [address]    Special Requests   Final    BOTTLES DRAWN AEROBIC AND ANAEROBIC Blood Culture results may not be optimal due to an excessive volume of blood received in culture bottles Performed at [facility], [address] [place], [place] [address]    Culture   Final    NO GROWTH 5 DAYS Performed at [facility], [place] [address] [place], [place] [address]    Report Status 10/25/2021 FINAL  Final  Resp Panel by RT-PCR (Flu A&B, Covid)     Status: None   Collection Time: 10/20/21 12:16 AM   Specimen: Nasopharyngeal(NP) swabs in vial transport medium  Result Value Ref Range Status   SARS Coronavirus 2 by RT PCR NEGATIVE NEGATIVE Final    Comment: (NOTE) SARS-CoV-2 target nucleic  acids are NOT DETECTED.  The SARS-CoV-2 RNA is generally detectable in upper respiratory specimens during the acute phase of infection. The lowest concentration of SARS-CoV-2 viral copies this assay can detect is 138 copies/mL. A negative result does not preclude SARS-Cov-2 infection and should not be used as the sole basis for treatment or other patient management decisions. A negative result may occur with  improper specimen collection/handling, submission of specimen other than nasopharyngeal swab, presence of viral mutation(s) within the areas targeted by this assay, and inadequate number of viral copies(<138 copies/mL). A negative result must be combined with clinical observations, patient history, and epidemiological information. The expected result is Negative.  Fact Sheet for Patients:  EntrepreneurPulse.com.au  Fact Sheet for Healthcare Providers:  IncredibleEmployment.be  This test is no t yet approved or cleared by the [place] FDA and  has been authorized for detection and/or diagnosis of SARS-CoV-2 by FDA under an Emergency Use Authorization (EUA). This EUA will remain  in effect (meaning this test can be used) for the duration of the COVID-19 declaration under Section 564(b)(1) of the Act, 21 U.S.C.section 360bbb-3(b)(1), unless the authorization is terminated  or revoked sooner.       Influenza A by  PCR NEGATIVE NEGATIVE Final   Influenza B by PCR NEGATIVE NEGATIVE Final    Comment: (NOTE) The Xpert Xpress SARS-CoV-2/FLU/RSV plus assay is intended as an aid in the diagnosis of influenza from Nasopharyngeal swab specimens and should not be used as a sole basis for treatment. Nasal washings and aspirates are unacceptable for Xpert Xpress SARS-CoV-2/FLU/RSV testing.  Fact Sheet for Patients: EntrepreneurPulse.com.au  Fact Sheet for Healthcare Providers: IncredibleEmployment.be  This  test is not yet approved or cleared by the [place] FDA and has been authorized for detection and/or diagnosis of SARS-CoV-2 by FDA under an Emergency Use Authorization (EUA). This EUA will remain in effect (meaning this test can be used) for the duration of the COVID-19 declaration under Section 564(b)(1) of the Act, 21 U.S.C. section 360bbb-3(b)(1), unless the authorization is terminated or revoked.  Performed at [facility], [address] [place], [place] [address]   Urine Culture     Status: None   Collection Time: 10/23/21  5:13 PM   Specimen: Urine, Clean Catch  Result Value Ref Range Status   Specimen Description   Final    URINE, CLEAN CATCH Performed at [facility], [place] [address], [place], [place] [address]    Special Requests   Final    NONE Performed at [facility], [place] [address], [place], [place] [address]    Culture   Final    NO GROWTH Performed at [facility], [place] [address] [place], [place] [address]    Report Status 10/25/2021 FINAL  Final     RN Pressure Injury Documentation:     Estimated body mass index is 22.93 kg/m as calculated from the following:   Height as of this encounter: $RemoveBeforeD'5\' 8"'[id number]$  (1.727 m).   Weight as of this encounter: 68.4 kg.  Malnutrition Type:   Malnutrition Characteristics:   Nutrition Interventions:     Radiology Studies: US PELVIC COMPLETE WITH TRANSVAGINAL  Result Date: 10/24/2021 CLINICAL DATA:  Abdominal pain, left adnexal mass on CT, prior hysterectomy EXAM: TRANSABDOMINAL AND TRANSVAGINAL ULTRASOUND OF PELVIS TECHNIQUE: Both transabdominal and transvaginal ultrasound examinations of the pelvis were performed. Transabdominal technique was performed for global imaging of the pelvis including uterus, ovaries, adnexal regions, and pelvic cul-de-sac. It was necessary to proceed with endovaginal exam following the transabdominal exam to visualize the bilateral  ovaries. COMPARISON:  CT abdomen/pelvis dated 10/19/2021 FINDINGS: Uterus Surgically absent. Right ovary Measurements: 2.1 x 1.7 x 1.8 cm = volume: 3.5 mL. 7.9 x 5.1 x 5.3 cm Left ovary Measurements: Size in 3 dimensions = volume: 111.7 mL. Solid appearance with dirty shadowing, favoring ovarian neoplasm such as ovarian fibroma/fibrothecoma, less likely broad ligament fibroid. Other findings No abnormal free fluid. IMPRESSION: 7.9 cm left adnexal mass, favoring ovarian neoplasm such as ovarian fibroma/fibrothecoma, less likely broad ligament fibroid. GYN surgical consultation is suggested. Electronically Signed   By: Julian Hy M.D.   On: 10/24/2021 14:38    Scheduled Meds:  acidophilus  2 capsule Oral TID   dicyclomine  10 mg Oral TID AC   fluconazole  100 mg Oral Daily   fluticasone  2 spray Each Nare Daily   miconazole  100 mg Vaginal QHS   nystatin  5 mL Oral QID   vancomycin  125 mg Oral QID   Continuous Infusions:  sodium chloride 50 mL/hr at 10/26/21 1014    LOS: 6 days   Kerney Elbe, DO [facility] PAGER is on  AMION  If 7PM-7AM, please contact night-coverage [email or web address]

## 2021-10-27 DIAGNOSIS — R109 Unspecified abdominal pain: Secondary | ICD-10-CM

## 2021-10-27 LAB — COMPREHENSIVE METABOLIC PANEL
ALT: 18 U/L (ref 0–44)
AST: 18 U/L (ref 15–41)
Albumin: 3 g/dL — ABNORMAL LOW (ref 3.5–5.0)
Alkaline Phosphatase: 43 U/L (ref 38–126)
Anion gap: 6 (ref 5–15)
BUN: 11 mg/dL (ref 6–20)
CO2: 25 mmol/L (ref 22–32)
Calcium: 8.3 mg/dL — ABNORMAL LOW (ref 8.9–10.3)
Chloride: 108 mmol/L (ref 98–111)
Creatinine, Ser: 0.58 mg/dL (ref 0.44–1.00)
GFR, Estimated: >60 mL/min (ref >60)
Glucose, Bld: 95 mg/dL (ref 70–99)
Potassium: 3.8 mmol/L (ref 3.5–5.1)
Sodium: 139 mmol/L (ref 135–145)
Total Bilirubin: 0.2 mg/dL — ABNORMAL LOW (ref 0.3–1.2)
Total Protein: 5.9 g/dL — ABNORMAL LOW (ref 6.5–8.1)

## 2021-10-27 LAB — PHOSPHORUS: Phosphorus: 4.3 mg/dL (ref 2.5–4.6)

## 2021-10-27 LAB — CBC WITH DIFFERENTIAL/PLATELET
Abs Immature Granulocytes: 0.07 10*3/uL (ref 0.00–0.07)
Basophils Absolute: 0.1 10*3/uL (ref 0.0–0.1)
Basophils Relative: 1 %
Eosinophils Absolute: 0.2 10*3/uL (ref 0.0–0.5)
Eosinophils Relative: 5 %
HCT: 35.1 % — ABNORMAL LOW (ref 36.0–46.0)
Hemoglobin: 11.4 g/dL — ABNORMAL LOW (ref 12.0–15.0)
Immature Granulocytes: 2 %
Lymphocytes Relative: 37 %
Lymphs Abs: 1.8 10*3/uL (ref 0.7–4.0)
MCH: 29.9 pg (ref 26.0–34.0)
MCHC: 32.5 g/dL (ref 30.0–36.0)
MCV: 92.1 fL (ref 80.0–100.0)
Monocytes Absolute: 0.6 10*3/uL (ref 0.1–1.0)
Monocytes Relative: 12 %
Neutro Abs: 2.1 10*3/uL (ref 1.7–7.7)
Neutrophils Relative %: 43 %
Platelets: 346 10*3/uL (ref 150–400)
RBC: 3.81 MIL/uL — ABNORMAL LOW (ref 3.87–5.11)
RDW: 14.1 % (ref 11.5–15.5)
WBC: 4.8 10*3/uL (ref 4.0–10.5)
nRBC: 0 % (ref 0.0–0.2)

## 2021-10-27 LAB — MAGNESIUM: Magnesium: 2.2 mg/dL (ref 1.7–2.4)

## 2021-10-27 MED ORDER — VANCOMYCIN HCL 125 MG PO CAPS
125.0000 mg | ORAL_CAPSULE | Freq: Four times a day (QID) | ORAL | 0 refills | Status: AC
Start: 2021-10-27 — End: 2021-10-31

## 2021-10-27 MED ORDER — FAMOTIDINE 20 MG PO TABS: 20.0000 mg | ORAL_TABLET | Freq: Every day | ORAL | 0 refills | Status: DC

## 2021-10-27 MED ORDER — FLUCONAZOLE 100 MG PO TABS: 100.0000 mg | ORAL_TABLET | Freq: Every day | ORAL | 0 refills | Status: DC

## 2021-10-27 MED ORDER — DICYCLOMINE HCL 10 MG PO CAPS: 10.0000 mg | ORAL_CAPSULE | Freq: Three times a day (TID) | ORAL | 0 refills | Status: DC | PRN

## 2021-10-27 MED ORDER — FAMOTIDINE 20 MG PO TABS
20.0000 mg | ORAL_TABLET | Freq: Every day | ORAL | Status: DC
  Administered 2021-10-27: 12:00:00 20 mg via ORAL
  Filled 2021-10-27: qty 1

## 2021-10-27 NOTE — Progress Notes (Signed)
Assessment unchanged. Pt verbalized understanding of dc Instructions including medications to resume and follow up care. Discharged via foot per pt request. Accompanied by Husband and NT to front entrance.

## 2021-10-27 NOTE — Plan of Care (Signed)

## 2021-10-27 NOTE — Consult Note (Signed)
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

## 2021-10-28 ENCOUNTER — Other Ambulatory Visit: Payer: Self-pay

## 2021-10-28 ENCOUNTER — Ambulatory Visit: Payer: No Typology Code available for payment source

## 2021-10-28 DIAGNOSIS — A0472 Enterocolitis due to Clostridium difficile, not specified as recurrent: Secondary | ICD-10-CM

## 2021-11-01 NOTE — Discharge Summary (Signed)
Physician Discharge Summary  Jacqueline Hughes NIO:[id number] DOB: August 02, 1966 DOA: 10/19/2021  PCP: Donella Stade, PA-C  Admit date: 10/19/2021 Discharge date: 10/27/21  Admitted From: Home. Disposition:  Home  Recommendations for Outpatient Follow-up:  Follow up with PCP in 1-2 weeks Please obtain BMP/CBC in one week Please follow up with ob/ gyn, as recommended.    Discharge Condition:stable. CODE STATUS:Full code Diet recommendation: Heart Healthy  Brief/Interim Summary: The patient is a 56 year old Caucasian female with a past medical history significant for but not limited to acquired hypothyroidism, allergic rhinitis, generalized anxiety disorder as well as other comorbidities who presented to the hospital on 10/19/2021 by way of transfer from [facility] ED with sepsis due to C. difficile colitis after he presented from home to the facility complaining of significant diarrhea.  She reports 2 weeks of loose stool that progressively became more frequent over the timeframe the last 3 to 4 days she has had increased frequency resulting in at least 10-15 episodes of loose stool in the absence of any melena or hematochezia.  She does note diffuse crampy abdominal discomfort that worsens with palpation of the abdomen and reports intermittent nausea but no vomiting.  Last 1 to 2 days she is developed a fever with a T-max of 102.  She denies any chills he does not know of any history of inflammatory bowel disease he does not have any history of Crohn's or ulcerative colitis.  She also denies any history of autoimmune related enterocolitis and no history of celiac's disease.  She recently reports that she completed a course of Augmentin approximately a month ago for treatment of acute sinusitis and denies any PPIs.  She came to the ED for these complaints and CT of the abdomen pelvis was done and showed pancolitis that was felt to be consistent with infectious versus inflammatory colitis but  no evidence of perforation, obstruction or abscess.  While in the ED she was given Bentyl, Zofran and p.o. vancomycin and admitted to [facility] for the management of sepsis due to C. difficile colitis.  She is now currently still having quite a bit of bowel movements and complaining of some burning and discomfort" lesions in her vaginal area."  Patient refuses for [place] to examine her and requested gynecological evaluation called the GYN doctor who states that they will be by her on Tuesday.  In the interim they are recommending a transvaginal ultrasound as well as a Ca1 25 tumor marker.  Patient was noted to have a complex appearing 7.9 x 5.8 cm left adnexal lesion so we will work this up further with a transvaginal ultrasound and gynecological evaluation Tuesday.  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  Discussed the case with Gyn Onc who feels that she can be managed by regular gynecology.     Her diarrhea is improving is slowing down and she had a bowel movements total in last 24 hours   Discharge Diagnoses:  Principal Problem:   C. difficile colitis Active Problems:   Diarrhea   Pancolitis ([place])   Sepsis ([place])   Leukocytosis   Acute hyponatremia   Thyroid disease   Vaginal candidiasis  Sepsis secondary to C. difficile colitis, improving -He met sepsis criteria on admission she had WBCs greater than 12,000, heart rate greater than 90, and a temperature greater than 38 C with a source of infection from C. Difficile -CT of  the abdomen pelvis done and showed pancolitis consistent with infectious process -C. difficile antigen toxin was positive -She recently takes Augmentin which increases risk for development of pseudomembranous colitis -Continue with vancomycin on discharge to complete the course.  -Blood cultures x2 showed no growth to date at 5 days -Last lactic acid level was  checked and was 0.8 -Sepsis physiology is improved and she is no longer febrile and no longer has a WBC as it is 4.2    Normocytic Anemia -Stable as patient's hemoglobin/hematocrit is relatively stable at 11.0/34.1 and likely a dilutional drop from a few days ago -Anemia panel was checked and showed an iron level of 42, U IBC of 201, TIBC of 243, saturation ratios of 17%, ferritin level 71, folate level 3.6, vitamin B12 209 -Continue to monitor for signs and symptoms of bleeding; currently no overt bleeding noted  Suspected vaginal candidiasis - continue with diflucan to complete the course. Ob/gyn consulted and recommendations given.  -Recommend outpatient follow up with ob/gyn.     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  Discussed the case with Gyn Onc who feels that she can be managed by regular gynecology. Recommend outpatient follow up .    Hypokalemia -Patient's K+ is stable at 4.2  Hypomagnesemia -Improved.  Patient's magnesium level is now 2.2 -Continue to monitor and replete as necessary     Discharge Instructions  Discharge Instructions     Diet - low sodium heart healthy   Complete by: As directed    Discharge instructions   Complete by: As directed    PLEASE follow up with OB/GYN as recommended.  Please follow up with your PCP in one week.      Allergies as of 10/27/2021       Reactions   E-mycin [erythromycin]    Lexapro [escitalopram Oxalate]     Confused and disoriented.    Lactose Intolerance (gi)    Covid-19 Mrna Vaccine (pfizer) [covid-19 Mrna Vacc (moderna)] Rash   Started on prednisone last night- vaccine on Saturday        Medication List     STOP taking these medications    amphetamine-dextroamphetamine 15 MG 24 hr capsule Commonly known as: Adderall XR   doxycycline 100 MG tablet Commonly known as: VIBRA-TABS   fluticasone 50 MCG/ACT nasal spray Commonly known as: Flonase       TAKE these medications    acetaminophen 500 MG tablet Commonly known as: TYLENOL Take 500 mg by mouth every 6 (six) hours as needed for mild pain, fever or headache.   ALPRAZolam 0.5 MG tablet Commonly known as: Xanax Take 1 tablet (0.5 mg total) by mouth at bedtime as needed for anxiety.   dicyclomine 10 MG capsule Commonly known as: BENTYL Take 1 capsule (10 mg total) by mouth 3 (three) times daily with meals as needed for spasms.   famotidine 20 MG tablet Commonly known as: PEPCID Take 1 tablet (20 mg total) by mouth daily.   fluconazole 100 MG tablet  Commonly known as: DIFLUCAN Take 1 tablet (100 mg total) by mouth daily for 6 days.   FLUoxetine 20 MG capsule Commonly known as: PROZAC Take 1 capsule (20 mg total) by mouth daily.   guaiFENesin 100 MG/5ML liquid Commonly known as: ROBITUSSIN Take 5 mLs by mouth every 4 (four) hours as needed for cough or to loosen phlegm.   guaiFENesin 600 MG 12 hr tablet Commonly known as: MUCINEX Take 600 mg by mouth 2 (two) times daily as needed for cough.   ondansetron 4 MG tablet Commonly known as: Zofran Take 1 tablet (4 mg total) by mouth every 8 (eight) hours as needed for nausea or vomiting.   Vitamin D (Ergocalciferol) 1.25 MG (50000 UNIT) Caps capsule Commonly known as: DRISDOL Take 1 capsule (50,000 Units total) by mouth every 7 (seven) days.       ASK your doctor about these medications    vancomycin 125 MG capsule Commonly known as: VANCOCIN Take 1  capsule (125 mg total) by mouth 4 (four) times daily for 4 days. Ask about: Should I take this medication?        Allergies  Allergen Reactions   E-Mycin [Erythromycin]    Lexapro [Escitalopram Oxalate]     Confused and disoriented.    Lactose Intolerance (Gi)    Covid-19 Mrna Vaccine [employer]) [Covid-19 Mrna Vacc (Moderna)] Rash    Started on prednisone last night- vaccine on Saturday    Consultations: Ob/gyn.    Procedures/Studies: CT ABDOMEN PELVIS W CONTRAST  Result Date: 10/19/2021 CLINICAL DATA:  Diarrhea Abdominal pain, acute, nonlocalized EXAM: CT ABDOMEN AND PELVIS WITH CONTRAST TECHNIQUE: Multidetector CT imaging of the abdomen and pelvis was performed using the standard protocol following bolus administration of intravenous contrast. CONTRAST:  120m OMNIPAQUE IOHEXOL 300 MG/ML  SOLN COMPARISON:  None. FINDINGS: Lower chest: No acute abnormality. Bilateral breast implants are noted partially. Hepatobiliary: No focal liver abnormality. No gallstones, gallbladder wall thickening, or pericholecystic fluid. No biliary dilatation. Pancreas: No focal lesion. Normal pancreatic contour. No surrounding inflammatory changes. No main pancreatic ductal dilatation. Spleen: Normal in size without focal abnormality. Adrenals/Urinary Tract: No adrenal nodule bilaterally. Bilateral kidneys enhance symmetrically. No hydronephrosis. No hydroureter. The urinary bladder is unremarkable. On delayed imaging, there is no urothelial wall thickening and there are no filling defects in the opacified portions of the bilateral collecting systems or ureters. Stomach/Bowel: Stomach is within normal limits. No evidence of small bowel wall thickening or dilatation. The colon demonstrates bowel wall thickening, mucosal hyperemia, pericolonic fat stranding. Appendix appears normal. Vascular/Lymphatic: No abdominal aorta or iliac aneurysm. No abdominal, pelvic, or inguinal lymphadenopathy. Reproductive: Complex  appearing 7.9 x 5.8 cm left adnexal lesion. The right adnexa/ovary is grossly unremarkable. Status post hysterectomy. Other: Trace simple free fluid ascites. No intraperitoneal free gas. No organized fluid collection. Musculoskeletal: No abdominal wall hernia or abnormality. No suspicious lytic or blastic osseous lesions. No acute displaced fracture. Multilevel degenerative changes of the spine. IMPRESSION: 1. Pancolitis. Differential diagnosis for etiology includes infection or inflammation. No associated bowel perforation or obstruction. 2. Complex appearing 7.9 x 5.8 cm left adnexal lesion. Recommend pelvic ultrasound and gynecologic consultation. 3. Status post hysterectomy. 4. Trace simple free fluid ascites. Electronically Signed   By: MIven FinnM.D.   On: 10/19/2021 23:06   [place] COMPLETE WITH TRANSVAGINAL  Result Date: 10/24/2021 CLINICAL DATA:  Abdominal pain, left adnexal mass on CT, prior hysterectomy EXAM: TRANSABDOMINAL AND TRANSVAGINAL ULTRASOUND OF PELVIS TECHNIQUE: Both transabdominal and transvaginal ultrasound  examinations of the pelvis were performed. Transabdominal technique was performed for global imaging of the pelvis including uterus, ovaries, adnexal regions, and pelvic cul-de-sac. It was necessary to proceed with endovaginal exam following the transabdominal exam to visualize the bilateral ovaries. COMPARISON:  CT abdomen/pelvis dated 10/19/2021 FINDINGS: Uterus Surgically absent. Right ovary Measurements: 2.1 x 1.7 x 1.8 cm = volume: 3.5 mL. 7.9 x 5.1 x 5.3 cm Left ovary Measurements: Size in 3 dimensions = volume: 111.7 mL. Solid appearance with dirty shadowing, favoring ovarian neoplasm such as ovarian fibroma/fibrothecoma, less likely broad ligament fibroid. Other findings No abnormal free fluid. IMPRESSION: 7.9 cm left adnexal mass, favoring ovarian neoplasm such as ovarian fibroma/fibrothecoma, less likely broad ligament fibroid. GYN surgical consultation is suggested.  Electronically Signed   By: Julian Hy M.D.   On: 10/24/2021 14:38      Subjective: No new complaints.  Discharge Exam: Vitals:   10/26/21 2155 10/27/21 0545  BP: 96/64 95/62  Pulse: 72 62  Resp: 15 14  Temp: 98.2 F (36.8 C) 97.9 F (36.6 C)  SpO2: 97% 98%   Vitals:   10/26/21 0626 10/26/21 1424 10/26/21 2155 10/27/21 0545  BP: 94/64 1'01/66 96/64 95/62 '  Pulse: 71 86 72 62  Resp: '18 16 15 14  ' Temp: 98.2 F (36.8 C) 97.8 F (36.6 C) 98.2 F (36.8 C) 97.9 F (36.6 C)  TempSrc: Oral Oral Oral Oral  SpO2: 96% 99% 97% 98%  Weight:      Height:        General: Pt is alert, awake, not in acute distress Cardiovascular: RRR, S1/S2 +, no rubs, no gallops Respiratory: CTA bilaterally, no wheezing, no rhonchi Abdominal: Soft, NT, ND, bowel sounds + Extremities: no edema, no cyanosis    The results of significant diagnostics from this hospitalization (including imaging, microbiology, ancillary and laboratory) are listed below for reference.     Microbiology: Recent Results (from the past 240 hour(s))  Urine Culture     Status: None   Collection Time: 10/23/21  5:13 PM   Specimen: Urine, Clean Catch  Result Value Ref Range Status   Specimen Description   Final    URINE, CLEAN CATCH Performed at [facility], [place] [address] [place], [place] [address]    Special Requests   Final    NONE Performed at [facility], [place] [address], [place], [place] [address]    Culture   Final    NO GROWTH Performed at [facility], [place] [address], [place], [place] [address]    Report Status 10/25/2021 FINAL  Final     Labs: BNP (last 3 results) No results for input(s): BNP in the last 8760 hours. Basic Metabolic Panel: Recent Labs  Lab 10/26/21 0421 10/27/21 0419  NA 140 139  K 4.2 3.8  CL 108 108  CO2 26 25  GLUCOSE 101* 95  BUN 13 11  CREATININE 0.69 0.58  CALCIUM 8.2* 8.3*  MG 2.2 2.2  PHOS 3.3 4.3   Liver  Function Tests: Recent Labs  Lab 10/26/21 0421 10/27/21 0419  AST 18 18  ALT 19 18  ALKPHOS 47 43  BILITOT 0.2* 0.2*  PROT 5.9* 5.9*  ALBUMIN 3.0* 3.0*   No results for input(s): LIPASE, AMYLASE in the last 168 hours. No results for input(s): AMMONIA in the last 168 hours. CBC: Recent Labs  Lab 10/26/21 0421 10/27/21 0419  WBC 4.1 4.8  NEUTROABS 1.4* 2.1  HGB 11.0* 11.4*  HCT 34.1* 35.1*  MCV 92.2 92.1  PLT 314 346   Cardiac Enzymes: No results for input(s): CKTOTAL, CKMB, CKMBINDEX, TROPONINI in the last 168 hours. BNP: Invalid input(s): POCBNP CBG: No results for input(s): GLUCAP in the last 168 hours. D-Dimer No results for input(s): DDIMER in the last 72 hours. Hgb A1c No results for input(s): HGBA1C in the last 72 hours. Lipid Profile No results for input(s): CHOL, HDL, LDLCALC, TRIG, CHOLHDL, LDLDIRECT in the last 72 hours. Thyroid function studies No results for input(s): TSH, T4TOTAL, T3FREE, THYROIDAB in the last 72 hours.  Invalid input(s): FREET3 Anemia work up No results for input(s): VITAMINB12, FOLATE, FERRITIN, TIBC, IRON, RETICCTPCT in the last 72 hours. Urinalysis    Component Value Date/Time   COLORURINE YELLOW 10/23/2021 1713   APPEARANCEUR CLEAR 10/23/2021 1713   LABSPEC 1.020 10/23/2021 1713   PHURINE 6.0 10/23/2021 1713   GLUCOSEU NEGATIVE 10/23/2021 1713   HGBUR NEGATIVE 10/23/2021 1713   BILIRUBINUR NEGATIVE 10/23/2021 1713   KETONESUR NEGATIVE 10/23/2021 1713   PROTEINUR 30 (A) 10/23/2021 1713   NITRITE NEGATIVE 10/23/2021 1713   LEUKOCYTESUR NEGATIVE 10/23/2021 1713   Sepsis Labs Invalid input(s): PROCALCITONIN,  WBC,  LACTICIDVEN Microbiology Recent Results (from the past 240 hour(s))  Urine Culture     Status: None   Collection Time: 10/23/21  5:13 PM   Specimen: Urine, Clean Catch  Result Value Ref Range Status   Specimen Description   Final    URINE, CLEAN CATCH Performed at [facility], [place]  [address] [place], [place] [address]    Special Requests   Final    NONE Performed at [facility], [place] [address] [place], [place] [address]    Culture   Final    NO GROWTH Performed at [facility], [place] [address] [place], [place] [address]    Report Status 10/25/2021 FINAL  Final     Time coordinating discharge:39 minutes  SIGNED:   Hosie Poisson, MD  [facility]

## 2021-11-03 ENCOUNTER — Other Ambulatory Visit: Payer: Self-pay

## 2021-11-03 ENCOUNTER — Ambulatory Visit (INDEPENDENT_AMBULATORY_CARE_PROVIDER_SITE_OTHER): Payer: BC Managed Care – PPO | Admitting: Physician Assistant

## 2021-11-03 ENCOUNTER — Encounter: Payer: Self-pay | Admitting: Physician Assistant

## 2021-11-03 VITALS — BP 115/75 | HR 80 | Ht 68.0 in | Wt 145.0 lb

## 2021-11-03 DIAGNOSIS — N838 Other noninflammatory disorders of ovary, fallopian tube and broad ligament: Secondary | ICD-10-CM | POA: Diagnosis not present

## 2021-11-03 DIAGNOSIS — M545 Low back pain, unspecified: Secondary | ICD-10-CM | POA: Diagnosis not present

## 2021-11-03 DIAGNOSIS — A498 Other bacterial infections of unspecified site: Secondary | ICD-10-CM

## 2021-11-03 DIAGNOSIS — E871 Hypo-osmolality and hyponatremia: Secondary | ICD-10-CM

## 2021-11-03 DIAGNOSIS — Z8619 Personal history of other infectious and parasitic diseases: Secondary | ICD-10-CM | POA: Insufficient documentation

## 2021-11-03 DIAGNOSIS — L290 Pruritus ani: Secondary | ICD-10-CM

## 2021-11-03 DIAGNOSIS — Z1211 Encounter for screening for malignant neoplasm of colon: Secondary | ICD-10-CM

## 2021-11-03 DIAGNOSIS — E876 Hypokalemia: Secondary | ICD-10-CM

## 2021-11-03 LAB — POCT URINALYSIS DIP (CLINITEK)
Bilirubin, UA: NEGATIVE
Blood, UA: NEGATIVE
Glucose, UA: NEGATIVE mg/dL
Ketones, POC UA: NEGATIVE mg/dL
Leukocytes, UA: NEGATIVE
Nitrite, UA: NEGATIVE
POC PROTEIN,UA: NEGATIVE
Spec Grav, UA: 1.015 (ref 1.010–1.025)
Urobilinogen, UA: 0.2 E.U./dL
pH, UA: 7.5 (ref 5.0–8.0)

## 2021-11-03 MED ORDER — NYSTATIN 100000 UNIT/GM EX CREA: 1.0000 "application " | TOPICAL_CREAM | Freq: Two times a day (BID) | CUTANEOUS | 1 refills | Status: DC

## 2021-11-03 NOTE — Progress Notes (Signed)
Subjective:    Patient ID: Jacqueline Hughes, female    DOB: 11/01/1965, 56 y.o.   MRN: [id number]  HPI Pt is a 56 yo female who presents to the clinic to follow up after hospital admission for C.diff and sepsis from 10/19/2021 to 10/27/2021 after Augmentin use for sinusitis.   At her peak she was having 23 loose stools a day. She was hypokalemic, hyponatremic, and hypomagnesemic. After receiving vancomycin and IV fluids patient has continued to feel better. She was noted to have a complex appearing 7.9 x 5.8cm left adnexal lesion that favored benign neoplasm on ultrasound. CA125 normal. She will need work up. Pt has never had colonoscopy.   She was discharged on vancomycin and bentyl. She has finished the vancomycin yesterday but continues on bentyl. She is still having 2-3 stools a day but soft and formed. Denies any melena or hematochezia. She does have some anal irritation and itching. Diflucan helped but request cream.   She does have ID appt tomorrow for C.diff.    .. Active Ambulatory Problems    Diagnosis Date Noted   Adult ADHD 08/25/2014   Depression 08/25/2014   Vitamin D deficiency 08/25/2014   Dyslexia 08/25/2014   Food intolerance 08/29/2014   Osteopenia 08/16/2015   Recurrent cold sores 01/20/2016   Varicose vein of leg 06/17/2017   Primary insomnia 06/17/2017   Cervical herniated disc 06/17/2017   ADD (attention deficit disorder) 03/05/2018   Memory changes 03/05/2018   Word finding difficulty 03/05/2018   Elevated LDL cholesterol level 03/07/2018   Depression, major, single episode, moderate ([place]) 08/02/2018   Grief reaction with prolonged bereavement 08/02/2018   Stress at work 08/07/2018   Muscle spasm 03/18/2019   Acute stress reaction 03/18/2019   Immunization reaction 01/01/2020   S/P bilateral breast implants 01/17/2020   Itching 01/17/2020   Rash 01/17/2020   Flushing 07/62/2633   Periumbilical pain 35/45/6256   Closed fracture of sesamoid bone of left  foot 08/14/2020   Moderate episode of recurrent major depressive disorder ([place]) 03/10/2021   Dehydration 10/18/2021   Diarrhea 10/18/2021   Acute maxillary sinusitis 10/18/2021   Pancolitis ([place]) 10/20/2021   C. difficile colitis 10/20/2021   Sepsis ([place]) 10/20/2021   Leukocytosis 10/20/2021   Acute hyponatremia 10/20/2021   Thyroid disease    Vaginal candidiasis 10/22/2021   Clostridioides difficile infection 11/03/2021   Anal itching 11/05/2021   Acute bilateral low back pain without sciatica 11/05/2021   Hypomagnesemia 11/05/2021   Hypokalemia 11/05/2021   Ovarian mass, left 11/05/2021   Resolved Ambulatory Problems    Diagnosis Date Noted   No Resolved Ambulatory Problems   Past Medical History:  Diagnosis Date   Anxiety    Hyperlipidemia      Review of Systems See HPI.     Objective:   Physical Exam Vitals reviewed.  Constitutional:      Appearance: Normal appearance.  HENT:     Head: Normocephalic.  Cardiovascular:     Rate and Rhythm: Normal rate and regular rhythm.     Pulses: Normal pulses.     Heart sounds: Normal heart sounds.  Pulmonary:     Effort: Pulmonary effort is normal.  Abdominal:     General: There is no distension.     Palpations: Abdomen is soft. There is no mass.     Tenderness: There is abdominal tenderness. There is no right CVA tenderness, left CVA tenderness, guarding or rebound.     Hernia: No hernia is  present.     Comments: Continues to be diffusely tender to palpation over abdomen  Musculoskeletal:     Right lower leg: No edema.     Left lower leg: No edema.  Neurological:     General: No focal deficit present.     Mental Status: She is alert and oriented to person, place, and time.  Psychiatric:        Mood and Affect: Mood normal.          Assessment & Plan:  Marland KitchenMarland KitchenFaith was seen today for hospitalization follow-up.  Diagnoses and all orders for this visit:  Clostridioides difficile infection -     CBC with  Differential/Platelet -     Basic metabolic panel -     Magnesium -     Ambulatory referral to Gastroenterology  Ovarian mass, left -     Ambulatory referral to Obstetrics / Gynecology  Acute hyponatremia -     Basic metabolic panel  Hypokalemia -     Basic metabolic panel  Hypomagnesemia -     Magnesium  Acute bilateral low back pain without sciatica -     POCT URINALYSIS DIP (CLINITEK) -     Urine Culture  Anal itching -     nystatin cream (MYCOSTATIN); Apply 1 application topically 2 (two) times daily.  Colon cancer screening -     Ambulatory referral to Gastroenterology   Vitals reassuring.  UA negative for leuks, nitrites, protein, blood.  Will culture to make sure no residual UTI after so many loose stools Will get labs to follow up on electrolyte abnormalities Added augmentin to intolerance list Made referral to GI for colonoscopy and C.diff follow up Continue bentyl for now ask GI and ID about probiotic Referral made for ovarian mass Nystatin given for anal itching consider sitz baths Continue to rest, hydrate and get good nutrition  Filled out FMLA paperwork to go back to work on 17th of January.

## 2021-11-03 NOTE — Patient Instructions (Signed)
Clostridioides Difficile Infection Clostridioides difficile infection, also known as C. difficile or C. diff infection, happens when too much C. diff bacteria grows. This can cause severe diarrhea and inflammation of the colon (colitis). It is linked to recent use of antibiotic medicine. This infection can be passed from person to person (is contagious). You also may be exposed to the bacteria from contact with food, water, or surfaces that have the bacteria on them. What are the causes? Certain bacteria live in the colon and help to digest food. This infection develops when the balance of helpful bacteria in the colon changes and the C. diff bacteria grow out of control. This is often caused by taking antibiotics. What increases the risk? You may be more likely to develop this condition if you: Take certain antibiotics that kill many types of bacteria or take antibiotics for a long time. Have an extended stay in a hospital or long-term care facility. Are older than age 25. Have had a C. diff infection before or have been exposed to C. diff bacteria. Have a weakened disease-fighting system (immune system). Take a medicine to reduce stomach acid, such as a proton pump inhibitor, for a long time. Have a serious underlying condition, such as colon cancer or inflammatory bowel disease (IBD). Have had a gastrointestinal (GI) tract procedure. What are the signs or symptoms? Symptoms of this condition include: Diarrhea (three or more times a day) for several days. Fever. Loss of appetite. Nausea. Swelling, pain, cramping, or tenderness in the abdomen. How is this diagnosed? This condition is diagnosed with: Your medical history and a physical exam. Tests, which may include: A test for C. diff in your stool (feces). Blood tests. Imaging tests, such as a CT scan of your abdomen. A procedure in which your colon is examined. This is rare. How is this treated? Treatment for this condition may  include: Stopping the antibiotics that you were taking when the C. diff infection began. Do this only as told by your health care provider. Taking certain antibiotics to stop C. diff growth. Taking stool from a healthy person and placing it into your colon (fecal transplant). This may be done if the infection keeps coming back. Having surgery to remove the infected part of the colon. This is rare. Follow these instructions at home: Medicines Take over-the-counter and prescription medicines only as told by your health care provider. Take antibiotic medicine as told by your health care provider. Do not stop taking the antibiotic even if you start to feel better. Do not treat diarrhea with medicines unless your health care provider tells you to. Eating and drinking  Follow instructions from your health care provider about eating and drinking restrictions. Eat bland foods in small amounts that are easy to digest. These include bananas, applesauce, rice, lean meats, toast, and crackers. Follow instructions on replacing body fluid that has been lost (rehydrate). This may include: Drinking clear fluids, such as water, clear fruit juice that is diluted with water, and low-calorie sports drinks. Sucking on ice chips. Taking an oral rehydration solution (ORS). This drink is sold at pharmacies and retail stores. Avoid milk, caffeine, and alcohol. Drink enough fluid to keep your urine pale yellow. General instructions Wash your hands often with soap and water for at least 20 seconds. Bathe or shower using soap and water daily. Return to your normal activities as told by your health care provider. Ask your health care provider what activities are safe for you. Be sure your home is  clean before you leave the hospital or clinic to go home. Then continue daily cleaning for at least a week. Keep all follow-up visits. This is important. How is this prevented? Hand hygiene  Wash your hands with soap and  water for at least 20 seconds before preparing food and after using the bathroom. Make sure the people you live with also wash their hands often with soap and water for at least 20 seconds. If you are being treated at a hospital or clinic, make sure that all health care providers and visitors wash their hands with soap and water before touching you. Contact precautions Tell your health care team right away if you develop diarrhea while in a hospital or long-term care facility. When visiting someone in a hospital or a long-term care facility, follow guidelines for wearing a gown, gloves, or other protective equipment. If possible, avoid contact with people who have diarrhea. Use a separate bathroom if you are sick and live with other people, if possible. Clean environment Clean surfaces that are touched often every day. C. diff bacteria are killed only by cleaning products that contain 10% chlorine bleach solution. Be sure to: Read the product's label to make sure the product will kill the bacteria on the surface you are cleaning. Clean frequently touched surfaces, such as toilet seats and flush handles, bathtubs, sinks, doorknobs, and work surfaces. If you are in the hospital, make sure that staff members clean the surfaces in your room daily. Let a staff person know right away if body fluids have splashed or spilled. Washing clothes and linens Use a powder laundry detergent containing chlorine bleach instead of liquid detergent. Powder detergents contain chlorine bleach in low levels to help kill bacteria. Run your empty washing machine on the hot setting once a month with enough detergent for a full load. This will kill any remaining C. diff bacteria. Contact a health care provider if: Your symptoms do not get better, or they get worse, even with treatment. Your symptoms go away and then come back. You have a fever. You develop new symptoms. Get help right away if: You have more pain or  tenderness in your abdomen. You have stool that is mostly bloody, or looks black and tarry. You cannot eat or drink without vomiting. You have signs of dehydration, such as: Dark urine, very little urine, or no urine. Cracked lips or dry mouth. Not making tears when you cry. Sunken eyes. Sleepiness. Weakness or dizziness. Summary Clostridioides difficile infection, or C. diff infection, can cause severe diarrhea and inflammation of the colon (colitis). It is linked to recent antibiotic use. C. diff infection can spread from person to person (is contagious). You also may be exposed to the bacteria from contact with food, water, or surfaces that have the bacteria on them. This infection may be treated by stopping the antibiotics you were using when the infection began. Fecal transplant or surgery may be needed for repeat or severe infections. Washing hands with soap and water for at least 20 seconds after you use the bathroom and before you eat, and cleaning surfaces with a 10% bleach solution, can help prevent or limit spread of this infection. This information is not intended to replace advice given to you by your health care provider. Make sure you discuss any questions you have with your health care provider. Document Revised: 02/06/2020 Document Reviewed: 02/06/2020 Elsevier Patient Education  [place].

## 2021-11-04 ENCOUNTER — Encounter: Payer: Self-pay | Admitting: Internal Medicine

## 2021-11-04 ENCOUNTER — Telehealth: Payer: Self-pay

## 2021-11-04 ENCOUNTER — Encounter: Payer: Self-pay | Admitting: Physician Assistant

## 2021-11-04 ENCOUNTER — Other Ambulatory Visit: Payer: Self-pay

## 2021-11-04 ENCOUNTER — Other Ambulatory Visit (HOSPITAL_COMMUNITY): Payer: Self-pay

## 2021-11-04 ENCOUNTER — Ambulatory Visit: Payer: BC Managed Care – PPO | Admitting: Internal Medicine

## 2021-11-04 VITALS — BP 106/76 | HR 84 | Temp 98.6°F | Ht 68.0 in | Wt 148.0 lb

## 2021-11-04 DIAGNOSIS — A0472 Enterocolitis due to Clostridium difficile, not specified as recurrent: Secondary | ICD-10-CM

## 2021-11-04 LAB — BASIC METABOLIC PANEL
BUN: 7 mg/dL (ref 7–25)
CO2: 30 mmol/L (ref 20–32)
Calcium: 9.4 mg/dL (ref 8.6–10.4)
Chloride: 102 mmol/L (ref 98–110)
Creat: 0.67 mg/dL (ref 0.50–1.03)
Glucose, Bld: 85 mg/dL (ref 65–99)
Potassium: 4.8 mmol/L (ref 3.5–5.3)
Sodium: 140 mmol/L (ref 135–146)

## 2021-11-04 LAB — CBC WITH DIFFERENTIAL/PLATELET
Absolute Monocytes: 578 cells/uL (ref 200–950)
Basophils Absolute: 77 cells/uL (ref 0–200)
Basophils Relative: 1.4 %
Eosinophils Absolute: 149 cells/uL (ref 15–500)
Eosinophils Relative: 2.7 %
HCT: 41 % (ref 35.0–45.0)
Hemoglobin: 13.4 g/dL (ref 11.7–15.5)
Lymphs Abs: 2008 cells/uL (ref 850–3900)
MCH: 29.7 pg (ref 27.0–33.0)
MCHC: 32.7 g/dL (ref 32.0–36.0)
MCV: 90.9 fL (ref 80.0–100.0)
MPV: 8.8 fL (ref 7.5–12.5)
Monocytes Relative: 10.5 %
Neutro Abs: 2690 cells/uL (ref 1500–7800)
Neutrophils Relative %: 48.9 %
Platelets: 462 10*3/uL — ABNORMAL HIGH (ref 140–400)
RBC: 4.51 10*6/uL (ref 3.80–5.10)
RDW: 13.9 % (ref 11.0–15.0)
Total Lymphocyte: 36.5 %
WBC: 5.5 10*3/uL (ref 3.8–10.8)

## 2021-11-04 LAB — MAGNESIUM: Magnesium: 2.1 mg/dL (ref 1.5–2.5)

## 2021-11-04 NOTE — Telephone Encounter (Addendum)
[facility] Patient Teacher, English as a foreign language completed.    The patient is insured through [employer].  Dificid & Vancomycin $30.00  We will continue to follow to see if copay assistance is needed.  Ileene Patrick, [facility] Patient [facility] for Infectious Disease Phone: [phone] Fax:  [phone]

## 2021-11-04 NOTE — Progress Notes (Signed)
Jacqueline Hughes,   From 8 days ago hemoglobin improved and back in normal range.  Calcium normal, potassium normal, sodium normal.  Magnesium normal.   Labs look great.

## 2021-11-04 NOTE — Progress Notes (Signed)
[facility] for Infectious Disease      Reason for Consult:C diff infection    Referring Physician:     Patient ID: Jacqueline Hughes, female    DOB: Nov 28, 1965, 56 y.o.   MRN: [id number]  HPI:   She is here with a recent history of C diff infection. She was hospitalized 12/20 - 10/27/21 for sepsis secondary to C diff infection following amoxicillin/clavulanate use.  She was having 10-15 episodes of diarrhea and CT with pancolitis.  Some blood while in the hospital.  She initially was evaluated and thought to have a viral illness, given doxycycline.  Diarrhea worsened and returned to the ED and C diff testing antigen and toxin positive.  Admitted for 8 days and treated with vancomycin.  Improved and in follow up with her PCP yesterday, WBC, Hgb, creat all wnl.  She is feeling better but had a couple of loose stools yesterday.   Past Medical History:  Diagnosis Date   ADD (attention deficit disorder)    Anxiety    Depression    Hyperlipidemia    Osteopenia    Thyroid disease    Vitamin D deficiency     Prior to Admission medications   Medication Sig Start Date End Date Taking? Authorizing Provider  ALPRAZolam Duanne Moron) 0.5 MG tablet Take 1 tablet (0.5 mg total) by mouth at bedtime as needed for anxiety. Patient not taking: Reported on 11/03/2021 03/18/19   Donella Stade, PA-C  dicyclomine (BENTYL) 10 MG capsule Take 1 capsule (10 mg total) by mouth 3 (three) times daily with meals as needed for spasms. 10/27/21   Hosie Poisson, MD  famotidine (PEPCID) 20 MG tablet Take 1 tablet (20 mg total) by mouth daily. Patient not taking: Reported on 11/03/2021 10/27/21   Hosie Poisson, MD  FLUoxetine (PROZAC) 20 MG capsule Take 1 capsule (20 mg total) by mouth daily. Patient not taking: Reported on 11/03/2021 09/21/21   Donella Stade, PA-C  nystatin cream (MYCOSTATIN) Apply 1 application topically 2 (two) times daily. 11/03/21   Donella Stade, PA-C    Allergies  Allergen Reactions   E-Mycin  [Erythromycin]    Lexapro [Escitalopram Oxalate]     Confused and disoriented.    Augmentin [Amoxicillin-Pot Clavulanate] Diarrhea    C.diff 1 month after taking   Lactose Intolerance (Gi)    Covid-19 Mrna Vaccine Therapist, music) [Covid-19 Mrna Vacc (Moderna)] Rash    Started on prednisone last night- vaccine on Saturday    Social History   Tobacco Use   Smoking status: Former    Types: Cigarettes    Quit date: 08/25/2004    Years since quitting: 17.2   Smokeless tobacco: Never  Vaping Use   Vaping Use: Never used  Substance Use Topics   Alcohol use: Yes    Comment: social   Drug use: No    Family History  Problem Relation Age of Onset   Alcoholism Other        granparents   Depression Other        aunts    Review of Systems  Constitutional: negative for fevers, chills, and malaise Integument/breast: negative for rash All other systems reviewed and are negative    Constitutional: in no apparent distress There were no vitals filed for this visit. EYES: anicteric Respiratory: normal respiratory effort GI: soft Skin: no rash Neuro: non-focal  Labs: Lab Results  Component Value Date   WBC 5.5 11/03/2021   HGB 13.4 11/03/2021   HCT  41.0 11/03/2021   MCV 90.9 11/03/2021   PLT 462 (H) 11/03/2021    Lab Results  Component Value Date   CREATININE 0.67 11/03/2021   BUN 7 11/03/2021   NA 140 11/03/2021   K 4.8 11/03/2021   CL 102 11/03/2021   CO2 30 11/03/2021    Lab Results  Component Value Date   ALT 18 10/27/2021   AST 18 10/27/2021   ALKPHOS 43 10/27/2021   BILITOT 0.2 (L) 10/27/2021     Assessment: C diff infection with sepsis, resolved.  I discussed C diff, treatments, chances of relapse and post C diff care.  I discussed avoidance of acid blockers and osmotic agents while recovering and emphasized avoidance of antibiotics without a clear indication of a bacterial infection.  Along those lines, I discussed that common uses for viral respiratory infections  and asymptomatic conditions such asymptomatic bacteruria do not require antibiotic treatment.  I also discussed the typical period of time of several weeks up to 6 months for recovery and she may continue to have intermittent bouts of bloating, diarrhea but not persistent.  I also educated her that C diff testing is not indicated as it will remain positive likely up to 6 months.    Plan: 1)  avoid ppi, stop Pepcid if not needed 2) return precautions reviewed and typically does not relapse 3) probiotic have not shown any benefit in prevention, recovery or treatment  She will return as needed

## 2021-11-05 ENCOUNTER — Encounter: Payer: Self-pay | Admitting: Gastroenterology

## 2021-11-05 DIAGNOSIS — N838 Other noninflammatory disorders of ovary, fallopian tube and broad ligament: Secondary | ICD-10-CM | POA: Insufficient documentation

## 2021-11-05 DIAGNOSIS — L290 Pruritus ani: Secondary | ICD-10-CM | POA: Insufficient documentation

## 2021-11-05 DIAGNOSIS — E876 Hypokalemia: Secondary | ICD-10-CM | POA: Insufficient documentation

## 2021-11-05 DIAGNOSIS — M545 Low back pain, unspecified: Secondary | ICD-10-CM | POA: Insufficient documentation

## 2021-11-05 LAB — URINE CULTURE
MICRO NUMBER:: 12829724
SPECIMEN QUALITY:: ADEQUATE

## 2021-11-05 NOTE — Telephone Encounter (Signed)
Can we work on forms?

## 2021-11-05 NOTE — Telephone Encounter (Signed)
I didn't make community care referral. I made GI and GYN referral.

## 2021-11-05 NOTE — Telephone Encounter (Signed)
Which referral has been denied? 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

## 2021-11-05 NOTE — Progress Notes (Signed)
Normal bacteria found on culture.

## 2021-11-08 ENCOUNTER — Telehealth: Payer: Self-pay

## 2021-11-08 NOTE — Telephone Encounter (Signed)
Jacqueline Hughes called and states she has had diarrhea for the last two days. 6 times yesterday and 3 times today. Friday she ate a regular meal with meat, potatoes and green beans. She had a lot of GI discomfort and went back on the [employer]. She does have some nausea. Denies vomiting, fever, chills or sweats.  GI appointment is on November 24, 2021   She is eating and drinking things like...  Sports drinks with orange juice Yogurt Apple sauce Pancakes  Mashed potatoes Canned chicken Canned carrots

## 2021-11-08 NOTE — Telephone Encounter (Signed)
Breeback, Leyani Gargus L, PA-C  You 3 minutes ago (4:18 PM)   Episodes of diarrhea and bloating are to be expected from Infectious disease note but not persistent it is reassuring that you had less bowel movements today. If you were to have a reoccurrence we would treat with vancomycin again. Did the stools have C.diff odor? I think would like to give 24 more hours and see how bowel movements are doing before deciding if we do another round of vancomycin.     Patient made aware of above information. She states she never really had the "Cdiff odor" but bowel movements look the same as when she had cdiff. She will keep an eye on them tonight and tomorrow morning and let us know tomorrow how she is doing.

## 2021-11-16 ENCOUNTER — Other Ambulatory Visit: Payer: Self-pay | Admitting: Obstetrics & Gynecology

## 2021-11-16 DIAGNOSIS — R1900 Intra-abdominal and pelvic swelling, mass and lump, unspecified site: Secondary | ICD-10-CM

## 2021-11-16 DIAGNOSIS — Z780 Asymptomatic menopausal state: Secondary | ICD-10-CM

## 2021-11-16 NOTE — Progress Notes (Signed)
MR pelvis ordered for further characterization of pelvic mass, will follow up results and manage accordingly.    Verita Schneiders, MD, [place] for [employer], [place]

## 2021-11-18 ENCOUNTER — Ambulatory Visit: Payer: BC Managed Care – PPO | Admitting: Obstetrics & Gynecology

## 2021-11-18 ENCOUNTER — Encounter: Payer: Self-pay | Admitting: Obstetrics & Gynecology

## 2021-11-18 ENCOUNTER — Other Ambulatory Visit: Payer: Self-pay

## 2021-11-18 VITALS — BP 105/74 | HR 70 | Ht 68.0 in | Wt 150.0 lb

## 2021-11-18 DIAGNOSIS — R1900 Intra-abdominal and pelvic swelling, mass and lump, unspecified site: Secondary | ICD-10-CM

## 2021-11-18 DIAGNOSIS — N8110 Cystocele, unspecified: Secondary | ICD-10-CM | POA: Diagnosis not present

## 2021-11-18 DIAGNOSIS — N3946 Mixed incontinence: Secondary | ICD-10-CM

## 2021-11-18 DIAGNOSIS — Z780 Asymptomatic menopausal state: Secondary | ICD-10-CM

## 2021-11-18 NOTE — Progress Notes (Signed)
GYNECOLOGY OFFICE VISIT NOTE  History:   Jacqueline Hughes is a 56 y.o. [phone] PMP with history of TAH for benign indications here today for discussion about management of complex solid adnexal mass incidentally found during recent admission for colitis. She was seen in the hospital by one of my partners as a consult, and was told to follow up for surgical planning.  Today, she reports no pain due to pelvic mass or bleeding, denies any abnormal vaginal discharge.  She does report a long history of incontinence and bladder prolapse; always leaks during coughing, sneezing, exercise, and also if she does not make it to bathroom in time,  She wants this to be further evaluated and managed.  Has history of bladder suspension surgery in the past.    Past Medical History:  Diagnosis Date   ADD (attention deficit disorder)    Anxiety    C. difficile colitis    Depression    Female bladder prolapse    HPV in female    Hyperlipidemia    Osteopenia    Thyroid disease    Vaginal Pap smear, abnormal    Vitamin D deficiency     Past Surgical History:  Procedure Laterality Date   ABDOMINAL HYSTERECTOMY     AUGMENTATION MAMMAPLASTY     BLADDER SUSPENSION     BREAST SURGERY     COLPOSCOPY     LAPAROTOMY     LEEP      The following portions of the patient's history were reviewed and updated as appropriate: allergies, current medications, past family history, past medical history, past social history, past surgical history and problem list.   Health Maintenance:  Normal mammogram on 03/07/2018, scheduled on 11/25/2021.   Review of Systems:  Pertinent items noted in HPI and remainder of comprehensive ROS otherwise negative.  Physical Exam:  BP 105/74    Pulse 70    Ht 5\' 8"  (1.727 m)    Wt 150 lb (68 kg)    BMI 22.81 kg/m  CONSTITUTIONAL: Well-developed, well-nourished female in no acute distress.  HEENT:  Normocephalic, atraumatic. External right and left ear normal. No scleral icterus.  NECK:  Normal range of motion, supple, no masses noted on observation SKIN: No rash noted. Not diaphoretic. No erythema. No pallor. MUSCULOSKELETAL: Normal range of motion. No edema noted. NEUROLOGIC: Alert and oriented to person, place, and time. Normal muscle tone coordination. No cranial nerve deficit noted. PSYCHIATRIC: Normal mood and affect. Normal behavior. Normal judgment and thought content. CARDIOVASCULAR: Normal heart rate noted RESPIRATORY: Effort and breath sounds normal, no problems with respiration noted ABDOMEN: No masses noted. No other overt distention noted.   PELVIC: Normal appearing external genitalia with moderate atrophy; normal urethral meatus; Grade 2 cystocele noted with Valsalva.  No abnormal discharge noted.  Performed in the presence of a chaperone  Labs and Imaging CT ABDOMEN PELVIS W CONTRAST  Result Date: 10/19/2021 CLINICAL DATA:  Diarrhea Abdominal pain, acute, nonlocalized EXAM: CT ABDOMEN AND PELVIS WITH CONTRAST TECHNIQUE: Multidetector CT imaging of the abdomen and pelvis was performed using the standard protocol following bolus administration of intravenous contrast. CONTRAST:  111mL OMNIPAQUE IOHEXOL 300 MG/ML  SOLN COMPARISON:  None. FINDINGS: Lower chest: No acute abnormality. Bilateral breast implants are noted partially. Hepatobiliary: No focal liver abnormality. No gallstones, gallbladder wall thickening, or pericholecystic fluid. No biliary dilatation. Pancreas: No focal lesion. Normal pancreatic contour. No surrounding inflammatory changes. No main pancreatic ductal dilatation. Spleen: Normal in size without focal abnormality.  Adrenals/Urinary Tract: No adrenal nodule bilaterally. Bilateral kidneys enhance symmetrically. No hydronephrosis. No hydroureter. The urinary bladder is unremarkable. On delayed imaging, there is no urothelial wall thickening and there are no filling defects in the opacified portions of the bilateral collecting systems or ureters.  Stomach/Bowel: Stomach is within normal limits. No evidence of small bowel wall thickening or dilatation. The colon demonstrates bowel wall thickening, mucosal hyperemia, pericolonic fat stranding. Appendix appears normal. Vascular/Lymphatic: No abdominal aorta or iliac aneurysm. No abdominal, pelvic, or inguinal lymphadenopathy. Reproductive: Complex appearing 7.9 x 5.8 cm left adnexal lesion. The right adnexa/ovary is grossly unremarkable. Status post hysterectomy. Other: Trace simple free fluid ascites. No intraperitoneal free gas. No organized fluid collection. Musculoskeletal: No abdominal wall hernia or abnormality. No suspicious lytic or blastic osseous lesions. No acute displaced fracture. Multilevel degenerative changes of the spine. IMPRESSION: 1. Pancolitis. Differential diagnosis for etiology includes infection or inflammation. No associated bowel perforation or obstruction. 2. Complex appearing 7.9 x 5.8 cm left adnexal lesion. Recommend pelvic ultrasound and gynecologic consultation. 3. Status post hysterectomy. 4. Trace simple free fluid ascites. Electronically Signed   By: Iven Finn M.D.   On: 10/19/2021 23:06   US PELVIC COMPLETE WITH TRANSVAGINAL  Result Date: 10/24/2021 CLINICAL DATA:  Abdominal pain, left adnexal mass on CT, prior hysterectomy EXAM: TRANSABDOMINAL AND TRANSVAGINAL ULTRASOUND OF PELVIS TECHNIQUE: Both transabdominal and transvaginal ultrasound examinations of the pelvis were performed. Transabdominal technique was performed for global imaging of the pelvis including uterus, ovaries, adnexal regions, and pelvic cul-de-sac. It was necessary to proceed with endovaginal exam following the transabdominal exam to visualize the bilateral ovaries. COMPARISON:  CT abdomen/pelvis dated 10/19/2021 FINDINGS: Uterus Surgically absent. Right ovary Measurements: 2.1 x 1.7 x 1.8 cm = volume: 3.5 mL. 7.9 x 5.1 x 5.3 cm Left ovary Measurements: Size in 3 dimensions = volume: 111.7 mL.  Solid appearance with dirty shadowing, favoring ovarian neoplasm such as ovarian fibroma/fibrothecoma, less likely broad ligament fibroid. Other findings No abnormal free fluid. IMPRESSION: 7.9 cm left adnexal mass, favoring ovarian neoplasm such as ovarian fibroma/fibrothecoma, less likely broad ligament fibroid. GYN surgical consultation is suggested. Electronically Signed   By: Julian Hy M.D.   On: 10/24/2021 14:38      Assessment and Plan:     1. Pelvic mass in female 2. Postmenopausal state Imaging studies reviewed, suggest ovarian fibroma or fibrothecoma. But given her postmenopausal state, neoplasm needs to be ruled out before proper surgical management is initiated.  Will check tumor markers and get MRI for further characterization.  Also concerned about her colitis and adhesions to bowel, may need to consult with GYN Oncology or General Surgery depending on results.  - Cancer antigen 19-9 - CEA - Lactate Dehydrogenase (LDH) - AFP tumor marker - CA 125 - hCG, Total, Quantitative - MR Pelvis W WO Contrast  3. Baden-Walker grade 2 cystocele 4. Mixed urge and stress incontinence Referred to Dr. Wannetta Sender. If surgical management is needed, may be able to coordinate this with removal of her pelvic mass. - Ambulatory referral to Urogynecology  Routine preventative health maintenance measures emphasized. Please refer to After Visit Summary for other counseling recommendations.   Return for After MRI to discuss surgical planning (can be virtual or face to face).    I spent 30 minutes dedicated to the care of this patient including pre-visit review of records, face to face time with the patient discussing her conditions and treatments and post visit orders.    Verita Schneiders,  MD, [place], [facility] for [employer], [place]

## 2021-11-19 ENCOUNTER — Encounter: Payer: Self-pay | Admitting: Obstetrics & Gynecology

## 2021-11-19 LAB — CA 125: CA 125: 9 U/mL (ref <35)

## 2021-11-19 LAB — HCG, TOTAL, QUANTITATIVE: hCG, Beta Chain, Quant, S: 6 m[IU]/mL — ABNORMAL HIGH

## 2021-11-19 LAB — LACTATE DEHYDROGENASE: LDH: 136 U/L (ref 120–250)

## 2021-11-19 LAB — CANCER ANTIGEN 19-9: CA 19-9: 35 U/mL — ABNORMAL HIGH (ref <34)

## 2021-11-19 LAB — CEA: CEA: <0.5 ng/mL

## 2021-11-19 LAB — AFP TUMOR MARKER: AFP-Tumor Marker: 2.4 ng/mL

## 2021-11-24 ENCOUNTER — Other Ambulatory Visit: Payer: Self-pay

## 2021-11-24 ENCOUNTER — Encounter: Payer: Self-pay | Admitting: Gastroenterology

## 2021-11-24 ENCOUNTER — Other Ambulatory Visit (INDEPENDENT_AMBULATORY_CARE_PROVIDER_SITE_OTHER): Payer: BC Managed Care – PPO

## 2021-11-24 ENCOUNTER — Ambulatory Visit (INDEPENDENT_AMBULATORY_CARE_PROVIDER_SITE_OTHER): Payer: BC Managed Care – PPO | Admitting: Gastroenterology

## 2021-11-24 VITALS — BP 122/76 | HR 86 | Ht 68.5 in | Wt 151.5 lb

## 2021-11-24 DIAGNOSIS — K580 Irritable bowel syndrome with diarrhea: Secondary | ICD-10-CM

## 2021-11-24 DIAGNOSIS — A0472 Enterocolitis due to Clostridium difficile, not specified as recurrent: Secondary | ICD-10-CM

## 2021-11-24 DIAGNOSIS — E538 Deficiency of other specified B group vitamins: Secondary | ICD-10-CM

## 2021-11-24 DIAGNOSIS — R933 Abnormal findings on diagnostic imaging of other parts of digestive tract: Secondary | ICD-10-CM

## 2021-11-24 DIAGNOSIS — R109 Unspecified abdominal pain: Secondary | ICD-10-CM

## 2021-11-24 DIAGNOSIS — N9489 Other specified conditions associated with female genital organs and menstrual cycle: Secondary | ICD-10-CM

## 2021-11-24 MED ORDER — DICYCLOMINE HCL 10 MG PO CAPS: 10.0000 mg | ORAL_CAPSULE | Freq: Four times a day (QID) | ORAL | 1 refills | Status: DC | PRN

## 2021-11-24 NOTE — Patient Instructions (Signed)
If you are age 55 or older, your body mass index should be between 23-30. Your Body mass index is 22.7 kg/m. If this is out of the aforementioned range listed, please consider follow up with your Primary Care Provider.  If you are age 21 or younger, your body mass index should be between 19-25. Your Body mass index is 22.7 kg/m. If this is out of the aformentioned range listed, please consider follow up with your Primary Care Provider.   __________________________________________________________  The [facility] providers would like to encourage you to use [facility] to communicate with providers for non-urgent requests or questions.  Due to long hold times on the telephone, sending your provider a message by [facility] may be a faster and more efficient way to get a response.  Please allow 48 business hours for a response.  Please remember that this is for non-urgent requests.   Please go to the lab on the 2nd floor [address] before you leave the office today.   We have sent the following medications to your pharmacy for you to pick up at your convenience:  Bentyl 10 mg  Start on a probiotic  We will send you a recall notice to schedule a colonocopy in 6 months  Thank you for choosing me and [facility].  Vito Cirigliano, D.O.

## 2021-11-24 NOTE — Progress Notes (Signed)
Chief Complaint: Hospital follow-up, C. difficile infection  Referring Provider:     Donella Stade, PA-C   HPI:    Jacqueline Hughes is a 56 y.o. female with a history of ADD, anxiety, depression, hyperlipidemia, C. difficile colitis, arthritis, hysterectomy, bladder suspension, referred to the Gastroenterology Clinic for evaluation of recent C. difficile infection and chronic GI symptoms as below.  Hospital admission December 20-28 for C. difficile infection with sepsis.  Was treated with vancomycin.  C. difficile infection started within 11 days of Augmentin for sinus infection.  No inpatient GI consultation during that admission.  No previous C. difficile infection.    Completed course of vancomycin.  Was told to stop Pepcid and avoid PPI if not absolutely needed. - 10/19/2021: CT abdomen/pelvis: Bowel wall thickening of the colon with mucosal hyperemia, pericolonic fat stranding c/w inflammatory/infectious pancolitis.  Complex appearing 7.9 x 5.8 cm left adnexal lesion. - 10/24/2021: 7.9 cm left adnexal mass, favoring ovarian neoplasm such as ovarian fibroma/fibrothecoma - 10/26/2021: H/H 11/34.  Folate deficiency (3.6) on inpatient labs.  B12 low normal at 209.  Normal iron indices - 11/03/2021: PLT 462, WBC 5.5, H/H13.4/41.  Normal BMP - 11/04/2021: Seen in ID clinic for hospital follow-up  Does report a history of diarrhea and GI symptoms "my entire life". Post prandial urgency, abdominal bloating. Intermittent abdominal cramping. Years ago trialed GFD x6 months with overall improvement. Many "food insensitivities".  Stools are now back to that baseline.  No fever, chills.  No previous EGD. Has had several colonoscopies in the past in [place]. Possibly polyp on 1 of those colonoscopies, but not sure.  No previous reports available for review.  Does have symptomatic hemorrhoids, worse with diarrha. Can bleed intermittently. Uses OTC hemorrhoid creams, wipes prn. Will have to  reduce manually at times when inflamed.   Recently seen in the GYN clinic on 11/18/2021 for evaluation of ovarian mass on imaging during recent hospitalization.  Plan for MRI pelvis, tumor markers. - CEA, AFP normal - CA125 normal - CA 19-9 35 (ULN 34)  Was only taking Pepcid for recent HB when starting antifungal Rx. Othersise, no long term need for PPI or H2RA.    Past Medical History:  Diagnosis Date   ADD (attention deficit disorder)    Anxiety    C. difficile colitis    Depression    Depression    Female bladder prolapse    HPV in female    Hyperlipidemia    Osteopenia    Thyroid disease    Vaginal Pap smear, abnormal    Vitamin D deficiency      Past Surgical History:  Procedure Laterality Date   ABDOMINAL HYSTERECTOMY     AUGMENTATION MAMMAPLASTY     BLADDER SUSPENSION     BREAST SURGERY     COLONOSCOPY     2008 [facility]   COLPOSCOPY     LAPAROTOMY     LEEP     Family History  Problem Relation Age of Onset   Uterine cancer Maternal Grandmother    Breast cancer Maternal Grandmother    Alcoholism Other        granparents   Depression Other        aunts   Colon cancer Neg Hx    Esophageal cancer Neg Hx    Social History   Tobacco Use   Smoking status: Former    Types: Cigarettes  Quit date: 08/25/2004    Years since quitting: 17.2   Smokeless tobacco: Never  Vaping Use   Vaping Use: Never used  Substance Use Topics   Alcohol use: Not Currently    Comment: 1 drink a week/socail   Drug use: No   Current Outpatient Medications  Medication Sig Dispense Refill   ALPRAZolam (XANAX) 0.5 MG tablet Take 1 tablet (0.5 mg total) by mouth at bedtime as needed for anxiety. (Patient not taking: Reported on 11/18/2021) 20 tablet 3   amphetamine-dextroamphetamine (ADDERALL XR) 15 MG 24 hr capsule Take 15 mg by mouth every morning. (Patient not taking: Reported on 11/24/2021)     dicyclomine (BENTYL) 10 MG capsule Take 1 capsule (10 mg  total) by mouth 3 (three) times daily with meals as needed for spasms. (Patient not taking: Reported on 11/18/2021) 30 capsule 0   famotidine (PEPCID) 20 MG tablet Take 1 tablet (20 mg total) by mouth daily. (Patient not taking: Reported on 11/18/2021) 30 tablet 0   FLUoxetine (PROZAC) 20 MG capsule Take 1 capsule (20 mg total) by mouth daily. (Patient not taking: Reported on 11/03/2021) 90 capsule 3   nystatin cream (MYCOSTATIN) Apply 1 application topically 2 (two) times daily. (Patient not taking: Reported on 11/24/2021) 30 g 1   Vitamin D, Ergocalciferol, (DRISDOL) 1.25 MG (50000 UNIT) CAPS capsule Take 50,000 Units by mouth every 7 (seven) days. (Patient not taking: Reported on 11/24/2021)     No current facility-administered medications for this visit.   Allergies  Allergen Reactions   E-Mycin [Erythromycin]     Rash   Lexapro [Escitalopram Oxalate]     Confused and disoriented.    Augmentin [Amoxicillin-Pot Clavulanate] Diarrhea    Contracted C.diff 14 days finishing augmentin  Noted diarrhea 3 days after starting augmentin    Lactose Intolerance (Gi)    Covid-19 Mrna Vaccine [employer]) [Covid-19 Mrna Vacc (Moderna)] Rash    Started on prednisone last night- vaccine on Saturday     Review of Systems: All systems reviewed and negative except where noted in HPI.     Physical Exam:    Wt Readings from Last 3 Encounters:  11/24/21 151 lb 8 oz (68.7 kg)  11/18/21 150 lb (68 kg)  11/04/21 148 lb (67.1 kg)    BP 122/76    Pulse 86    Ht 5' 8.5" (1.74 m)    Wt 151 lb 8 oz (68.7 kg)    BMI 22.70 kg/m  Constitutional:  Pleasant, in no acute distress. Psychiatric: Normal mood and affect. Behavior is normal. Abdominal: Nondistended Neurological: Alert and oriented to person place and time. Skin: Skin is warm and dry. No rashes noted. Rectal: Exam deferred by patient to time of colonoscopy.    ASSESSMENT AND PLAN;   1) C. difficile infection - Stools now back to baseline -  Discussed CDI at length today - I think it is reasonable to give 4-week course of probiotics for restoration of gut microbiota - Briefly discussed the role of Rebyota if repeat C. difficile infection - Agree with holding Pepcid given recent CDI and no chronic indication  2) IBS 3) Abdominal cramping - Does have chronic GI symptoms superimposed on her recent CDI that seem most consistent with IBS - Start liberalizing diet.  Start low FODMAP diet.  Provided with handout and detailed instruction today - Bentyl 10 mg prn abdominal cramping - tTG IgA, IgA total  4) Colitis on CT - Secondary to above C. difficile infection - Colonoscopy  in 6 months to ensure resolution along with routine CRC screening  5) Colon cancer screening - As above, plan for colonoscopy in 6 months  6) Folate deficiency 7) B12 insufficiency - Recheck folate and B12. If still deficient, plan to start supplementation  8) Adnexal mass - Follow-up with GYN clinic - Depending on plan/timing of surgery, could delay colonoscopy as appropriate  The indications, risks, and benefits of colonoscopy were explained to the patient in detail. Risks include but are not limited to bleeding, perforation, adverse reaction to medications, and cardiopulmonary compromise. Sequelae include but are not limited to the possibility of surgery, hospitalization, and mortality. The patient verbalized understanding and wished to proceed. All questions answered, referred to the scheduler and bowel prep ordered. Further recommendations pending results of the exam.    Lavena Bullion, DO, FACG  11/24/2021, 2:46 PM   Breeback, Royetta Car, PA-C

## 2021-11-25 ENCOUNTER — Other Ambulatory Visit: Payer: Self-pay

## 2021-11-25 ENCOUNTER — Ambulatory Visit (INDEPENDENT_AMBULATORY_CARE_PROVIDER_SITE_OTHER): Payer: BC Managed Care – PPO

## 2021-11-25 ENCOUNTER — Encounter: Payer: Self-pay | Admitting: Internal Medicine

## 2021-11-25 ENCOUNTER — Telehealth: Payer: Self-pay | Admitting: General Surgery

## 2021-11-25 DIAGNOSIS — D509 Iron deficiency anemia, unspecified: Secondary | ICD-10-CM

## 2021-11-25 DIAGNOSIS — Z1231 Encounter for screening mammogram for malignant neoplasm of breast: Secondary | ICD-10-CM | POA: Diagnosis not present

## 2021-11-25 LAB — VITAMIN B12: Vitamin B-12: 178 pg/mL — ABNORMAL LOW (ref 211–911)

## 2021-11-25 LAB — FOLATE: Folate: 6.3 ng/mL

## 2021-11-25 LAB — TISSUE TRANSGLUTAMINASE, IGA: (tTG) Ab, IgA: <1 U/mL

## 2021-11-25 LAB — IGA: Immunoglobulin A: 612 mg/dL — ABNORMAL HIGH (ref 47–310)

## 2021-11-25 NOTE — Telephone Encounter (Signed)
Tried to contact the patient and the phone went straight to a busy signal. Will send a mychart message for patient regarding her lab test. Orders placed for 3 month labs

## 2021-11-25 NOTE — Telephone Encounter (Signed)
-----   Message from [place], DO sent at 11/25/2021  1:01 PM EST ----- Labs notable for B12 and folate deficiency. Will plan on parenteral repletion as below: - B12 1000 mcg PO daily - Folic acid 1 mg/day - Repeat Vitamin B12 and folate level 3 months after starting therapy to monitor for effect  - If still deficient on follow-up, plan for EGD at the time of colonoscopy to rule out alternate sources, including Pernicious Anemia

## 2021-11-26 NOTE — Progress Notes (Signed)
Normal mammogram. Follow up in 1 year.

## 2021-12-13 ENCOUNTER — Other Ambulatory Visit: Payer: BC Managed Care – PPO

## 2021-12-13 ENCOUNTER — Ambulatory Visit
Admission: RE | Admit: 2021-12-13 | Discharge: 2021-12-13 | Disposition: A | Discharge status: Home / Self Care | Payer: BC Managed Care – PPO | Source: Ambulatory Visit | Attending: Obstetrics & Gynecology | Admitting: Obstetrics & Gynecology

## 2021-12-13 DIAGNOSIS — Z780 Asymptomatic menopausal state: Secondary | ICD-10-CM

## 2021-12-13 DIAGNOSIS — R1900 Intra-abdominal and pelvic swelling, mass and lump, unspecified site: Secondary | ICD-10-CM

## 2021-12-13 MED ORDER — GADOBENATE DIMEGLUMINE 529 MG/ML IV SOLN
14.0000 mL | Freq: Once | INTRAVENOUS | Status: AC | PRN
  Administered 2021-12-13: 14 mL via INTRAVENOUS

## 2021-12-13 MED ORDER — DIPHENHYDRAMINE HCL 50 MG/ML IJ SOLN
50.0000 mg | Freq: Once | INTRAMUSCULAR | Status: AC
  Administered 2021-12-13: 50 mg via INTRAVENOUS

## 2021-12-18 ENCOUNTER — Other Ambulatory Visit: Payer: Self-pay | Admitting: Gastroenterology

## 2022-01-04 ENCOUNTER — Other Ambulatory Visit: Payer: Self-pay | Admitting: Physician Assistant

## 2022-01-04 ENCOUNTER — Other Ambulatory Visit (HOSPITAL_COMMUNITY): Payer: Self-pay

## 2022-01-04 DIAGNOSIS — F909 Attention-deficit hyperactivity disorder, unspecified type: Secondary | ICD-10-CM

## 2022-01-04 MED ORDER — AMPHETAMINE-DEXTROAMPHET ER 15 MG PO CP24
15.0000 mg | ORAL_CAPSULE | ORAL | 0 refills | Status: DC
  Filled 2022-01-04: qty 30, 30d supply, fill #0

## 2022-01-04 NOTE — Telephone Encounter (Signed)
Last written 11/20/2021 for one month ?Last seen 11/03/2021 ? ?

## 2022-01-05 ENCOUNTER — Other Ambulatory Visit (HOSPITAL_COMMUNITY): Payer: Self-pay

## 2022-01-08 ENCOUNTER — Other Ambulatory Visit (HOSPITAL_COMMUNITY): Payer: Self-pay

## 2022-01-12 ENCOUNTER — Encounter: Payer: Self-pay | Admitting: Obstetrics and Gynecology

## 2022-01-12 ENCOUNTER — Encounter: Payer: Self-pay | Admitting: Physician Assistant

## 2022-01-12 ENCOUNTER — Other Ambulatory Visit: Payer: Self-pay

## 2022-01-12 ENCOUNTER — Ambulatory Visit (INDEPENDENT_AMBULATORY_CARE_PROVIDER_SITE_OTHER): Payer: BC Managed Care – PPO | Admitting: Obstetrics and Gynecology

## 2022-01-12 ENCOUNTER — Other Ambulatory Visit (HOSPITAL_COMMUNITY): Payer: Self-pay

## 2022-01-12 VITALS — BP 128/79 | HR 70 | Ht 69.0 in | Wt 150.0 lb

## 2022-01-12 DIAGNOSIS — N838 Other noninflammatory disorders of ovary, fallopian tube and broad ligament: Secondary | ICD-10-CM

## 2022-01-12 DIAGNOSIS — N993 Prolapse of vaginal vault after hysterectomy: Secondary | ICD-10-CM

## 2022-01-12 DIAGNOSIS — N8110 Cystocele, unspecified: Secondary | ICD-10-CM

## 2022-01-12 DIAGNOSIS — N393 Stress incontinence (female) (male): Secondary | ICD-10-CM | POA: Diagnosis not present

## 2022-01-12 DIAGNOSIS — R350 Frequency of micturition: Secondary | ICD-10-CM | POA: Diagnosis not present

## 2022-01-12 DIAGNOSIS — F909 Attention-deficit hyperactivity disorder, unspecified type: Secondary | ICD-10-CM

## 2022-01-12 MED ORDER — AMPHETAMINE-DEXTROAMPHET ER 15 MG PO CP24
15.0000 mg | ORAL_CAPSULE | ORAL | 0 refills | Status: DC
  Filled 2022-01-12 – 2022-02-09 (×2): qty 30, 30d supply, fill #0

## 2022-01-12 MED ORDER — AMPHETAMINE-DEXTROAMPHET ER 15 MG PO CP24
15.0000 mg | ORAL_CAPSULE | ORAL | 0 refills | Status: DC
Start: 2022-02-12 — End: 2022-06-15
  Filled 2022-03-22: qty 30, 30d supply, fill #0

## 2022-01-12 NOTE — Progress Notes (Signed)
[facility] ?New Patient Evaluation and Consultation ? ?Referring Provider: Osborne Oman, MD ?PCP: Donella Stade, PA-C ?Date of Service: 01/12/2022 ? ?SUBJECTIVE ?Chief Complaint: New Patient (Initial Visit) (Jacqueline Hughes is a 56 y.o. female here for a consult on incontinence. Pt said she was told she has a small prolapse.) ? ?History of Present Illness: Jacqueline Hughes is a 56 y.o. White or Caucasian female seen in consultation at the request of Dr. Harolyn Rutherford for evaluation of prolapse and incontinence.   ? ?Review of records significant for: ?Has a history of c.diff and was admitted to the hospital. Had Ct scan which showed ovarian mass, which prompted referral to GYN. Also noted to have issues with incontinence and prolapse.  ? ?TVUS 10/24/21: ?IMPRESSION: ?7.9 cm left adnexal mass, favoring ovarian neoplasm such as ovarian ?fibroma/fibrothecoma, less likely broad ligament fibroid.  ? ?Labs significant for CEA, CA 19-9. CA 125, LDH, AFP and HCG all within normal limits. ? ?Urinary Symptoms: ?Leaks urine with cough/ sneeze, lifting, during sex, and with a full bladder ?Leaks infrequently with urgency ?Leakage is intermittent.  ?Pad use: 1-2 liners/ mini-pads per day.   ?She is bothered by her UI symptoms. ? ?Day time voids 10+.  Nocturia: 2-3 times per night to void. ?Voiding dysfunction: she does not empty her bladder well.  ?does not use a catheter to empty bladder.  ?When urinating, she feels dribbling after finishing, the need to urinate multiple times in a row, and to push on her belly or vagina to empty bladder. Sometimes has to rock to empty.  ?Drinks: drinks 3 cups a day, 2-3 bottles of water. Occasionally will have a soda. Drinks up until bedtime.  ? ?UTIs:  0  UTI's in the last year.   ?Denies history of blood in urine and kidney or bladder stones ? ?Pelvic Organ Prolapse Symptoms:                  ?She Admits to a feeling of a bulge the vaginal area.  ?This bulge is bothersome. ?Has a  history of ? prolapse repair with her hysterectomy- unsure of type of procedure. Did not use mesh that she is aware of.  ? ?Bowel Symptom: ?Bowel movements: 2 time(s) per day ?Stool consistency: soft  ?Straining: no.  ?Splinting: no.  ?Incomplete evacuation: no.  ?She Denies accidental bowel leakage / fecal incontinence ?Bowel regimen: none ? ? ?Sexual Function ?Sexually active: yes.  ?Pain with sex: Yes, deep in the pelvis, has discomfort due to prolapse ? ?Pelvic Pain ?Denies pelvic pain ? ? ? ?Past Medical History:  ?Past Medical History:  ?Diagnosis Date  ? ADD (attention deficit disorder)   ? Anxiety   ? C. difficile colitis   ? Depression   ? Depression   ? Female bladder prolapse   ? HPV in female   ? Hyperlipidemia   ? Osteopenia   ? Thyroid disease   ? Vaginal Pap smear, abnormal   ? Vitamin D deficiency   ? ? ? ?Past Surgical History:   ?Past Surgical History:  ?Procedure Laterality Date  ? ABDOMINAL HYSTERECTOMY    ? AUGMENTATION MAMMAPLASTY Bilateral   ? BLADDER SUSPENSION    ? COLONOSCOPY    ? [facility]  ? COLPOSCOPY    ? LAPAROTOMY    ? LEEP    ? ? ? ?Past OB/GYN History: ?OB History  ?Gravida Para Term Preterm AB Living  ?3 3 3  3  ?SAB IAB Ectopic Multiple Live Births  ?        3  ?  ?# Outcome Date GA Lbr Len/2nd Weight Sex Delivery Anes PTL Lv  ?3 Term      Vag-Spont     ?2 Term      Vag-Spont     ?1 Term      Vag-Spont     ? ?S/p abdominal hysterectomy in 2004 ? ? ?Medications: She has a current medication list which includes the following prescription(s): alprazolam, dicyclomine, fluoxetine, nystatin cream, vitamin d (ergocalciferol), amphetamine-dextroamphetamine, and [START ON 02/12/2022] amphetamine-dextroamphetamine.  ? ?Allergies: Patient is allergic to e-mycin [erythromycin], lexapro [escitalopram oxalate], augmentin [amoxicillin-pot clavulanate], lactose intolerance (gi), covid-19 mrna vaccine (pfizer) [covid-19 mrna vacc (moderna)], and gadolinium.  ? ?Social  History:  ?Social History  ? ?Tobacco Use  ? Smoking status: Former  ?  Types: Cigarettes  ?  Quit date: 08/25/2004  ?  Years since quitting: 17.3  ? Smokeless tobacco: Never  ?Vaping Use  ? Vaping Use: Never used  ?Substance Use Topics  ? Alcohol use: Not Currently  ?  Comment: 1 drink a week/socail  ? Drug use: No  ? ? ?Relationship status: married ?She lives with husband.   ?She is employed as a Pharmacist, hospital. ?Regular exercise: No ?History of abuse: No ? ?Family History:   ?Family History  ?Problem Relation Age of Onset  ? Uterine cancer Maternal Grandmother   ? Breast cancer Maternal Grandmother   ? Alcoholism Other   ?     granparents  ? Depression Other   ?     aunts  ? Colon cancer Neg Hx   ? Esophageal cancer Neg Hx   ? ? ? ?Review of Systems: Review of Systems  ?Constitutional:  Negative for fever, malaise/fatigue and weight loss.  ?Respiratory:  Negative for cough, shortness of breath and wheezing.   ?Cardiovascular:  Negative for chest pain, palpitations and leg swelling.  ?Gastrointestinal:  Negative for abdominal pain and blood in stool.  ?Genitourinary:  Negative for dysuria.  ?Musculoskeletal:  Negative for myalgias.  ?Skin:  Negative for rash.  ?Neurological:  Negative for dizziness and headaches.  ?Endo/Heme/Allergies:  Does not bruise/bleed easily.  ?Psychiatric/Behavioral:  Negative for depression. The patient is not nervous/anxious.   ? ? ?OBJECTIVE ?Physical Exam: ?Vitals:  ? 01/12/22 1536  ?BP: 128/79  ?Pulse: 70  ?Weight: 150 lb (68 kg)  ?Height: '5\' 9"'$  (1.753 m)  ? ? ?Physical Exam ?Constitutional:   ?   General: She is not in acute distress. ?Pulmonary:  ?   Effort: Pulmonary effort is normal.  ?Abdominal:  ?   General: There is no distension.  ?   Palpations: Abdomen is soft.  ?   Tenderness: There is no abdominal tenderness. There is no rebound.  ?Musculoskeletal:     ?   General: No swelling. Normal range of motion.  ?Skin: ?   General: Skin is warm and dry.  ?   Findings: No rash.   ?Neurological:  ?   Mental Status: She is alert and oriented to person, place, and time.  ?Psychiatric:     ?   Mood and Affect: Mood normal.     ?   Behavior: Behavior normal.  ? ? ? ?GU / Detailed Urogynecologic Evaluation:  ?Pelvic Exam: Normal external female genitalia; Bartholin's and Skene's glands normal in appearance; urethral meatus normal in appearance, no urethral masses or discharge.  ? ?CST: negative ? ?s/p hysterectomy:  Speculum exam reveals normal vaginal mucosa with  atrophy and normal vaginal cuff.  Adnexa no mass, fullness, tenderness.   ? ? ?Pelvic floor strength II/V ? ?Pelvic floor musculature: Right levator non-tender, Right obturator non-tender, Left levator non-tender, Left obturator non-tender ? ?POP-Q:  ? ?POP-Q ? ?-0.5  ?                                          Aa   ?-0.5 ?                                          Ba  ?-6.5  ?                                            C  ? ?4.5  ?                                          Gh  ?4  ?                                          Pb  ?7  ?                                          tvl  ? ?-3  ?                                          Ap  ?-3  ?                                          Bp  ?   ?                                            D  ? ? ? ?Rectal Exam:  ?Small subcentimeter cyst vs hemorrhoid at anal verge on the left ? ?Post-Void Residual (PVR) by Bladder Scan: ?In order to evaluate bladder emptying, we discussed obtaining a postvoid residual and she agreed to this procedure. ? ?Procedure: The ultrasound unit was placed on the patient's abdomen in the suprapubic region after the patient had voided. A PVR of 16 ml was obtained by bladder scan. ? ?Laboratory Results: ?Unable to provide urine sample ? ?ASSESSMENT AND PLAN ?Ms. Maravilla is a 56 y.o. with:  ?1. Prolapse of anterior vaginal wall   ?2. Vaginal vault prolapse after hysterectomy   ?3. SUI (stress urinary incontinence, female)   ?4. Urinary frequency   ?5. Ovarian mass   ? ? ?Stage II  anterior, Stage I posterior, Stage I apical prolapse ?- She is already planning surgery for ovarian mass removal. She is interested in concurrent surgical repair of prolapse.  ?- We reviewed that she should plan

## 2022-01-13 ENCOUNTER — Encounter: Payer: Self-pay | Admitting: Obstetrics and Gynecology

## 2022-01-20 ENCOUNTER — Other Ambulatory Visit (HOSPITAL_COMMUNITY): Payer: Self-pay

## 2022-01-20 ENCOUNTER — Telehealth: Payer: Self-pay

## 2022-01-20 NOTE — Telephone Encounter (Addendum)
Initiated Prior authorization FQM:[id number] ER '15MG'$  er capsules ?Via: Covermymeds ?Case/Key:[id number] ?Status: approved as of 01/20/22 ?Reason:this request is approved for the following time period: ?01/20/2022 - 01/20/2025 ?Notified Pt via: Mychart ?

## 2022-02-09 ENCOUNTER — Other Ambulatory Visit: Payer: Self-pay | Admitting: Physician Assistant

## 2022-02-09 ENCOUNTER — Other Ambulatory Visit (HOSPITAL_COMMUNITY): Payer: Self-pay

## 2022-02-09 DIAGNOSIS — E559 Vitamin D deficiency, unspecified: Secondary | ICD-10-CM

## 2022-02-10 ENCOUNTER — Other Ambulatory Visit (HOSPITAL_COMMUNITY): Payer: Self-pay

## 2022-02-10 MED ORDER — VITAMIN D (ERGOCALCIFEROL) 1.25 MG (50000 UNIT) PO CAPS
50000.0000 [IU] | ORAL_CAPSULE | ORAL | 3 refills | Status: DC
  Filled 2022-02-10: qty 12, 84d supply, fill #0
  Filled 2022-04-14: qty 12, 84d supply, fill #1

## 2022-02-14 ENCOUNTER — Encounter: Payer: Self-pay | Admitting: Obstetrics and Gynecology

## 2022-02-18 ENCOUNTER — Encounter: Payer: Self-pay | Admitting: Obstetrics and Gynecology

## 2022-02-18 ENCOUNTER — Ambulatory Visit (INDEPENDENT_AMBULATORY_CARE_PROVIDER_SITE_OTHER): Payer: BC Managed Care – PPO | Admitting: Obstetrics and Gynecology

## 2022-02-18 VITALS — BP 115/80 | HR 90

## 2022-02-18 DIAGNOSIS — N393 Stress incontinence (female) (male): Secondary | ICD-10-CM | POA: Diagnosis not present

## 2022-02-18 DIAGNOSIS — N8110 Cystocele, unspecified: Secondary | ICD-10-CM | POA: Diagnosis not present

## 2022-02-18 DIAGNOSIS — N993 Prolapse of vaginal vault after hysterectomy: Secondary | ICD-10-CM

## 2022-02-18 NOTE — Progress Notes (Signed)
Jacqueline Hughes ?Return Visit ? ?SUBJECTIVE  ?History of Present Illness: ?Jacqueline Hughes is a 56 y.o. female seen in follow-up to discuss surgery and for simple CMG.  ? ? ?Past Medical History: ?Patient  has a past medical history of ADD (attention deficit disorder), Anxiety, C. difficile colitis, Depression, Depression, Female bladder prolapse, HPV in female, Hyperlipidemia, Osteopenia, Thyroid disease, Vaginal Pap smear, abnormal, and Vitamin D deficiency.  ? ?Past Surgical History: ?She  has a past surgical history that includes Abdominal hysterectomy; laparotomy; Augmentation mammaplasty (Bilateral); LEEP; Colposcopy; Bladder suspension; and Colonoscopy.  ? ?Medications: ?She has a current medication list which includes the following prescription(s): alprazolam, amphetamine-dextroamphetamine, amphetamine-dextroamphetamine, dicyclomine, fluoxetine, nystatin cream, vitamin d (ergocalciferol), and vitamin d (ergocalciferol).  ? ?Allergies: ?Patient is allergic to e-mycin [erythromycin], lexapro [escitalopram oxalate], augmentin [amoxicillin-pot clavulanate], lactose intolerance (gi), covid-19 mrna vaccine (pfizer) [covid-19 mrna vacc (moderna)], and gadolinium.  ? ?Social History: ?Patient  reports that she quit smoking about 17 years ago. Her smoking use included cigarettes. She has never used smokeless tobacco. She reports that she does not currently use alcohol. She reports that she does not use drugs.  ?  ?  ?OBJECTIVE  ?  ? ?Physical Exam: ?Vitals:  ? 02/18/22 1446  ?BP: 115/80  ?Pulse: 90  ? ?Gen: No apparent distress, A&O x 3. ? ?Detailed Urogynecologic Evaluation:  ?Deferred. Prior exam showed: ? ?POP-Q:  ?  ?POP-Q ?  ?-0.5  ?                                          Aa   ?-0.5 ?                                          Ba   ?-6.5  ?                                            C  ?  ?4.5  ?                                          Gh   ?4  ?                                          Pb   ?7  ?                                           tvl  ?  ?-3  ?                                          Ap   ?-3  ?  Bp   ?   ?                                            D  ?  ?   ?Verbal consent was obtained to perform simple CMG procedure:  ? ?Prolapse was reduced using 2 large cotton swabs. Urethra was prepped with betadine and a 17F catheter was placed and bladder was drained completely- drained 490m (after she had recently voided). The bladder was then backfilled with sterile water by gravity.  ?First sensation: 150 ?First Desire: 180 ?Strong Desire: 400 ?Capacity: stopped filling at 5041m?Cough stress test was positive in the standing position.  Valsalva stress test was negative. No DO She was was allowed to void on her own. Bladder scan was performed and PVR was 39m64m ? ?Interpretation: ?CMG showed normal sensation, and normal cystometric capacity. Findings positive for stress incontinence, negative for detrusor overactivity.  ? ? ? ?ASSESSMENT AND PLAN  ?  ?[name] a 55 94o. with:  ?1. SUI (stress urinary incontinence, female)   ?2. Prolapse of anterior vaginal wall   ?3. Vaginal vault prolapse after hysterectomy   ? ?- Although her simple CMG was positive for SUI, but she had voided right before the appointment and had 475m37m her bladder. She states she has been having more trouble emptying recently and having more bladder urgency. For this reason, we discussed holding off on an anti-incontinence procedure and reassess her leakage and emptying after prolapse repair. We discussed the option of an interval repair.  ? ?Plan for surgery: Exam under anesthesia, laparoscopic BSO (with Dr MillSabra Heckobotic assisted sacrocolpopexy, possible vaginal native tissue repair if robotic prolapse repair not possible ? ?- We reviewed the patient's specific anatomic and functional findings, with the assistance of diagrams, and together finalized the above procedure. The planned surgical  procedures were discussed along with the surgical risks outlined below, which were also provided on a detailed handout. Additional treatment options including expectant management, conservative management, medical management were discussed where appropriate.  We reviewed the benefits and risks of each treatment option.  ? ? ?- For preop Visit:  She is required to have a visit within 30 days of her surgery.  ? ?- Medical clearance: not required  ?- Anticoagulant use: No ?- Medicaid Hysterectomy form: n/a ?- Accepts blood transfusion: Yes ?- Expected length of stay: outpatient ? ?Request sent for surgery scheduling.  ? ?MichJaquita Folds ? ? ?

## 2022-02-28 ENCOUNTER — Ambulatory Visit (HOSPITAL_BASED_OUTPATIENT_CLINIC_OR_DEPARTMENT_OTHER): Payer: BC Managed Care – PPO | Admitting: Obstetrics & Gynecology

## 2022-02-28 ENCOUNTER — Encounter (HOSPITAL_BASED_OUTPATIENT_CLINIC_OR_DEPARTMENT_OTHER): Payer: Self-pay | Admitting: Obstetrics & Gynecology

## 2022-02-28 VITALS — BP 112/70 | Ht 68.0 in | Wt 154.2 lb

## 2022-02-28 DIAGNOSIS — R1900 Intra-abdominal and pelvic swelling, mass and lump, unspecified site: Secondary | ICD-10-CM

## 2022-02-28 DIAGNOSIS — Z90710 Acquired absence of both cervix and uterus: Secondary | ICD-10-CM

## 2022-02-28 DIAGNOSIS — Z8619 Personal history of other infectious and parasitic diseases: Secondary | ICD-10-CM

## 2022-02-28 DIAGNOSIS — N3946 Mixed incontinence: Secondary | ICD-10-CM | POA: Diagnosis not present

## 2022-02-28 DIAGNOSIS — Z780 Asymptomatic menopausal state: Secondary | ICD-10-CM | POA: Diagnosis not present

## 2022-02-28 MED ORDER — ESTRADIOL 0.1 MG/GM VA CREA: TOPICAL_CREAM | VAGINAL | 6 refills | Status: DC

## 2022-03-02 NOTE — Progress Notes (Signed)
GYNECOLOGY  VISIT ? ?CC:   discuss treatment for left adnexal mass ? ?HPI: ?56 y.o. G27P3003 Married White or Caucasian female here for discussion of treatment options for left adnexal mass measuring 8.1 x 6.1 x 5.3cm.  as well there is a 1.7cm x 1.5cm x 1.2cm cyst in the right adnexa.  Pt has undergone ultrasound and MRI.  I have personally reviewed the results and the ultrasound images from 10/24/2021.  These were done in the ER when pt was seen after having c difficule colitis infection after antibiotic use (Augmentin for sinus infection).  Tumor markers have been obtained.  Ca-125 was normal.  The only one that wasn't fully within the normal range was ca19-9 which was 35 and cut-off is 35.  Colonoscopy testing has been done in [place].  Pt reports there may have been one colonoscopy with a polyp.  Pt was recommended not to have this done for 6 months after her c diff infection. ? ?H/o TAH in [place] due to bleeding issues.   ? ?Patient Active Problem List  ? Diagnosis Date Noted  ? Ovarian mass, left 11/05/2021  ? History of Clostridioides difficile colitis 11/03/2021  ? C. difficile colitis 10/20/2021  ? Thyroid disease   ? S/P bilateral breast implants 01/17/2020  ? Muscle spasm 03/18/2019  ? Stress at work 08/07/2018  ? Grief reaction with prolonged bereavement 08/02/2018  ? Elevated LDL cholesterol level 03/07/2018  ? ADD (attention deficit disorder) 03/05/2018  ? Memory changes 03/05/2018  ? Word finding difficulty 03/05/2018  ? Varicose vein of leg 06/17/2017  ? Primary insomnia 06/17/2017  ? Cervical herniated disc 06/17/2017  ? Recurrent cold sores 01/20/2016  ? Osteopenia 08/16/2015  ? Food intolerance 08/29/2014  ? Adult ADHD 08/25/2014  ? Depression 08/25/2014  ? Vitamin D deficiency 08/25/2014  ? Dyslexia 08/25/2014  ? ? ?Past Medical History:  ?Diagnosis Date  ? Abnormal Pap smear of cervix   ? prior to hysterectomy, pap smears normal after hysterectomy  ? ADD (attention deficit disorder)   ?  Anxiety   ? C. difficile colitis   ? Depression   ? Female bladder prolapse   ? Genital warts   ? Hyperlipidemia   ? Osteopenia   ? Thyroid disease   ? Vitamin D deficiency   ? ? ?Past Surgical History:  ?Procedure Laterality Date  ? ABDOMINAL HYSTERECTOMY  2003  ? bleeding issues, in [place]  ? AUGMENTATION MAMMAPLASTY Bilateral   ? BLADDER SUSPENSION    ? COLONOSCOPY    ? 2008 [facility] hospital  ? COLPOSCOPY    ? LAPAROTOMY    ? LEEP    ? ? ?MEDS:   ?Current Outpatient Medications on File Prior to Visit  ?Medication Sig Dispense Refill  ? ALPRAZolam (XANAX) 0.5 MG tablet Take 1 tablet (0.5 mg total) by mouth at bedtime as needed for anxiety. 20 tablet 3  ? amphetamine-dextroamphetamine (ADDERALL XR) 15 MG 24 hr capsule Take 1 capsule by mouth every morning. 30 capsule 0  ? amphetamine-dextroamphetamine (ADDERALL XR) 15 MG 24 hr capsule Take 1 capsule by mouth every morning.  **02/12/22 30 capsule 0  ? dicyclomine (BENTYL) 10 MG capsule TAKE 1 CAPSULE (10 MG TOTAL) BY MOUTH EVERY 6 (SIX) HOURS AS NEEDED FOR SPASMS. 30 capsule 1  ? FLUoxetine (PROZAC) 20 MG capsule Take 1 capsule (20 mg total) by mouth daily. 90 capsule 3  ? nystatin cream (MYCOSTATIN) Apply 1 application topically 2 (two) times daily. 30 g  1  ? Vitamin D, Ergocalciferol, (DRISDOL) 1.25 MG (50000 UNIT) CAPS capsule Take 1 capsule (50,000 Units total) by mouth every 7 (seven) days. 12 capsule 3  ? ?No current facility-administered medications on file prior to visit.  ? ? ?ALLERGIES: E-mycin [erythromycin], Lexapro [escitalopram oxalate], Augmentin [amoxicillin-pot clavulanate], Lactose intolerance (gi), Covid-19 mrna vaccine (pfizer) [covid-19 mrna vacc (moderna)], and Gadolinium ? ?Family History  ?Problem Relation Age of Onset  ? Uterine cancer Maternal Grandmother   ? Breast cancer Maternal Grandmother   ? Alcoholism Other   ?     granparents  ? Depression Other   ?     aunts  ? Colon cancer Neg Hx   ? Esophageal cancer Neg Hx    ? ? ?SH:  married, non smoker ? ?Review of Systems  ?All other systems reviewed and are negative. ? ?PHYSICAL EXAMINATION:   ? ?BP 112/70 (BP Location: Left Arm, Patient Position: Sitting, Cuff Size: Small)   Ht '5\' 8"'$  (1.727 m)   Wt 154 lb 3.2 oz (69.9 kg)   BMI 23.45 kg/m?     ?General appearance: alert, cooperative and appears stated age ?Abdomen: soft, non-tender; bowel sounds normal; no masses,  no organomegaly ?Lymph:  no inguinal LAD noted ? ?Pelvic: External genitalia:  no lesions ?             Urethra:  normal appearing urethra with no masses, tenderness or lesions ?             Bartholins and Skenes: normal    ?             Vagina: normal appearing vagina with normal color and discharge, no lesions ?             Cervix: absent ?             Bimanual Exam:  Uterus:  uterus absent ?             Adnexa: no mass, fullness, tenderness (pt aware I cannot feel mass today) ? ?Chaperone, Britt Bottom, CMA, was present for exam. ? ?Assessment/Plan: ?1. Pelvic mass in female ?- removal recommended.  Pt desires for both ovaries and tubes to be removed.  Does not want to have another surgery in the future because one ovary was left.  Procedure discussed with patient.  Hospital stay, recovery and pain management all discussed.  Risks discussed including but not limited to bleeding, 1% risk of receiving a  transfusion, infection, 3-4% risk of bowel/bladder/ureteral/vascular injury discussed as well as possible need for additional surgery if injury does occur discussed.  DVT/PE and rare risk of death discussed.  My actual complications with prior surgeries discussed.  Vaginal cuff dehiscence discussed.  Hernia formation discussed.  Positioning and incision locations discussed.  Patient aware if pathology abnormal she may need additional treatment.  All questions answered.   ? ?2. Postmenopausal state ?- no HRT ? ?3. Mixed urge and stress incontinence ?- seeing Dr. Wannetta Sender ? ?4. H/O abdominal hysterectomy ?- in her  64's in [place] due to bleeding ? ?5. History of Clostridioides difficile colitis ?- followed now by Dr. Bryan Lemma   ? ? ?

## 2022-03-22 ENCOUNTER — Other Ambulatory Visit (HOSPITAL_COMMUNITY): Payer: Self-pay

## 2022-04-14 ENCOUNTER — Other Ambulatory Visit: Payer: Self-pay | Admitting: Physician Assistant

## 2022-04-14 ENCOUNTER — Other Ambulatory Visit (HOSPITAL_COMMUNITY): Payer: Self-pay

## 2022-04-14 DIAGNOSIS — F909 Attention-deficit hyperactivity disorder, unspecified type: Secondary | ICD-10-CM

## 2022-04-14 NOTE — Telephone Encounter (Signed)
Last written 02/12/2022 Last appt 11/03/2021

## 2022-04-15 ENCOUNTER — Other Ambulatory Visit (HOSPITAL_COMMUNITY): Payer: Self-pay

## 2022-04-15 ENCOUNTER — Telehealth: Payer: Self-pay

## 2022-04-15 MED ORDER — AMPHETAMINE-DEXTROAMPHET ER 15 MG PO CP24
15.0000 mg | ORAL_CAPSULE | ORAL | 0 refills | Status: DC
  Filled 2022-04-15 (×2): qty 30, 30d supply, fill #0

## 2022-04-15 NOTE — Telephone Encounter (Signed)
[facility] called and left a message stating Jacqueline Hughes was going out of town and needs a early refill on Adderall.

## 2022-04-15 NOTE — Telephone Encounter (Signed)
Pharmacy advised  

## 2022-04-15 NOTE — Telephone Encounter (Signed)
Ok for early refill 

## 2022-04-16 ENCOUNTER — Other Ambulatory Visit: Payer: Self-pay

## 2022-04-16 ENCOUNTER — Emergency Department (INDEPENDENT_AMBULATORY_CARE_PROVIDER_SITE_OTHER): Payer: BC Managed Care – PPO

## 2022-04-16 ENCOUNTER — Emergency Department
Admission: EM | Admit: 2022-04-16 | Discharge: 2022-04-16 | Disposition: A | Discharge status: Home / Self Care | Payer: BC Managed Care – PPO | Source: Home / Self Care

## 2022-04-16 DIAGNOSIS — S9032XA Contusion of left foot, initial encounter: Secondary | ICD-10-CM

## 2022-04-16 DIAGNOSIS — M79672 Pain in left foot: Secondary | ICD-10-CM | POA: Diagnosis not present

## 2022-04-16 NOTE — Discharge Instructions (Signed)
No fracture noted on xray  Wear ankle brace  Can take ibuprofen as needed Ice and elevated Follow up with orthopedics if no improvement

## 2022-04-16 NOTE — ED Provider Notes (Signed)
[facility]    CSN: [id number] Arrival date & time: 04/16/22  1337      History   Chief Complaint Chief Complaint  Patient presents with   Foot Injury    HPI Jacqueline Hughes is a 56 y.o. female.   Pt complains of left foot pain after she reports rolling her ankle on 6/8.  She reports since that time she has had continued left foot pain, worse with ambulation.  She previously broke her right foot, but walked on it for one week before getting it evaluation.  She is worried her left foot may be broken and she is going out of town tomorrow.  She has been taking ibuprofen with minimal relief.  She is experiencing some left foot swelling.     Past Medical History:  Diagnosis Date   Abnormal Pap smear of cervix    prior to hysterectomy, pap smears normal after hysterectomy   ADD (attention deficit disorder)    Anxiety    C. difficile colitis    Depression    Female bladder prolapse    Genital warts    Hyperlipidemia    Osteopenia    Thyroid disease    Vitamin D deficiency     Patient Active Problem List   Diagnosis Date Noted   Ovarian mass, left 11/05/2021   History of Clostridioides difficile colitis 11/03/2021   C. difficile colitis 10/20/2021   Thyroid disease    S/P bilateral breast implants 01/17/2020   Muscle spasm 03/18/2019   Stress at work 08/07/2018   Grief reaction with prolonged bereavement 08/02/2018   Elevated LDL cholesterol level 03/07/2018   ADD (attention deficit disorder) 03/05/2018   Memory changes 03/05/2018   Word finding difficulty 03/05/2018   Varicose vein of leg 06/17/2017   Primary insomnia 06/17/2017   Cervical herniated disc 06/17/2017   Recurrent cold sores 01/20/2016   Osteopenia 08/16/2015   Food intolerance 08/29/2014   Adult ADHD 08/25/2014   Depression 08/25/2014   Vitamin D deficiency 08/25/2014   Dyslexia 08/25/2014    Past Surgical History:  Procedure Laterality Date   ABDOMINAL HYSTERECTOMY  2003   bleeding  issues, in [place] Bilateral    BLADDER SUSPENSION     COLONOSCOPY     2008 [place] [facility]   COLPOSCOPY     LAPAROTOMY     LEEP      OB History     Gravida  3   Para  3   Term  3   Preterm      AB      Living  3      SAB      IAB      Ectopic      Multiple      Live Births  3            Home Medications    Prior to Admission medications   Medication Sig Start Date End Date Taking? Authorizing Provider  ibuprofen (ADVIL) 400 MG tablet Take 400 mg by mouth every 6 (six) hours as needed.   Yes [provider]  ALPRAZolam Duanne Moron) 0.5 MG tablet Take 1 tablet (0.5 mg total) by mouth at bedtime as needed for anxiety. 03/18/19   Breeback, Jade L, PA-C  amphetamine-dextroamphetamine (ADDERALL XR) 15 MG 24 hr capsule Take 1 capsule by mouth every morning. 02/12/22   Breeback, Jade L, PA-C  amphetamine-dextroamphetamine (ADDERALL XR) 15 MG 24 hr capsule Take 1  capsule by mouth every morning. 04/15/22   Breeback, Jade L, PA-C  dicyclomine (BENTYL) 10 MG capsule TAKE 1 CAPSULE (10 MG TOTAL) BY MOUTH EVERY 6 (SIX) HOURS AS NEEDED FOR SPASMS. 12/20/21   Cirigliano, Vito V, DO  estradiol (ESTRACE) 0.1 MG/GM vaginal cream 1 gram vaginally twice weekly 02/28/22   Megan Salon, MD  FLUoxetine (PROZAC) 20 MG capsule Take 1 capsule (20 mg total) by mouth daily. 09/21/21   Breeback, [name] L, PA-C  nystatin cream (MYCOSTATIN) Apply 1 application topically 2 (two) times daily. 11/03/21   Breeback, Royetta Car, PA-C  Vitamin D, Ergocalciferol, (DRISDOL) 1.25 MG (50000 UNIT) CAPS capsule Take 1 capsule (50,000 Units total) by mouth every 7 (seven) days. 02/10/22   Donella Stade, PA-C    Family History Family History  Problem Relation Age of Onset   Thyroid disease Mother    Cancer Mother    Osteopenia Mother    Uterine cancer Maternal Grandmother    Breast cancer Maternal Grandmother    Alcoholism Other        granparents    Depression Other        aunts   Colon cancer Neg Hx    Esophageal cancer Neg Hx     Social History Social History   Tobacco Use   Smoking status: Former    Types: Cigarettes    Quit date: 08/25/2004    Years since quitting: 17.6    Passive exposure: Never   Smokeless tobacco: Never  Vaping Use   Vaping Use: Never used  Substance Use Topics   Alcohol use: Yes    Comment: occasionally   Drug use: No     Allergies   E-mycin [erythromycin], Lexapro [escitalopram oxalate], Augmentin [amoxicillin-pot clavulanate], Lactose intolerance (gi), Covid-19 mrna vaccine (pfizer) [covid-19 mrna vacc (moderna)], and Gadolinium   Review of Systems Review of Systems  Constitutional:  Negative for chills and fever.  HENT:  Negative for ear pain and sore throat.   Eyes:  Negative for pain and visual disturbance.  Respiratory:  Negative for cough and shortness of breath.   Cardiovascular:  Negative for chest pain and palpitations.  Gastrointestinal:  Negative for abdominal pain and vomiting.  Genitourinary:  Negative for dysuria and hematuria.  Musculoskeletal:  Positive for arthralgias (left foot pain). Negative for back pain.  Skin:  Negative for color change and rash.  Neurological:  Negative for seizures and syncope.  All other systems reviewed and are negative.    Physical Exam Triage Vital Signs ED Triage Vitals  Enc Vitals Group     BP 04/16/22 1405 120/84     Pulse Rate 04/16/22 1405 79     Resp 04/16/22 1405 20     Temp 04/16/22 1405 98.3 F (36.8 C)     Temp Source 04/16/22 1405 Oral     SpO2 04/16/22 1405 100 %     Weight 04/16/22 1359 150 lb (68 kg)     Height 04/16/22 1359 5' 8.5" (1.74 m)     Head Circumference --      Peak Flow --      Pain Score 04/16/22 1357 3     Pain Loc --      Pain Edu? --      Excl. in [place]? --    No data found.  Updated Vital Signs BP 120/84 (BP Location: Right Arm)   Pulse 79   Temp 98.3 F (36.8 C) (Oral)   Resp 20   Ht  5'  8.5" (1.74 m)   Wt 150 lb (68 kg)   SpO2 100%   BMI 22.48 kg/m   Visual Acuity Right Eye Distance:   Left Eye Distance:   Bilateral Distance:    Right Eye Near:   Left Eye Near:    Bilateral Near:     Physical Exam Vitals and nursing note reviewed.  Constitutional:      General: She is not in acute distress.    Appearance: She is well-developed.  HENT:     Head: Normocephalic and atraumatic.  Eyes:     Conjunctiva/sclera: Conjunctivae normal.  Cardiovascular:     Rate and Rhythm: Normal rate and regular rhythm.     Heart sounds: No murmur heard. Pulmonary:     Effort: Pulmonary effort is normal. No respiratory distress.     Breath sounds: Normal breath sounds.  Abdominal:     Palpations: Abdomen is soft.     Tenderness: There is no abdominal tenderness.  Musculoskeletal:        General: No swelling.     Cervical back: Neck supple.     Comments: No tenderness to lateral or medial malleolus.  Tenderness to dorsal lateral foot, mild swelling.   Skin:    General: Skin is warm and dry.     Capillary Refill: Capillary refill takes less than 2 seconds.  Neurological:     Mental Status: She is alert.  Psychiatric:        Mood and Affect: Mood normal.      UC Treatments / Results  Labs (all labs ordered are listed, but only abnormal results are displayed) Labs Reviewed - No data to display  EKG   Radiology DG Foot Complete Left  Result Date: 04/16/2022 CLINICAL DATA:  Left foot pain after injury 1 week ago. EXAM: LEFT FOOT - COMPLETE 3+ VIEW COMPARISON:  09/02/2020 FINDINGS: There is no evidence of fracture or dislocation. There is no evidence of arthropathy or other focal bone abnormality. Soft tissues are unremarkable. IMPRESSION: Negative. Electronically Signed   By: Marin Olp M.D.   On: 04/16/2022 14:22    Procedures Procedures (including critical care time)  Medications Ordered in UC Medications - No data to display  Initial Impression / Assessment  and Plan / UC Course  I have reviewed the triage vital signs and the nursing notes.  Pertinent labs & imaging results that were available during my care of the patient were reviewed by me and considered in my medical decision making (see chart for details).     Xrays negative for fracture.  Lace up ankle wrap given in clinic.  Advised continued ibuprofen as needed, ice and elevation.  Return precautions discussed.  Final Clinical Impressions(s) / UC Diagnoses   Final diagnoses:  Contusion of left foot, initial encounter     Discharge Instructions      No fracture noted on xray  Wear ankle brace  Can take ibuprofen as needed Ice and elevated Follow up with orthopedics if no improvement    ED Prescriptions   None    PDMP not reviewed this encounter.   Ward, Lenise Arena, PA-C 04/16/22 1459

## 2022-04-16 NOTE — ED Triage Notes (Addendum)
Pt presents to Urgent Care with c/o L lateral foot pain following an injury on 6/8 or 6/9 2023. States she rolled her L ankle/foot and nearly fell down. Pain has not improved w/ ibuprofen.

## 2022-04-22 ENCOUNTER — Telehealth (INDEPENDENT_AMBULATORY_CARE_PROVIDER_SITE_OTHER): Payer: BC Managed Care – PPO | Admitting: Obstetrics and Gynecology

## 2022-04-22 ENCOUNTER — Other Ambulatory Visit: Payer: Self-pay | Admitting: Obstetrics and Gynecology

## 2022-04-22 DIAGNOSIS — N8110 Cystocele, unspecified: Secondary | ICD-10-CM

## 2022-04-22 DIAGNOSIS — N838 Other noninflammatory disorders of ovary, fallopian tube and broad ligament: Secondary | ICD-10-CM

## 2022-04-22 DIAGNOSIS — Z01818 Encounter for other preprocedural examination: Secondary | ICD-10-CM

## 2022-04-22 DIAGNOSIS — N993 Prolapse of vaginal vault after hysterectomy: Secondary | ICD-10-CM

## 2022-04-22 DIAGNOSIS — N393 Stress incontinence (female) (male): Secondary | ICD-10-CM

## 2022-04-22 MED ORDER — IBUPROFEN 600 MG PO TABS: 600.0000 mg | ORAL_TABLET | Freq: Four times a day (QID) | ORAL | 0 refills | Status: DC | PRN

## 2022-04-22 MED ORDER — ACETAMINOPHEN 500 MG PO TABS: 500.0000 mg | ORAL_TABLET | Freq: Four times a day (QID) | ORAL | 0 refills | Status: DC | PRN

## 2022-04-22 MED ORDER — POLYETHYLENE GLYCOL 3350 17 GM/SCOOP PO POWD
17.0000 g | Freq: Every day | ORAL | 0 refills | Status: DC
Start: 2022-04-22 — End: 2022-06-15

## 2022-04-22 MED ORDER — ONDANSETRON HCL 4 MG PO TABS
4.0000 mg | ORAL_TABLET | Freq: Three times a day (TID) | ORAL | 0 refills | Status: DC | PRN
Start: 2022-04-22 — End: 2022-08-04

## 2022-04-22 MED ORDER — OXYCODONE HCL 5 MG PO TABS: 5.0000 mg | ORAL_TABLET | ORAL | 0 refills | Status: DC | PRN

## 2022-04-22 NOTE — Progress Notes (Signed)
[facility] Pre-Operative visit- video visit  Subjective Chief Complaint: Jacqueline Hughes presents for a preoperative encounter.   History of Present Illness: Jacqueline Hughes is a 56 y.o. female who presents for preoperative visit.  She is scheduled to undergo Exam under anesthesia, laparoscopic BSO, robotic assisted sacrocolpopexy, possible vaginal native tissue repair if robotic prolapse repair not possible on 05/10/22.  Her symptoms include vaginal bulge and mixed incontinence, and she was was found to have Stage II anterior, Stage I posterior, Stage I apical prolapse.  CMG Interpretation: CMG showed normal sensation, and normal cystometric capacity. Findings positive for stress incontinence, negative for detrusor overactivity. Prior to procedure, patient voided and PVR was .   Past Medical History:  Diagnosis Date   Abnormal Pap smear of cervix    prior to hysterectomy, pap smears normal after hysterectomy   ADD (attention deficit disorder)    Anxiety    C. difficile colitis    Depression    Female bladder prolapse    Genital warts    Hyperlipidemia    Osteopenia    Thyroid disease    Vitamin D deficiency      Past Surgical History:  Procedure Laterality Date   ABDOMINAL HYSTERECTOMY  2003   bleeding issues, in [place]   AUGMENTATION MAMMAPLASTY Bilateral    BLADDER SUSPENSION     COLONOSCOPY     2008 [facility] hospital   COLPOSCOPY     LAPAROTOMY     LEEP      is allergic to e-mycin [erythromycin], lexapro [escitalopram oxalate], augmentin [amoxicillin-pot clavulanate], lactose intolerance (gi), covid-19 mrna vaccine (pfizer) [covid-19 mrna vacc (moderna)], and gadolinium.   Family History  Problem Relation Age of Onset   Thyroid disease Mother    Cancer Mother    Osteopenia Mother    Uterine cancer Maternal Grandmother    Breast cancer Maternal Grandmother    Alcoholism Other        granparents   Depression Other        aunts    Colon cancer Neg Hx    Esophageal cancer Neg Hx     Social History   Tobacco Use   Smoking status: Former    Types: Cigarettes    Quit date: 08/25/2004    Years since quitting: 17.6    Passive exposure: Never   Smokeless tobacco: Never  Vaping Use   Vaping Use: Never used  Substance Use Topics   Alcohol use: Yes    Comment: occasionally   Drug use: No     Review of Systems was negative for a full 10 system review except as noted in the History of Present Illness.   Current Outpatient Medications:    ALPRAZolam (XANAX) 0.5 MG tablet, Take 1 tablet (0.5 mg total) by mouth at bedtime as needed for anxiety., Disp: 20 tablet, Rfl: 3   amphetamine-dextroamphetamine (ADDERALL XR) 15 MG 24 hr capsule, Take 1 capsule by mouth every morning., Disp: 30 capsule, Rfl: 0   amphetamine-dextroamphetamine (ADDERALL XR) 15 MG 24 hr capsule, Take 1 capsule by mouth every morning., Disp: 30 capsule, Rfl: 0   dicyclomine (BENTYL) 10 MG capsule, TAKE 1 CAPSULE (10 MG TOTAL) BY MOUTH EVERY 6 (SIX) HOURS AS NEEDED FOR SPASMS., Disp: 30 capsule, Rfl: 1   estradiol (ESTRACE) 0.1 MG/GM vaginal cream, 1 gram vaginally twice weekly, Disp: 42.5 g, Rfl: 6   FLUoxetine (PROZAC) 20 MG capsule, Take 1 capsule (20 mg total) by mouth daily., Disp: 90  capsule, Rfl: 3   ibuprofen (ADVIL) 400 MG tablet, Take 400 mg by mouth every 6 (six) hours as needed., Disp: , Rfl:    nystatin cream (MYCOSTATIN), Apply 1 application topically 2 (two) times daily., Disp: 30 g, Rfl: 1   Vitamin D, Ergocalciferol, (DRISDOL) 1.25 MG (50000 UNIT) CAPS capsule, Take 1 capsule (50,000 Units total) by mouth every 7 (seven) days., Disp: 12 capsule, Rfl: 3   Objective No distress  Previous Pelvic Exam showed: POP-Q:    POP-Q   -0.5                                            Aa   -0.5                                           Ba   -6.5                                              C    4.5                                             Gh   4                                            Pb   7                                            tvl    -3                                            Ap   -3                                            Bp                                                  D        Assessment/ Plan  Assessment: The patient is a 56 y.o. year old scheduled to undergo Exam under anesthesia, laparoscopic BSO, robotic assisted sacrocolpopexy, possible vaginal native tissue repair if robotic prolapse repair not possible. Verbal consent was obtained for these procedures.  Plan: General Surgical Consent: The patient has previously been counseled on alternative treatments, and the decision by the patient and provider was to proceed with the procedure listed above.  For all procedures, there  are risks of bleeding, infection, damage to surrounding organs including but not limited to bowel, bladder, blood vessels, ureters and nerves, and need for further surgery if an injury were to occur. These risks are all low with minimally invasive surgery.   There are risks of numbness and weakness at any body site or buttock/rectal pain.  It is possible that baseline pain can be worsened by surgery, either with or without mesh. If surgery is vaginal, there is also a low risk of possible conversion to laparoscopy or open abdominal incision where indicated. Very rare risks include blood transfusion, blood clot, heart attack, pneumonia, or death.   There is also a risk of short-term postoperative urinary retention with need to use a catheter. About half of patients need to go home from surgery with a catheter, which is then later removed in the office. The risk of long-term need for a catheter is very low. There is also a risk of worsening of overactive bladder.   Prolapse (with or without mesh): Risk factors for surgical failure  include things that put pressure on your pelvis and the surgical repair, including obesity,  chronic cough, and heavy lifting or straining (including lifting children or adults, straining on the toilet, or lifting heavy objects such as furniture or anything weighing >25 lbs. Risks of recurrence is 20-30% with vaginal native tissue repair and a less than 10% with sacrocolpopexy with mesh.    Sacrocolpopexy: Mesh implants may provide more prolapse support, but do have some unique risks to consider. It is important to understand that mesh is permanent and cannot be easily removed. Risks of abdominal sacrocolpopexy mesh include mesh exposure (~3-6%), painful intercourse (recent studies show lower rates after surgery compared to before, with ~5-8% risk of new onset), and very rare risks of bowel or bladder injury or infection (<1%). The risk of mesh exposure is more likely in a woman with risks for poor healing (prior radiation, poorly controlled diabetes, or immunocompromised). The risk of new or worsened chronic pain after mesh implant is more common in women with baseline chronic pain and/or poorly controlled anxiety or depression. There is an FDA safety notification on vaginal mesh procedures for prolapse but NOT abdominal mesh procedures and therefore does not apply to your surgery. We have extensive experience and training with mesh placement and we have close postoperative follow up to identify any potential complications from mesh.    We discussed consent for blood products. Risks for blood transfusion include allergic reactions, other reactions that can affect different body organs and managed accordingly, transmission of infectious diseases such as HIV or Hepatitis. However, the blood is screened. Patient consents for blood products.  Pre-operative instructions:  She was instructed to not take Aspirin/NSAIDs x 7days prior to surgery.  Antibiotic prophylaxis was ordered as indicated.  Catheter use: Patient will go home with foley if needed after post-operative voiding trial.  Post-operative  instructions:  She was provided with specific post-operative instructions, including precautions and signs/symptoms for which we would recommend contacting us, in addition to daytime and after-hours contact phone numbers. This was provided on a handout.   Post-operative medications: Prescriptions for motrin, tylenol, miralax, and oxycodone were sent to her pharmacy. Discussed using ibuprofen and tylenol on a schedule to limit use of narcotics.   Laboratory testing:  type and screen  Preoperative clearance:  She does not require surgical clearance.    Post-operative follow-up:  A post-operative appointment will be made for 6 weeks from the date of  surgery. If she needs a post-operative nurse visit for a voiding trial, that will be set up after she leaves the hospital.    Patient will call the clinic or use MyChart should anything change or any new issues arise.   Marguerita Beards, MD

## 2022-04-27 NOTE — H&P (Signed)
[facility] Pre-Operative H&P  Subjective Chief Complaint: Jacqueline Hughes presents for a preoperative encounter.   History of Present Illness: Jacqueline Hughes is a 56 y.o. female who presents for preoperative visit.  She is scheduled to undergo Exam under anesthesia, laparoscopic BSO, robotic assisted sacrocolpopexy, possible vaginal native tissue repair if robotic prolapse repair not possible on 05/10/22.  Her symptoms include vaginal bulge and mixed incontinence, and she was was found to have Stage II anterior, Stage I posterior, Stage I apical prolapse.  CMG Interpretation: CMG showed normal sensation, and normal cystometric capacity. Findings positive for stress incontinence, negative for detrusor overactivity. Prior to procedure, patient voided and PVR was 419m.   MRI pelvis: solid mass in the left adnexa which measures 8.1 x 6.1 x 5.3 cm Tumor markers negative  Past Medical History:  Diagnosis Date   Abnormal Pap smear of cervix    prior to hysterectomy, pap smears normal after hysterectomy   ADD (attention deficit disorder)    Anxiety    C. difficile colitis    Depression    Female bladder prolapse    Genital warts    Hyperlipidemia    Osteopenia    Thyroid disease    Vitamin D deficiency      Past Surgical History:  Procedure Laterality Date   ABDOMINAL HYSTERECTOMY  2003   [place]    BLADDER SUSPENSION     [facility]   [facility]     LAPAROTOMY     LEEP      is allergic to e-mycin [erythromycin], lexapro [escitalopram oxalate], augmentin [amoxicillin-pot clavulanate], lactose intolerance (gi), covid-19 mrna vaccine (pfizer) [covid-19 mrna vacc (moderna)], and gadolinium.   Family History  Problem Relation Age of Onset   Thyroid disease Mother    Cancer Mother    Osteopenia Mother    Uterine cancer Maternal Grandmother    Breast cancer Maternal  Grandmother    Alcoholism Other        granparents   Depression Other        aunts   Colon cancer Neg Hx    Esophageal cancer Neg Hx     Social History   Tobacco Use   Smoking status: Former    Types: Cigarettes    Quit date: 08/25/2004    Years since quitting: 17.6    Passive exposure: Never   Smokeless tobacco: Never  Vaping Use   Vaping Use: Never used  Substance Use Topics   Alcohol use: Yes    Comment: occasionally   Drug use: No     Review of Systems was negative for a full 10 system review except as noted in the History of Present Illness.  No current facility-administered medications for this encounter.  Current Outpatient Medications:    acetaminophen (TYLENOL) 500 MG tablet, Take 1 tablet (500 mg total) by mouth every 6 (six) hours as needed (pain)., Disp: 30 tablet, Rfl: 0   ALPRAZolam (XANAX) 0.5 MG tablet, Take 1 tablet (0.5 mg total) by mouth at bedtime as needed for anxiety., Disp: 20 tablet, Rfl: 3   amphetamine-dextroamphetamine (ADDERALL XR) 15 MG 24 hr capsule, Take 1 capsule by mouth every morning., Disp: 30 capsule, Rfl: 0   amphetamine-dextroamphetamine (ADDERALL XR) 15 MG 24 hr capsule, Take 1 capsule by mouth every morning., Disp: 30 capsule, Rfl: 0   dicyclomine (BENTYL) 10 MG capsule, TAKE 1 CAPSULE (10 MG  TOTAL) BY MOUTH EVERY 6 (SIX) HOURS AS NEEDED FOR SPASMS., Disp: 30 capsule, Rfl: 1   estradiol (ESTRACE) 0.1 MG/GM vaginal cream, 1 gram vaginally twice weekly, Disp: 42.5 g, Rfl: 6   FLUoxetine (PROZAC) 20 MG capsule, Take 1 capsule (20 mg total) by mouth daily., Disp: 90 capsule, Rfl: 3   ibuprofen (ADVIL) 400 MG tablet, Take 400 mg by mouth every 6 (six) hours as needed., Disp: , Rfl:    ibuprofen (ADVIL) 600 MG tablet, Take 1 tablet (600 mg total) by mouth every 6 (six) hours as needed., Disp: 30 tablet, Rfl: 0   nystatin cream (MYCOSTATIN), Apply 1 application topically 2 (two) times daily., Disp: 30 g, Rfl: 1   ondansetron (ZOFRAN) 4 MG  tablet, Take 1 tablet (4 mg total) by mouth every 8 (eight) hours as needed for nausea or vomiting., Disp: 30 tablet, Rfl: 0   oxyCODONE (OXY IR/ROXICODONE) 5 MG immediate release tablet, Take 1 tablet (5 mg total) by mouth every 4 (four) hours as needed for severe pain., Disp: 10 tablet, Rfl: 0   polyethylene glycol powder (GLYCOLAX/MIRALAX) 17 GM/SCOOP powder, Take 17 g by mouth daily. Drink 17g (1 scoop) dissolved in water per day., Disp: 255 g, Rfl: 0   Vitamin D, Ergocalciferol, (DRISDOL) 1.25 MG (50000 UNIT) CAPS capsule, Take 1 capsule (50,000 Units total) by mouth every 7 (seven) days., Disp: 12 capsule, Rfl: 3   Objective No distress  Previous Pelvic Exam showed: POP-Q:    POP-Q   -0.5                                            Aa   -0.5                                           Ba   -6.5                                              C    4.5                                            Gh   4                                            Pb   7                                            tvl    -3                                            Ap   -3  Bp                                                  D        Assessment/ Plan  Assessment: The patient is a 56 y.o. year old with prolapse, and enlarged ovarian cyst scheduled to undergo Exam under anesthesia, laparoscopic BSO, robotic assisted sacrocolpopexy, possible vaginal native tissue repair if robotic prolapse repair not possible.   [name], MD

## 2022-04-28 ENCOUNTER — Encounter (HOSPITAL_BASED_OUTPATIENT_CLINIC_OR_DEPARTMENT_OTHER): Payer: Self-pay | Admitting: Obstetrics and Gynecology

## 2022-04-28 ENCOUNTER — Telehealth (INDEPENDENT_AMBULATORY_CARE_PROVIDER_SITE_OTHER): Payer: BC Managed Care – PPO | Admitting: Physician Assistant

## 2022-04-28 ENCOUNTER — Encounter: Payer: Self-pay | Admitting: Physician Assistant

## 2022-04-28 DIAGNOSIS — H9201 Otalgia, right ear: Secondary | ICD-10-CM

## 2022-04-28 DIAGNOSIS — J029 Acute pharyngitis, unspecified: Secondary | ICD-10-CM

## 2022-04-28 MED ORDER — PREDNISONE 20 MG PO TABS
40.0000 mg | ORAL_TABLET | Freq: Every day | ORAL | 0 refills | Status: DC
Start: 2022-04-28 — End: 2022-05-11

## 2022-04-28 NOTE — Progress Notes (Signed)
Ear and throat pain on rt side onset for 1 week no fever hurst to swallow pressure in rt ear  muffling

## 2022-04-28 NOTE — Progress Notes (Signed)
..  Virtual Visit via Video Note  I connected with Jacqueline Hughes on 04/28/22 at 11:10 AM EDT by a video enabled telemedicine application and verified that I am speaking with the correct person using two identifiers.  Location: Patient: home Provider: clinic  .Marland KitchenParticipating in visit:  Patient: Jacqueline Hughes Provider: Iran Planas PA-C   I discussed the limitations of evaluation and management by telemedicine and the availability of in person appointments. The patient expressed understanding and agreed to proceed.  History of Present Illness: Pt is a 56 yo female who is having right sided ear pain/pressure and right sided ST for the last week. She denies any fever, chills, body aches, sick contacts, SOB. She has taken some allergy medication and not helped much. She is out of town in [place]. Cough is not productive and more dry.   Pt does have upcoming salpingo oophorectomy on 7/11.     Observations/Objective: No acute distress Normal mood and appearance Normal breathing   Assessment and Plan: Marland KitchenMarland KitchenDiagnoses and all orders for this visit:  Right ear pain -     predniSONE (DELTASONE) 20 MG tablet; Take 2 tablets (40 mg total) by mouth daily with breakfast.  Sore throat -     predniSONE (DELTASONE) 20 MG tablet; Take 2 tablets (40 mg total) by mouth daily with breakfast.  No bacterial infection signs Suspect ETD due to allergies or the plane pressure changes and PND Start prednisone for 5 days Avoiding abx due to hx of C.diff Follow up as needed or if symptoms worsen or change Surgery scheduled for 7/11 should be ok to stop prednisone 7 days before surgery    Follow Up Instructions:    I discussed the assessment and treatment plan with the patient. The patient was provided an opportunity to ask questions and all were answered. The patient agreed with the plan and demonstrated an understanding of the instructions.   The patient was advised to call back or seek an in-person evaluation if  the symptoms worsen or if the condition fails to improve as anticipated.    Iran Planas, PA-C

## 2022-04-28 NOTE — Progress Notes (Signed)
Spoke w/ via phone for pre-op interview--- [place]----   T&S            Lab results------ COVID test -----patient states asymptomatic no test needed Arrive at -------0973 NPO after MN NO Solid Food.  Clear liquids from MN until---0915 Med rec completed Medications to take morning of surgery -----Prozac Diabetic medication ----- Patient instructed no nail polish to be worn day of surgery Patient instructed to bring photo id and insurance card day of surgery Patient aware to have Driver (ride ) / caregiver   Husband Corene Cornea for 24 hours after surgery  Patient Special Instructions ----- Pre-Op special Istructions ----- Patient verbalized understanding of instructions that were given at this phone interview. Patient denies shortness of breath, chest pain, fever, cough at this phone interview.

## 2022-04-29 ENCOUNTER — Other Ambulatory Visit: Payer: Self-pay | Admitting: Neurology

## 2022-04-29 MED ORDER — VALACYCLOVIR HCL 1 G PO TABS: 2000.0000 mg | ORAL_TABLET | Freq: Two times a day (BID) | ORAL | 0 refills | Status: DC

## 2022-05-05 NOTE — Telephone Encounter (Signed)
error 

## 2022-05-10 ENCOUNTER — Ambulatory Visit (HOSPITAL_BASED_OUTPATIENT_CLINIC_OR_DEPARTMENT_OTHER): Payer: BC Managed Care – PPO | Admitting: Anesthesiology

## 2022-05-10 ENCOUNTER — Encounter (HOSPITAL_BASED_OUTPATIENT_CLINIC_OR_DEPARTMENT_OTHER): Payer: Self-pay | Admitting: Obstetrics and Gynecology

## 2022-05-10 ENCOUNTER — Ambulatory Visit (HOSPITAL_BASED_OUTPATIENT_CLINIC_OR_DEPARTMENT_OTHER)
Admission: RE | Admit: 2022-05-10 | Discharge: 2022-05-11 | Disposition: A | Discharge status: Home / Self Care | Payer: BC Managed Care – PPO | Source: Ambulatory Visit | Attending: Obstetrics and Gynecology | Admitting: Obstetrics and Gynecology

## 2022-05-10 ENCOUNTER — Encounter (HOSPITAL_BASED_OUTPATIENT_CLINIC_OR_DEPARTMENT_OTHER)
Admission: RE | Disposition: A | Discharge status: Home / Self Care | Payer: Self-pay | Source: Ambulatory Visit | Attending: Obstetrics and Gynecology

## 2022-05-10 ENCOUNTER — Other Ambulatory Visit: Payer: Self-pay

## 2022-05-10 DIAGNOSIS — K660 Peritoneal adhesions (postprocedural) (postinfection): Secondary | ICD-10-CM | POA: Insufficient documentation

## 2022-05-10 DIAGNOSIS — N83202 Unspecified ovarian cyst, left side: Secondary | ICD-10-CM | POA: Diagnosis not present

## 2022-05-10 DIAGNOSIS — N8110 Cystocele, unspecified: Secondary | ICD-10-CM | POA: Diagnosis not present

## 2022-05-10 DIAGNOSIS — N993 Prolapse of vaginal vault after hysterectomy: Secondary | ICD-10-CM | POA: Diagnosis present

## 2022-05-10 DIAGNOSIS — N838 Other noninflammatory disorders of ovary, fallopian tube and broad ligament: Secondary | ICD-10-CM | POA: Diagnosis not present

## 2022-05-10 DIAGNOSIS — Z90710 Acquired absence of both cervix and uterus: Secondary | ICD-10-CM | POA: Insufficient documentation

## 2022-05-10 DIAGNOSIS — Z01818 Encounter for other preprocedural examination: Secondary | ICD-10-CM

## 2022-05-10 DIAGNOSIS — N839 Noninflammatory disorder of ovary, fallopian tube and broad ligament, unspecified: Secondary | ICD-10-CM

## 2022-05-10 DIAGNOSIS — Z87891 Personal history of nicotine dependence: Secondary | ICD-10-CM | POA: Diagnosis not present

## 2022-05-10 HISTORY — DX: Unspecified asthma, uncomplicated: J45.909

## 2022-05-10 HISTORY — DX: Other specified postprocedural states: Z98.890

## 2022-05-10 HISTORY — PX: CYSTOSCOPY: SHX5120

## 2022-05-10 HISTORY — PX: ROBOTIC ASSISTED SALPINGO OOPHERECTOMY: SHX6082

## 2022-05-10 HISTORY — PX: ROBOTIC ASSISTED LAPAROSCOPIC SACROCOLPOPEXY: SHX5388

## 2022-05-10 HISTORY — DX: Nausea with vomiting, unspecified: R11.2

## 2022-05-10 LAB — TYPE AND SCREEN
ABO/RH(D): O POS
Antibody Screen: NEGATIVE

## 2022-05-10 LAB — ABO/RH: ABO/RH(D): O POS

## 2022-05-10 SURGERY — SALPINGO-OOPHORECTOMY, ROBOT-ASSISTED
Anesthesia: General | Site: Pelvis

## 2022-05-10 MED ORDER — ROCURONIUM BROMIDE 10 MG/ML (PF) SYRINGE
PREFILLED_SYRINGE | INTRAVENOUS | Status: DC | PRN
  Administered 2022-05-10 (×3): 20 mg via INTRAVENOUS
  Administered 2022-05-10: 10 mg via INTRAVENOUS
  Administered 2022-05-10: 60 mg via INTRAVENOUS
  Administered 2022-05-10: 10 mg via INTRAVENOUS

## 2022-05-10 MED ORDER — FENTANYL CITRATE (PF) 100 MCG/2ML IJ SOLN
INTRAMUSCULAR | Status: AC
  Filled 2022-05-10: qty 2

## 2022-05-10 MED ORDER — FENTANYL CITRATE (PF) 100 MCG/2ML IJ SOLN: 25.0000 ug | INTRAMUSCULAR | Status: DC | PRN

## 2022-05-10 MED ORDER — PROPOFOL 1000 MG/100ML IV EMUL
INTRAVENOUS | Status: AC
  Filled 2022-05-10: qty 100

## 2022-05-10 MED ORDER — DEXAMETHASONE SODIUM PHOSPHATE 10 MG/ML IJ SOLN
INTRAMUSCULAR | Status: DC | PRN
  Administered 2022-05-10 (×2): 5 mg via INTRAVENOUS

## 2022-05-10 MED ORDER — PHENAZOPYRIDINE HCL 100 MG PO TABS
ORAL_TABLET | ORAL | Status: AC
  Filled 2022-05-10: qty 2

## 2022-05-10 MED ORDER — PROPOFOL 500 MG/50ML IV EMUL
INTRAVENOUS | Status: DC | PRN
  Administered 2022-05-10: 25 ug/kg/min via INTRAVENOUS

## 2022-05-10 MED ORDER — POVIDONE-IODINE 10 % EX SWAB: 2.0000 | Freq: Once | CUTANEOUS | Status: DC

## 2022-05-10 MED ORDER — PHENYLEPHRINE 80 MCG/ML (10ML) SYRINGE FOR IV PUSH (FOR BLOOD PRESSURE SUPPORT)
PREFILLED_SYRINGE | INTRAVENOUS | Status: AC
  Filled 2022-05-10: qty 10

## 2022-05-10 MED ORDER — ACETAMINOPHEN 500 MG PO TABS
1000.0000 mg | ORAL_TABLET | ORAL | Status: AC
  Administered 2022-05-10: 1000 mg via ORAL

## 2022-05-10 MED ORDER — SCOPOLAMINE 1 MG/3DAYS TD PT72
1.0000 | MEDICATED_PATCH | Freq: Once | TRANSDERMAL | Status: DC
  Administered 2022-05-10: 1.5 mg via TRANSDERMAL

## 2022-05-10 MED ORDER — PROPOFOL 10 MG/ML IV BOLUS
INTRAVENOUS | Status: AC
  Filled 2022-05-10: qty 20

## 2022-05-10 MED ORDER — LACTATED RINGERS IV SOLN: INTRAVENOUS | Status: DC

## 2022-05-10 MED ORDER — FENTANYL CITRATE (PF) 100 MCG/2ML IJ SOLN
INTRAMUSCULAR | Status: DC | PRN
  Administered 2022-05-10: 25 ug via INTRAVENOUS
  Administered 2022-05-10: 50 ug via INTRAVENOUS
  Administered 2022-05-10: 25 ug via INTRAVENOUS
  Administered 2022-05-10: 50 ug via INTRAVENOUS
  Administered 2022-05-10: 25 ug via INTRAVENOUS
  Administered 2022-05-10 (×2): 50 ug via INTRAVENOUS
  Administered 2022-05-10: 25 ug via INTRAVENOUS

## 2022-05-10 MED ORDER — BUPIVACAINE HCL (PF) 0.25 % IJ SOLN
INTRAMUSCULAR | Status: DC | PRN
  Administered 2022-05-10: 16 mL

## 2022-05-10 MED ORDER — PROPOFOL 10 MG/ML IV BOLUS
INTRAVENOUS | Status: DC | PRN
  Administered 2022-05-10: 150 mg via INTRAVENOUS

## 2022-05-10 MED ORDER — PHENYLEPHRINE 80 MCG/ML (10ML) SYRINGE FOR IV PUSH (FOR BLOOD PRESSURE SUPPORT)
PREFILLED_SYRINGE | INTRAVENOUS | Status: DC | PRN
  Administered 2022-05-10 (×2): 80 ug via INTRAVENOUS

## 2022-05-10 MED ORDER — CEFAZOLIN SODIUM-DEXTROSE 2-4 GM/100ML-% IV SOLN
2.0000 g | INTRAVENOUS | Status: AC
  Administered 2022-05-10 (×2): 2 g via INTRAVENOUS

## 2022-05-10 MED ORDER — ROCURONIUM BROMIDE 10 MG/ML (PF) SYRINGE
PREFILLED_SYRINGE | INTRAVENOUS | Status: AC
  Filled 2022-05-10: qty 10

## 2022-05-10 MED ORDER — LIDOCAINE 2% (20 MG/ML) 5 ML SYRINGE
INTRAMUSCULAR | Status: DC | PRN
  Administered 2022-05-10: 80 mg via INTRAVENOUS

## 2022-05-10 MED ORDER — SCOPOLAMINE 1 MG/3DAYS TD PT72
MEDICATED_PATCH | TRANSDERMAL | Status: AC
  Filled 2022-05-10: qty 1

## 2022-05-10 MED ORDER — SIMETHICONE 80 MG PO CHEW: 80.0000 mg | CHEWABLE_TABLET | Freq: Four times a day (QID) | ORAL | Status: DC | PRN

## 2022-05-10 MED ORDER — WATER FOR IRRIGATION, STERILE IR SOLN
Status: DC | PRN
  Administered 2022-05-10: 500 mL

## 2022-05-10 MED ORDER — KETOROLAC TROMETHAMINE 30 MG/ML IJ SOLN
INTRAMUSCULAR | Status: AC
  Filled 2022-05-10: qty 1

## 2022-05-10 MED ORDER — ONDANSETRON HCL 4 MG/2ML IJ SOLN
4.0000 mg | Freq: Four times a day (QID) | INTRAMUSCULAR | Status: DC | PRN
  Administered 2022-05-10: 4 mg via INTRAVENOUS

## 2022-05-10 MED ORDER — PHENAZOPYRIDINE HCL 100 MG PO TABS
200.0000 mg | ORAL_TABLET | ORAL | Status: AC
  Administered 2022-05-10: 200 mg via ORAL

## 2022-05-10 MED ORDER — DEXMEDETOMIDINE HCL IN NACL 80 MCG/20ML IV SOLN
INTRAVENOUS | Status: AC
  Filled 2022-05-10: qty 20

## 2022-05-10 MED ORDER — DEXAMETHASONE SODIUM PHOSPHATE 10 MG/ML IJ SOLN
INTRAMUSCULAR | Status: AC
  Filled 2022-05-10: qty 1

## 2022-05-10 MED ORDER — ONDANSETRON HCL 4 MG/2ML IJ SOLN
INTRAMUSCULAR | Status: AC
  Filled 2022-05-10: qty 2

## 2022-05-10 MED ORDER — LIDOCAINE HCL (PF) 2 % IJ SOLN
INTRAMUSCULAR | Status: AC
  Filled 2022-05-10: qty 5

## 2022-05-10 MED ORDER — DEXMEDETOMIDINE (PRECEDEX) IN NS 20 MCG/5ML (4 MCG/ML) IV SYRINGE
PREFILLED_SYRINGE | INTRAVENOUS | Status: DC | PRN
  Administered 2022-05-10 (×2): 8 ug via INTRAVENOUS

## 2022-05-10 MED ORDER — ONDANSETRON HCL 4 MG/2ML IJ SOLN
4.0000 mg | Freq: Once | INTRAMUSCULAR | Status: DC | PRN
Start: 2022-05-10 — End: 2022-05-11

## 2022-05-10 MED ORDER — SUGAMMADEX SODIUM 200 MG/2ML IV SOLN
INTRAVENOUS | Status: DC | PRN
  Administered 2022-05-10: 200 mg via INTRAVENOUS

## 2022-05-10 MED ORDER — GABAPENTIN 300 MG PO CAPS
ORAL_CAPSULE | ORAL | Status: AC
  Filled 2022-05-10: qty 1

## 2022-05-10 MED ORDER — OXYCODONE HCL 5 MG PO TABS
5.0000 mg | ORAL_TABLET | ORAL | Status: DC | PRN
  Administered 2022-05-10 – 2022-05-11 (×2): 5 mg via ORAL
  Administered 2022-05-11 (×2): 10 mg via ORAL

## 2022-05-10 MED ORDER — CEFAZOLIN SODIUM-DEXTROSE 2-4 GM/100ML-% IV SOLN
INTRAVENOUS | Status: AC
  Filled 2022-05-10: qty 100

## 2022-05-10 MED ORDER — ONDANSETRON HCL 4 MG/2ML IJ SOLN
INTRAMUSCULAR | Status: DC | PRN
  Administered 2022-05-10: 4 mg via INTRAVENOUS

## 2022-05-10 MED ORDER — KETOROLAC TROMETHAMINE 30 MG/ML IJ SOLN
INTRAMUSCULAR | Status: DC | PRN
  Administered 2022-05-10: 30 mg via INTRAVENOUS

## 2022-05-10 MED ORDER — MIDAZOLAM HCL 2 MG/2ML IJ SOLN
INTRAMUSCULAR | Status: AC
  Filled 2022-05-10: qty 2

## 2022-05-10 MED ORDER — ONDANSETRON HCL 4 MG PO TABS: 4.0000 mg | ORAL_TABLET | Freq: Four times a day (QID) | ORAL | Status: DC | PRN

## 2022-05-10 MED ORDER — HEMOSTATIC AGENTS (NO CHARGE) OPTIME
TOPICAL | Status: DC | PRN
  Administered 2022-05-10 (×2): 1

## 2022-05-10 MED ORDER — GABAPENTIN 300 MG PO CAPS
300.0000 mg | ORAL_CAPSULE | ORAL | Status: AC
  Administered 2022-05-10: 300 mg via ORAL

## 2022-05-10 MED ORDER — OXYCODONE HCL 5 MG PO TABS
ORAL_TABLET | ORAL | Status: AC
  Filled 2022-05-10: qty 2

## 2022-05-10 MED ORDER — ACETAMINOPHEN 325 MG PO TABS: 650.0000 mg | ORAL_TABLET | ORAL | Status: DC | PRN

## 2022-05-10 MED ORDER — SODIUM CHLORIDE 0.9 % IR SOLN
Status: DC | PRN
  Administered 2022-05-10: 600 mL via INTRAVESICAL
  Administered 2022-05-10: 400 mL

## 2022-05-10 MED ORDER — MIDAZOLAM HCL 2 MG/2ML IJ SOLN
INTRAMUSCULAR | Status: DC | PRN
  Administered 2022-05-10: 2 mg via INTRAVENOUS

## 2022-05-10 MED ORDER — ACETAMINOPHEN 500 MG PO TABS
ORAL_TABLET | ORAL | Status: AC
  Filled 2022-05-10: qty 2

## 2022-05-10 SURGICAL SUPPLY — 83 items
ADH SKN CLS APL DERMABOND .7 (GAUZE/BANDAGES/DRESSINGS) ×3
APL PRP STRL LF DISP 70% ISPRP (MISCELLANEOUS) ×3
APL SRG 38 LTWT LNG FL B (MISCELLANEOUS) ×3
APPLICATOR ARISTA FLEXITIP XL (MISCELLANEOUS) ×1 IMPLANT
BLADE SURG 10 STRL SS (BLADE) ×1 IMPLANT
BLADE SURG 11 STRL SS (BLADE) ×1 IMPLANT
CATH FOLEY 3WAY  5CC 16FR (CATHETERS) ×1
CATH FOLEY 3WAY 5CC 16FR (CATHETERS) ×3 IMPLANT
CHLORAPREP W/TINT 26 (MISCELLANEOUS) ×4 IMPLANT
COVER BACK TABLE 60X90IN (DRAPES) ×4 IMPLANT
COVER SURGICAL LIGHT HANDLE (MISCELLANEOUS) ×1 IMPLANT
COVER TIP SHEARS 8 DVNC (MISCELLANEOUS) ×3 IMPLANT
COVER TIP SHEARS 8MM DA VINCI (MISCELLANEOUS) ×1
DECANTER SPIKE VIAL GLASS SM (MISCELLANEOUS) ×8 IMPLANT
DEFOGGER SCOPE WARMER CLEARIFY (MISCELLANEOUS) ×4 IMPLANT
DERMABOND ADVANCED (GAUZE/BANDAGES/DRESSINGS) ×1
DERMABOND ADVANCED .7 DNX12 (GAUZE/BANDAGES/DRESSINGS) ×3 IMPLANT
DRAPE ARM DVNC X/XI (DISPOSABLE) ×12 IMPLANT
DRAPE COLUMN DVNC XI (DISPOSABLE) ×3 IMPLANT
DRAPE DA VINCI XI ARM (DISPOSABLE) ×4
DRAPE DA VINCI XI COLUMN (DISPOSABLE) ×1
DRAPE SHEET LG 3/4 BI-LAMINATE (DRAPES) ×4 IMPLANT
DRAPE UTILITY XL STRL (DRAPES) ×4 IMPLANT
DRSG TEGADERM 2-3/8X2-3/4 SM (GAUZE/BANDAGES/DRESSINGS) ×1 IMPLANT
ELECT REM PT RETURN 9FT ADLT (ELECTROSURGICAL) ×4
ELECTRODE REM PT RTRN 9FT ADLT (ELECTROSURGICAL) ×3 IMPLANT
GAUZE 4X4 16PLY ~~LOC~~+RFID DBL (SPONGE) ×8 IMPLANT
GLOVE BIO SURGEON STRL SZ 6 (GLOVE) ×12 IMPLANT
GLOVE BIOGEL PI IND STRL 6.5 (GLOVE) ×12 IMPLANT
GLOVE BIOGEL PI IND STRL 7.0 (GLOVE) ×6 IMPLANT
GLOVE BIOGEL PI INDICATOR 6.5 (GLOVE) ×4
GLOVE BIOGEL PI INDICATOR 7.0 (GLOVE) ×2
GOWN STRL REUS W/TWL LRG LVL3 (GOWN DISPOSABLE) ×4 IMPLANT
HEMOSTAT ARISTA ABSORB 3G PWDR (HEMOSTASIS) ×2 IMPLANT
HIBICLENS CHG 4% 4OZ BTL (MISCELLANEOUS) ×4 IMPLANT
HOLDER FOLEY CATH W/STRAP (MISCELLANEOUS) ×4 IMPLANT
IRRIG SUCT STRYKERFLOW 2 WTIP (MISCELLANEOUS) ×4
IRRIGATION SUCT STRKRFLW 2 WTP (MISCELLANEOUS) ×3 IMPLANT
KIT TURNOVER CYSTO (KITS) ×4 IMPLANT
LEGGING LITHOTOMY PAIR STRL (DRAPES) ×4 IMPLANT
LIGASURE VESSEL 5MM BLUNT TIP (ELECTROSURGICAL) ×1 IMPLANT
MANIFOLD NEPTUNE II (INSTRUMENTS) ×4 IMPLANT
MANIPULATOR ADVINCU DEL 2.5 PL (MISCELLANEOUS) IMPLANT
MANIPULATOR ADVINCU DEL 3.0 PL (MISCELLANEOUS) IMPLANT
MANIPULATOR ADVINCU DEL 3.5 PL (MISCELLANEOUS) IMPLANT
MANIPULATOR ADVINCU DEL 4.0 PL (MISCELLANEOUS) IMPLANT
MESH VERTESSA LITE -Y 2X4X3 (Mesh General) ×4 IMPLANT
NEEDLE INSUFFLATION 120MM (ENDOMECHANICALS) ×4 IMPLANT
OBTURATOR OPTICAL STANDARD 8MM (TROCAR) ×1
OBTURATOR OPTICAL STND 8 DVNC (TROCAR) ×3
OBTURATOR OPTICALSTD 8 DVNC (TROCAR) ×3 IMPLANT
PACK CYSTO (CUSTOM PROCEDURE TRAY) ×4 IMPLANT
PACK ROBOT WH (CUSTOM PROCEDURE TRAY) ×4 IMPLANT
PACK ROBOTIC GOWN (GOWN DISPOSABLE) ×4 IMPLANT
PAD OB MATERNITY 4.3X12.25 (PERSONAL CARE ITEMS) ×4 IMPLANT
PAD POSITIONING PINK XL (MISCELLANEOUS) ×4 IMPLANT
PAD PREP 24X48 CUFFED NSTRL (MISCELLANEOUS) ×4 IMPLANT
POUCH LAPAROSCOPIC INSTRUMENT (MISCELLANEOUS) IMPLANT
PROTECTOR NERVE ULNAR (MISCELLANEOUS) ×4 IMPLANT
SEAL CANN UNIV 5-8 DVNC XI (MISCELLANEOUS) ×15 IMPLANT
SEAL XI 5MM-8MM UNIVERSAL (MISCELLANEOUS) ×5
SEALER VESSEL DA VINCI XI (MISCELLANEOUS) ×1
SEALER VESSEL EXT DVNC XI (MISCELLANEOUS) IMPLANT
SET IRRIG Y TYPE TUR BLADDER L (SET/KITS/TRAYS/PACK) ×4 IMPLANT
SET TUBE SMOKE EVAC HIGH FLOW (TUBING) ×4 IMPLANT
SPIKE FLUID TRANSFER (MISCELLANEOUS) ×1 IMPLANT
SPONGE T-LAP 4X18 ~~LOC~~+RFID (SPONGE) IMPLANT
SUT ABS MONO DBL WITH NDL 48IN (SUTURE) IMPLANT
SUT GORETEX NAB #0 THX26 36IN (SUTURE) ×4 IMPLANT
SUT MNCRL AB 4-0 PS2 18 (SUTURE) ×5 IMPLANT
SUT MON AB 2-0 SH 27 (SUTURE) ×5 IMPLANT
SUT VIC AB 0 CT1 27 (SUTURE) ×8
SUT VIC AB 0 CT1 27XBRD ANTBC (SUTURE) ×6 IMPLANT
SUT VIC AB 2-0 SH 27 (SUTURE) ×4
SUT VIC AB 2-0 SH 27XBRD (SUTURE) IMPLANT
SUT VICRYL 0 UR6 27IN ABS (SUTURE) ×1 IMPLANT
SUT VLOC 180 0 9IN  GS21 (SUTURE)
SUT VLOC 180 0 9IN GS21 (SUTURE) IMPLANT
SUT VLOC 180 2-0 9IN GS21 (SUTURE) ×8 IMPLANT
SYS RETRIEVAL 5MM INZII UNIV (BASKET) ×4
SYSTEM RETRIEVL 5MM INZII UNIV (BASKET) IMPLANT
TOWEL OR 17X26 10 PK STRL BLUE (TOWEL DISPOSABLE) ×4 IMPLANT
alexis laparoscopic system ×1 IMPLANT

## 2022-05-10 NOTE — Discharge Instructions (Signed)

## 2022-05-10 NOTE — Anesthesia Preprocedure Evaluation (Signed)
Anesthesia Evaluation  Patient identified by MRN, date of birth, ID band Patient awake    Reviewed: Allergy & Precautions, NPO status , Patient's Chart, lab work & pertinent test results  History of Anesthesia Complications (+) PONV and history of anesthetic complications  Airway Mallampati: II  TM Distance: >3 FB Neck ROM: Full    Dental  (+) Teeth Intact, Dental Advisory Given   Pulmonary asthma , former smoker,    Pulmonary exam normal breath sounds clear to auscultation       Cardiovascular negative cardio ROS Normal cardiovascular exam Rhythm:Regular Rate:Normal     Neuro/Psych PSYCHIATRIC DISORDERS Anxiety Depression negative neurological ROS     GI/Hepatic negative GI ROS, Neg liver ROS,   Endo/Other  negative endocrine ROS  Renal/GU negative Renal ROS  Female GU complaint     Musculoskeletal negative musculoskeletal ROS (+)   Abdominal   Peds  (+) ATTENTION DEFICIT DISORDER WITHOUT HYPERACTIVITY Hematology negative hematology ROS (+)   Anesthesia Other Findings Day of surgery medications reviewed with the patient.  Reproductive/Obstetrics ovarian mass; vaginal vault prolapse after hysterectomy; cystocele                             Anesthesia Physical Anesthesia Plan  ASA: 2  Anesthesia Plan: General   Post-op Pain Management: Tylenol PO (pre-op)*   Induction: Intravenous  PONV Risk Score and Plan: 4 or greater and Scopolamine patch - Pre-op, Midazolam, Dexamethasone and Ondansetron  Airway Management Planned: Oral ETT  Additional Equipment:   Intra-op Plan:   Post-operative Plan: Extubation in OR  Informed Consent: I have reviewed the patients History and Physical, chart, labs and discussed the procedure including the risks, benefits and alternatives for the proposed anesthesia with the patient or authorized representative who has indicated his/her understanding  and acceptance.     Dental advisory given  Plan Discussed with: CRNA  Anesthesia Plan Comments: (2nd PIV after induction)        Anesthesia Quick Evaluation

## 2022-05-10 NOTE — Progress Notes (Signed)
Pt's husband given update:  pt doing well, getting ovarian mass out now, and still more surgery to go

## 2022-05-10 NOTE — Progress Notes (Signed)
Pt has a saline lock in left hand in placed in OR

## 2022-05-10 NOTE — Interval H&P Note (Signed)
History and Physical Interval Note:  05/10/2022 11:56 AM  Jacqueline Hughes  has presented today for surgery, with the diagnosis of ovarian mass; vaginal vault prolapse after hysterectomy; cystocele.  The various methods of treatment have been discussed with the patient and family. After consideration of risks, benefits and other options for treatment, the patient has consented to  Procedure(s) with comments: XI ROBOTIC ASSISTED SALPINGO OOPHORECTOMY (Bilateral)  XI ROBOTIC ASSISTED LAPAROSCOPIC SACROCOLPOPEXY (N/A) CYSTOSCOPY (N/A) as a surgical intervention.  The patient's history has been reviewed, patient examined, no change in status, stable for surgery.  I have reviewed the patient's chart and labs.  Questions were answered to the patient's satisfaction.     [name]

## 2022-05-10 NOTE — Transfer of Care (Signed)
Immediate Anesthesia Transfer of Care Note  Patient: Jacqueline Hughes  Procedure(s) Performed: Procedure(s) (LRB): XI ROBOTIC ASSISTED SALPINGO OOPHORECTOMY AND LYSIS OF ADHESIONS (Bilateral) XI ROBOTIC ASSISTED LAPAROSCOPIC SACROCOLPOPEXY (N/A) CYSTOSCOPY (N/A)  Patient Location: PACU  Anesthesia Type: General  Level of Consciousness:  Sedated and sleepy  Airway & Oxygen Therapy: Patient Spontanous Breathing and Patient connected to face mask oxygen  Post-op Assessment: Report given to PACU RN and Post -op Vital signs reviewed and stable  Post vital signs: Reviewed and stable  Complications: No apparent anesthesia complications Last Vitals:  Vitals Value Taken Time  BP 108/80 05/10/22 1804  Temp    Pulse 96 05/10/22 1808  Resp 11 05/10/22 1808  SpO2 99 % 05/10/22 1808  Vitals shown include unvalidated device data.  Last Pain:  Vitals:   05/10/22 0919  TempSrc: Oral         Complications: No notable events documented.

## 2022-05-10 NOTE — Anesthesia Procedure Notes (Signed)
Procedure Name: Intubation Date/Time: 05/10/2022 12:39 PM  Performed by: Suan Halter, CRNAPre-anesthesia Checklist: Patient identified, Emergency Drugs available, Suction available and Patient being monitored Patient Re-evaluated:Patient Re-evaluated prior to induction Oxygen Delivery Method: Circle system utilized Preoxygenation: Pre-oxygenation with 100% oxygen Induction Type: IV induction Ventilation: Mask ventilation without difficulty Laryngoscope Size: Mac and 3 Grade View: Grade III Tube type: Oral Tube size: 7.0 mm Number of attempts: 2 Airway Equipment and Method: Stylet and Oral airway Placement Confirmation: ETT inserted through vocal cords under direct vision, positive ETCO2 and breath sounds checked- equal and bilateral Secured at: 22 cm Tube secured with: Tape Dental Injury: Teeth and Oropharynx as per pre-operative assessment  Comments: Atraumatic DL x 2 (x1 Courvoisier Hamblen, x1 Turk) with MAC 3 - pt with overbite and anterior airway, unable to directly visualize cords.  BBS =,+ and ETCO2 +

## 2022-05-10 NOTE — Op Note (Addendum)
Operative Note  Preoperative Diagnosis: anterior vaginal prolapse, posterior vaginal prolapse, vaginal vault prolapse after hysterectomy, and 8cm left ovarian mass  Postoperative Diagnosis: same  Procedures performed:  Robotic assisted laparoscopic lysis of adhesions, bilateral salpingo-oophorectomy, sacrocolpopexy, cystoscopy  Implants:  Implant Name Type Inv. Item Serial No. Manufacturer Lot No. LRB No. Used Action  MESH Valli Glance [id number] Mesh [employer] MESH Sumner [id number] N/A 1 Implanted    Attending Surgeon: Sherlene Shams, MD  Assistant Surgeon: Lyman Speller, MD  Anesthesia: General endotracheal  Findings: 1. 8cm left ovarian mass, partially retroperitonealized, with adhesions of the bowel to its surface  2. Adhesions of omentum to anterior abdominal wall and of bowel to vaginal cuff and left ovary   3. Normal appearing right ovary.   Specimens:  ID Type Source Tests Collected by Time Destination  1 : left ovary Tissue PATH Gyn biopsy SURGICAL PATHOLOGY [name], MD 05/10/2022 1525   2 : right fallopian tube and ovary Tissue PATH Gyn biopsy SURGICAL PATHOLOGY [name], MD 05/10/2022 1538   A : pelvic washings Body Fluid Peritoneal Washings CYTOLOGY - NON PAP [name], MD 05/10/2022 1358     Estimated blood loss: 200 mL  IV fluids: 2050 mL  Urine output: 254 mL  Complications: none  Procedure in Detail:  After informed consent was obtained, the patient was taken to the operating room, where general anesthesia was induced and found to be adequate. She was placed in dorsolithotomy position in yellowfin stirrups. Her hips were noted not to be hyperflexed or hyperextended. Her arms were padded with gel pads and tucked to her sides. Her hands were surrounded by foam. A padded strap was placed across her chest with foam between the pad and her skin. She was noted to be appropriately  positioned with all pressure points well padded and off tension. A tilt test showed no slippage. She was prepped and draped in the usual sterile fashion.  A sterile Foley catheter was inserted.   0.25% plain Marcaine was injected at the umbilicus and an incision was made with a scalpel. A Veress needle was inserted into the incision, CO2 insufflation was started, a low opening pressure was noted, and pneumoperitoneum was obtained. The Veress needle was removed and a 96m trocar was placed. Entry into the peritoneal cavity was confirmed. Dense omental adhesions were noted of the omentum in the low pelvis to the anterior abominal wall. After determining placement for the other ports, Local anesthetic was injected at each site and two 8 mm incisions were made for robotic ports at 10 cm lateral to and at the level of the umbilical port. Two additional 8 mm incisions were made 10 cm lateral to these and 30 degrees down followed by 8 mm robotic ports - the right side for an assistant port. All trocars were placed sequentially under direct visualization of the camera. The patient was placed in Trendelenburg. A ligasure was inserted an omental adhesions were removed from the anterior abdominal wall. The sacrum appeared to be free of any adhesive disease.The robot was docked on the patient's right side. Monopolar endoshears were placed in the right arm, a [place] bipolar grasper was placed in the 2nd arm of the patient's left side, and a Tip up grasper was placed in the 3rd arm on the patient's left side.   Bowel adhesions were carefully removed from the surface of the left ovarian mass using sharp dissection  and electrocautery were needed.  Bowel adhesions were also removed from the vaginal cuff.  Removal of adhesions took approximately 1 hour.  The left-sided retroperitoneal space was opened by incising the peritoneum parallel to the IP ligament.  The left ureter was identified and noted to be far away from the planned  incision site.  GYN oncology Dr.Tucker was called intraoperatively to assess the mass as it was partially partially retroperitonealized.  She cauterized cut and ligated the IP ligament and carefully dissected the mass from the retroperitoneal space.  Once the mass was removed it was placed into an Endo Catch bag.  The right lateral laparoscopic incision was extended and an Chiropodist was placed.  The Endo Catch bag was brought through the Alexis retractor and the specimen was removed in pieces and sent to pathology for frozen specimen.  Frozen specimen revealed benign lesion.  Cover was placed over the Alexis retractor to accommodate a trocar and the robotic trocar was replaced.  Attention was then turned to the right ovary.  The IP ligament was identified and the ureter was noted to be far from the planned incision site.  The IP ligament was then cauterized and cut and the ovarian attachments were also removed.  The ovary was then removed through the open Alexis retractor site.  Vascular was performed at this time with a 70 degree cystoscope.  360 degree view of the bladder revealed no bladder injuries and brisk bilateral ureteral E flux was present.  The cystoscope was removed and the Foley catheter was replaced.   With a lucite probe in the vagina, anterior vaginal dissection was then performed with sharp dissection and electrosurgery to separate the vesicovaginal space to the level of the urethra. The posterior vaginal dissection was then performed with sharp dissection and electrosurgery in order to dissect the rectum away from the posterior vagina. The  Attention was then turned to the sacral promontory. The peritoneum overlying the sacral promontory was tented up, dissected sharply with monopolar scissors and electrosurgery using layer by layer technique. The overlying areolar and adipose tissue were taken down until the anterior longitudinal sacral ligament was identified. The peritoneal incision was  extended down to the posterior cul-de-sac. This was performed with care to avoid the ureter on the right side and the sigmoid colon and its mesentary on the left side.   Small vessels were cauterized along the way to obtain excellent hemostasis. A "Y" mesh was then inserted into the abdomen after trimming to appropriate size. With the probe in the vagina, the anterior leaf of the Y mesh was affixed to the anterior portion of the vagina using a 2-0 v-loc suture in a spiral pattern to distribute the suture evenly across the surface of the anterior mesh leaf. In a similar fashion, the posterior leaf of the Y mesh was attached to the posterior surface of the vagina with 2-0 v-loc suture.  The distal end of the mesh was then brought to overlie the sacrum. The correct amount of tension was determined in order to elevate the vagina, but not put the mesh under tension. The distal end of the mesh was then affixed to the anterior longitudinal sacral ligament using two interrupted transverse stitches of CV2 Gortex. The excess distal mesh was then cut and removed.  Arista was placed over all surgical sites and good hemostasis was noted.  The peritoneum was reapproximated over the mesh using 2-0 monocryl. The bladder flap was incorporated to completely retroperitonealize the mesh. All pedicles  were carefully inspected and noted to be hemostatic as the CO2 gas was deflated. All instruments were removed from the patient's abdomen.    The Foley catheter was removed.  A 70-degree cystoscope was introduced, and 360-degree inspection revealed no injury, lesion or foreign body in the bladder. Brisk bilateral ureteral efflux was noted with the assistance of pyridium.  The bladder was drained and the cystoscope was removed.  The Foley catheter was replaced.   The robot was undocked. The CO2 gas was removed and the ports were removed.  The larger right lateral incision fascia was grasped with Kocher clamps and closed with 0 Vicryl  suture in a running fashion.  The subcutaneous tissue was closed with a 2-0 Vicryl suture in a running fashion.  And the skin was closed with a 4-0 Monocryl suture in a subcuticular fashion.  The laparoscopic skin incisions were closed with subcutaneous stitches of 4-0 Monocryl and covered with skin glue.    The patient tolerated the procedure well. Sponge, lap, and needle counts were correct x 2. She was awakened from anesthesia and transferred to the recovery room in stable condition.  Dr. Ammie Ferrier assistance was required throughout the case due to its complexity.  [name], MD

## 2022-05-10 NOTE — Progress Notes (Signed)
Sula Soda Rieson called dr. Wannetta Sender notified pt vomited X2, orders received to repeat Zofran.  Pt c/o need to void pt has a foley catheter in place explained to pt and husband that there is a balloon in her bladder holding catheter in place and that is what she is feeling, this has been explained several times to pt and Dr. Wannetta Sender aware.

## 2022-05-11 ENCOUNTER — Encounter (HOSPITAL_BASED_OUTPATIENT_CLINIC_OR_DEPARTMENT_OTHER): Payer: Self-pay | Admitting: Obstetrics and Gynecology

## 2022-05-11 DIAGNOSIS — N993 Prolapse of vaginal vault after hysterectomy: Secondary | ICD-10-CM | POA: Diagnosis not present

## 2022-05-11 MED ORDER — OXYCODONE HCL 5 MG PO TABS: 5.0000 mg | ORAL_TABLET | ORAL | 0 refills | Status: DC | PRN

## 2022-05-11 MED ORDER — OXYCODONE HCL 5 MG PO TABS
ORAL_TABLET | ORAL | Status: AC
  Filled 2022-05-11: qty 2

## 2022-05-11 NOTE — Anesthesia Postprocedure Evaluation (Signed)
Anesthesia Post Note  Patient: Jacqueline Hughes  Procedure(s) Performed: XI ROBOTIC ASSISTED SALPINGO OOPHORECTOMY AND LYSIS OF ADHESIONS (Bilateral: Pelvis) XI ROBOTIC ASSISTED LAPAROSCOPIC SACROCOLPOPEXY (Pelvis) CYSTOSCOPY (Bladder)     Patient location during evaluation: PACU Anesthesia Type: General Level of consciousness: sedated and patient cooperative Pain management: pain level controlled Vital Signs Assessment: post-procedure vital signs reviewed and stable Respiratory status: spontaneous breathing Cardiovascular status: stable Anesthetic complications: no   No notable events documented.  Last Vitals:  Vitals:   05/11/22 0130 05/11/22 0712  BP: 118/77 99/68  Pulse:  94  Resp: 18 18  Temp: 36.7 C 36.9 C  SpO2: 96% 95%    Last Pain:  Vitals:   05/11/22 0712  TempSrc:   PainSc: [place]

## 2022-05-11 NOTE — Progress Notes (Signed)
Pt states pain is severe and only maintained with 10 mg of oxycodone. Pt is being discharged home with Rx for oxycodone 5 mg. Pt states her pain will not be able to be controlled on 5 mg oxycodone and states she cannot go home without 10 mg oxycodone. Reports she has discussed pain level with Dr. Wannetta Sender but requests RN call MD prior to D/C. TC to Dr. Ruby Cola, MD states she will change Rx to oxy 5-10 mg prn pain. Pt informed.

## 2022-05-12 ENCOUNTER — Encounter: Payer: Self-pay | Admitting: *Deleted

## 2022-05-12 LAB — CYTOLOGY - NON PAP

## 2022-05-12 LAB — SURGICAL PATHOLOGY

## 2022-05-12 NOTE — Discharge Summary (Signed)
Physician Discharge Summary  Patient ID: Jacqueline Hughes MRN: [id number] DOB/AGE: [birth year] 55 y.o.  Admit date: 05/10/2022 Discharge date: 05/12/2022  Admission Diagnoses:  Discharge Diagnoses:  Principal Problem:   Vaginal vault prolapse after hysterectomy   Discharged Condition: good  Hospital Course: Patient underwent Robotic assisted laparoscopic lysis of adhesions, bilateral salpingo-oophorectomy, sacrocolpopexy, cystoscopy. Post-operatively, she was tolerating regular diet, ambulating and pain was controlled with '10mg'$  oxycodone. She passed her voiding trial.   Consults: None  Significant Diagnostic Studies: none  Treatments: surgery  Discharge Exam: Blood pressure 100/68, pulse 99, temperature 98.5 F (36.9 C), resp. rate 18, height 5' 8.5" (1.74 m), weight 73.2 kg, SpO2 95 %. General appearance: alert and cooperative Resp: clear to auscultation bilaterally Cardio: regular rate and rhythm, S1, S2 normal, no murmur, click, rub or gallop GI: soft, non-tender; bowel sounds normal; no masses,  no organomegaly. Incisions c/d/i Extremities: extremities normal, atraumatic, no cyanosis or edema  Disposition: Discharge disposition: 01-Home or Self Care       Discharge Instructions     Call MD for:  persistant nausea and vomiting   Complete by: As directed    Call MD for:  redness, tenderness, or signs of infection (pain, swelling, redness, odor or green/yellow discharge around incision site)   Complete by: As directed    Call MD for:  severe uncontrolled pain   Complete by: As directed    Call MD for:  temperature >100.4   Complete by: As directed    Diet general   Complete by: As directed    Increase activity slowly   Complete by: As directed    May walk up steps   Complete by: As directed    No wound care   Complete by: As directed       Allergies as of 05/11/2022       Reactions   E-mycin [erythromycin]    Rash   Lexapro [escitalopram Oxalate]    Confused  and disoriented.    Augmentin [amoxicillin-pot Clavulanate] Diarrhea   Contracted C.diff 14 days finishing augmentin Noted diarrhea 3 days after starting augmentin   Lactose Intolerance (gi)    Covid-19 Mrna Vaccine (pfizer) [covid-19 Mrna Vacc (moderna)] Rash   Started on prednisone last night- vaccine on Saturday   Gadolinium Itching, Rash   12/13/21: pt developed itching and rash about 10 min post MRI constrast. 50 mg of benadryl given. AV, RN        Medication List     STOP taking these medications    nystatin cream Commonly known as: MYCOSTATIN   predniSONE 20 MG tablet Commonly known as: DELTASONE       TAKE these medications    acetaminophen 500 MG tablet Commonly known as: TYLENOL Take 1 tablet (500 mg total) by mouth every 6 (six) hours as needed (pain).   ALPRAZolam 0.5 MG tablet Commonly known as: Xanax Take 1 tablet (0.5 mg total) by mouth at bedtime as needed for anxiety.   amphetamine-dextroamphetamine 15 MG 24 hr capsule Commonly known as: Adderall XR Take 1 capsule by mouth every morning.   amphetamine-dextroamphetamine 15 MG 24 hr capsule Commonly known as: ADDERALL XR Take 1 capsule by mouth every morning.   dicyclomine 10 MG capsule Commonly known as: BENTYL TAKE 1 CAPSULE (10 MG TOTAL) BY MOUTH EVERY 6 (SIX) HOURS AS NEEDED FOR SPASMS.   estradiol 0.1 MG/GM vaginal cream Commonly known as: ESTRACE 1 gram vaginally twice weekly   FLUoxetine 20 MG capsule Commonly known  as: PROZAC Take 1 capsule (20 mg total) by mouth daily.   ibuprofen 600 MG tablet Commonly known as: ADVIL Take 1 tablet (600 mg total) by mouth every 6 (six) hours as needed.   ondansetron 4 MG tablet Commonly known as: ZOFRAN Take 1 tablet (4 mg total) by mouth every 8 (eight) hours as needed for nausea or vomiting.   oxyCODONE 5 MG immediate release tablet Commonly known as: Oxy IR/ROXICODONE Take 1 tablet (5 mg total) by mouth every 4 (four) hours as needed for  severe pain.   polyethylene glycol powder 17 GM/SCOOP powder Commonly known as: GLYCOLAX/MIRALAX Take 17 g by mouth daily. Drink 17g (1 scoop) dissolved in water per day.   valACYclovir 1000 MG tablet Commonly known as: VALTREX Take 2 tablets (2,000 mg total) by mouth 2 (two) times daily. For cold sore   Vitamin D (Ergocalciferol) 1.25 MG (50000 UNIT) Caps capsule Commonly known as: DRISDOL Take 1 capsule (50,000 Units total) by mouth every 7 (seven) days.         Signed: [name], MD

## 2022-06-08 ENCOUNTER — Other Ambulatory Visit: Payer: Self-pay | Admitting: Physician Assistant

## 2022-06-09 NOTE — Telephone Encounter (Signed)
Last written 04/15/2022 #30 no refills Last appt 11/03/2021 (had acute virtual since)

## 2022-06-10 NOTE — Telephone Encounter (Signed)
LVM for patient to call back to get a f/u appt scheduled for any further RX refills. AMUCK

## 2022-06-10 NOTE — Telephone Encounter (Signed)
Needs appt, please call to schedule.

## 2022-06-11 ENCOUNTER — Other Ambulatory Visit: Payer: Self-pay | Admitting: Physician Assistant

## 2022-06-13 NOTE — Telephone Encounter (Signed)
Patient overdue for appt, tried to call patient and she has not returned calls.  Can you sign denial? I do not have the clearance.

## 2022-06-15 ENCOUNTER — Encounter: Payer: Self-pay | Admitting: Physician Assistant

## 2022-06-15 ENCOUNTER — Ambulatory Visit (INDEPENDENT_AMBULATORY_CARE_PROVIDER_SITE_OTHER): Payer: BC Managed Care – PPO | Admitting: Physician Assistant

## 2022-06-15 VITALS — BP 112/71 | HR 76 | Ht 68.5 in | Wt 165.0 lb

## 2022-06-15 DIAGNOSIS — Z1211 Encounter for screening for malignant neoplasm of colon: Secondary | ICD-10-CM | POA: Diagnosis not present

## 2022-06-15 DIAGNOSIS — B001 Herpesviral vesicular dermatitis: Secondary | ICD-10-CM

## 2022-06-15 DIAGNOSIS — F909 Attention-deficit hyperactivity disorder, unspecified type: Secondary | ICD-10-CM | POA: Diagnosis not present

## 2022-06-15 DIAGNOSIS — F430 Acute stress reaction: Secondary | ICD-10-CM | POA: Diagnosis not present

## 2022-06-15 DIAGNOSIS — F329 Major depressive disorder, single episode, unspecified: Secondary | ICD-10-CM

## 2022-06-15 DIAGNOSIS — F5102 Adjustment insomnia: Secondary | ICD-10-CM

## 2022-06-15 DIAGNOSIS — K591 Functional diarrhea: Secondary | ICD-10-CM

## 2022-06-15 MED ORDER — AMPHETAMINE-DEXTROAMPHET ER 15 MG PO CP24: 15.0000 mg | ORAL_CAPSULE | ORAL | 0 refills | Status: DC

## 2022-06-15 MED ORDER — ALPRAZOLAM 0.5 MG PO TABS
0.5000 mg | ORAL_TABLET | Freq: Every evening | ORAL | 2 refills | Status: DC | PRN
Start: 2022-06-15 — End: 2024-01-19

## 2022-06-15 MED ORDER — ESZOPICLONE 3 MG PO TABS: 3.0000 mg | ORAL_TABLET | Freq: Every day | ORAL | 5 refills | Status: DC

## 2022-06-15 MED ORDER — VALACYCLOVIR HCL 1 G PO TABS: 2000.0000 mg | ORAL_TABLET | Freq: Two times a day (BID) | ORAL | 1 refills | Status: DC

## 2022-06-15 MED ORDER — DICYCLOMINE HCL 10 MG PO CAPS: 10.0000 mg | ORAL_CAPSULE | Freq: Four times a day (QID) | ORAL | 5 refills | Status: DC | PRN

## 2022-06-15 NOTE — Progress Notes (Signed)
Established Patient Office Visit  Subjective   Patient ID: Jacqueline Hughes, female    DOB: 06/10/1966  Age: 56 y.o. MRN: [id number]  Chief Complaint  Patient presents with   Follow-up    HPI Pt is a 56 yo female with ADHD, Anxiety, depression, Diarrhea, recurrent cold sores who presents to the clinic for medication refills.   She is healing from hysterectomy but doing well.   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 No concerns. Medications working well. Continues to have intermittent diarrhea but not daily. Bentyl helps. Recurrent cold sores during stress or illness but valtrex helps.   She does have periods of insomnia that last for a week or so and then resolve. She would like medication to help during these times. Failed ambien.   Patient Active Problem List   Diagnosis Date Noted   Adjustment insomnia 06/15/2022   Vaginal vault prolapse after hysterectomy 05/10/2022   Ovarian mass, left 11/05/2021   History of Clostridioides difficile colitis 11/03/2021   C. difficile colitis 10/20/2021   Thyroid disease    S/P bilateral breast implants 01/17/2020   Muscle spasm 03/18/2019   Acute stress reaction 03/18/2019   Stress at work 08/07/2018   Grief reaction with prolonged bereavement 08/02/2018   Elevated LDL cholesterol level 03/07/2018   ADD (attention deficit disorder) 03/05/2018   Memory changes 03/05/2018   Word finding difficulty 03/05/2018   Varicose vein of leg 06/17/2017   Primary insomnia 06/17/2017   Cervical herniated disc 06/17/2017   Recurrent cold sores 01/20/2016   Osteopenia 08/16/2015   Food intolerance 08/29/2014   Adult ADHD 08/25/2014   Depression 08/25/2014   Vitamin D deficiency 08/25/2014   Dyslexia 08/25/2014   Past Medical History:  Diagnosis Date   Abnormal Pap smear of cervix    prior to hysterectomy, pap smears normal after hysterectomy   ADD (attention deficit disorder)    Anxiety    Asthma    C. difficile colitis    Depression     Female bladder prolapse    Genital warts    Hyperlipidemia    Osteopenia    PONV (postoperative nausea and vomiting)    Thyroid disease    Vitamin D deficiency    Family History  Problem Relation Age of Onset   Thyroid disease Mother    Cancer Mother    Osteopenia Mother    Uterine cancer Maternal Grandmother    Breast cancer Maternal Grandmother    Alcoholism Other        granparents   Depression Other        aunts   Colon cancer Neg Hx    Esophageal cancer Neg Hx    Allergies  Allergen Reactions   E-Mycin [Erythromycin]     Rash   Lexapro [Escitalopram Oxalate]     Confused and disoriented.    Augmentin [Amoxicillin-Pot Clavulanate] Diarrhea    Contracted C.diff 14 days finishing augmentin  Noted diarrhea 3 days after starting augmentin    Lactose Intolerance (Gi)    Covid-19 Mrna Vaccine [employer]) [Covid-19 Mrna Vacc (Moderna)] Rash    Started on prednisone last night- vaccine on Saturday   Gadolinium Itching and Rash    12/13/21: pt developed itching and rash about 10 min post MRI constrast. 50 mg of benadryl given. AV, RN      ROS See HPI.    Objective:     BP 112/71   Pulse 76   Ht 5' 8.5" (1.74 m)  Wt 165 lb (74.8 kg)   SpO2 99%   BMI 24.72 kg/m  BP Readings from Last 3 Encounters:  06/15/22 112/71  05/11/22 100/68  04/16/22 120/84   Wt Readings from Last 3 Encounters:  06/15/22 165 lb (74.8 kg)  05/10/22 161 lb 6.4 oz (73.2 kg)  04/16/22 150 lb (68 kg)    ..    06/15/2022    8:08 AM 11/04/2021    3:04 PM 11/03/2021    2:25 PM 10/04/2019    3:37 PM 03/18/2019    9:40 AM  Depression screen PHQ 2/9  Decreased Interest 0 0 0 1 0  Down, Depressed, Hopeless 0 1 2 1  0  PHQ - 2 Score 0 1 2 2  0  Altered sleeping   2 1 1   Tired, decreased energy   2 2 1   Change in appetite   0 1 0  Feeling bad or failure about yourself    0 1 0  Trouble concentrating   1 2 3   Moving slowly or fidgety/restless   0 2 1  Suicidal thoughts   0 0 0  PHQ-9 Score   7  11 6   Difficult doing work/chores   Somewhat difficult Somewhat difficult Somewhat difficult   .[name]    10/04/2019    3:39 PM 03/18/2019    9:42 AM 07/31/2018    1:13 PM 06/11/2018   11:26 AM  GAD 7 : Generalized Anxiety Score  Nervous, Anxious, on Edge 1 1 1 3   Control/stop worrying 1 1 1 3   Worry too much - different things 1 1 0 3  Trouble relaxing 1 1 1 3   Restless 1 1 1 3   Easily annoyed or irritable 1 1 3 2   Afraid - awful might happen 1 0 0 2  Total GAD 7 Score 7 6 7 19   Anxiety Difficulty Somewhat difficult Somewhat difficult Somewhat difficult       Physical Exam Constitutional:      Appearance: Normal appearance.  HENT:     Head: Normocephalic.  Cardiovascular:     Rate and Rhythm: Normal rate and regular rhythm.     Pulses: Normal pulses.  Pulmonary:     Effort: Pulmonary effort is normal.     Breath sounds: Normal breath sounds.  Musculoskeletal:     Right lower leg: No edema.     Left lower leg: No edema.  Neurological:     General: No focal deficit present.     Mental Status: She is alert and oriented to person, place, and time.  Psychiatric:        Mood and Affect: Mood normal.          Assessment & Plan:  Marland KitchenMarland KitchenFaith was seen today for follow-up.  Diagnoses and all orders for this visit:  Adult ADHD -     amphetamine-dextroamphetamine (ADDERALL XR) 15 MG 24 hr capsule; Take 1 capsule by mouth every morning. -     amphetamine-dextroamphetamine (ADDERALL XR) 15 MG 24 hr capsule; Take 1 capsule by mouth every morning. -     amphetamine-dextroamphetamine (ADDERALL XR) 15 MG 24 hr capsule; Take 1 capsule by mouth every morning.  Colon cancer screening -     Cologuard  Reactive depression  Acute stress reaction -     ALPRAZolam (XANAX) 0.5 MG tablet; Take 1 tablet (0.5 mg total) by mouth at bedtime as needed for anxiety.  Adjustment insomnia -     Eszopiclone 3 MG TABS; Take 1 tablet (3  mg total) by mouth at bedtime. Take immediately before  bedtime  Recurrent cold sores -     valACYclovir (VALTREX) 1000 MG tablet; Take 2 tablets (2,000 mg total) by mouth 2 (two) times daily. For cold sore  Functional diarrhea -     dicyclomine (BENTYL) 10 MG capsule; Take 1 capsule (10 mg total) by mouth every 6 (six) hours as needed for spasms.   Discussed colonoscopy.  Pt would like to opt for cologuard.  Adderall refilled for 3 months Ok for 6 month follow up and next 3 month refill  Xanax/valtrex/bentyl/lunesta as needed.failed ambien.    Iran Planas, PA-C

## 2022-06-17 ENCOUNTER — Encounter: Payer: Self-pay | Admitting: Gastroenterology

## 2022-06-21 ENCOUNTER — Encounter: Payer: Self-pay | Admitting: Obstetrics and Gynecology

## 2022-06-21 ENCOUNTER — Ambulatory Visit (INDEPENDENT_AMBULATORY_CARE_PROVIDER_SITE_OTHER): Payer: BC Managed Care – PPO | Admitting: Obstetrics and Gynecology

## 2022-06-21 VITALS — BP 115/72 | HR 76

## 2022-06-21 DIAGNOSIS — N393 Stress incontinence (female) (male): Secondary | ICD-10-CM

## 2022-06-21 NOTE — Progress Notes (Signed)
[facility]  Date of Visit: 06/21/2022  History of Present Illness: Jacqueline Hughes is a 56 y.o. female scheduled today for a post-operative visit.   Surgery: s/p Robotic assisted laparoscopic lysis of adhesions, bilateral salpingo-oophorectomy, sacrocolpopexy, cystoscopy on 05/10/22  She passed her postoperative void trial.   Postoperative course has been uncomplicated.   Today she reports she is feeling well.   UTI in the last 6 weeks? No  Pain? No  She has not returned to her normal activity Vaginal bulge? No  Stress incontinence: Yes - has worsened some since the surgery Urgency/frequency: Yes - started having some urgency today Urge incontinence: No  Voiding dysfunction: No - feels that she is emptying well.  Bowel issues: No   Subjective Success: Do you usually have a bulge or something falling out that you can see or feel in the vaginal area? No  Retreatment Success: Any retreatment with surgery or pessary for any compartment? No   Pathology results: OVARY, LEFT, OOPHORECTOMY:  Conventional fibroma of an ovary.  There are no diagnostic features of malignancy.   B. OVARY AND FALLOPIAN TUBE, RIGHT, SALPINGO OOPHORECTOMY:  Serous cystoadenofibroma of an ovary.  There are no diagnostic features of malignancy.   Medications: She has a current medication list which includes the following prescription(s): alprazolam, amphetamine-dextroamphetamine, [START ON 07/15/2022] amphetamine-dextroamphetamine, [START ON 08/13/2022] amphetamine-dextroamphetamine, dicyclomine, estradiol, eszopiclone, fluoxetine, ondansetron, valacyclovir, and vitamin d (ergocalciferol).   Allergies: Patient is allergic to e-mycin [erythromycin], lexapro [escitalopram oxalate], augmentin [amoxicillin-pot clavulanate], lactose intolerance (gi), covid-19 mrna vaccine (pfizer) [covid-19 mrna vacc (moderna)], and gadolinium.   Physical Exam: BP 115/72   Pulse 76   Abdomen: soft, non-tender, without masses  or organomegaly Laparoscopic Incisions: healing well, no dehiscence.  Pelvic Examination: Vagina: Speculum exam shows normal mucosa. No tenderness along the anterior or posterior vagina. No apical tenderness. No pelvic masses. No visible or palpable mesh.  POP-Q: POP-Q  -3                                            Aa   -3                                           Ba  -7                                              C   3                                            Gh  4                                            Pb  7  tvl   -3                                            Ap  -3                                            Bp                                                 D   PVR: Patient voided and bladder scan was performed and PVR was 15m.   ---------------------------------------------------------  Assessment and Plan:  1. SUI (stress urinary incontinence, female)     - Pathology results were reviewed with the patient today and she verbalized understanding that the results were benign.  - Can resume regular activity including exercise and intercourse,  if desired.  - For SUI symptoms, discussed options of midurethral sling vs urethral bulking. She prefers the urethral bulking procedure due to quick recovery. She would like this done in the operating room under anesthesia. Will send request to surgery scheduler.  MJaquita Folds MD

## 2022-07-12 ENCOUNTER — Encounter: Payer: Self-pay | Admitting: Physician Assistant

## 2022-07-26 ENCOUNTER — Telehealth: Payer: Self-pay | Admitting: Obstetrics and Gynecology

## 2022-07-26 NOTE — Telephone Encounter (Signed)
Patient called office requesting to schedule a follow up appointment due to currently experiencing discomfort. She states she would like to be evaluated by the end of this week. Patient also mentions possible symptoms of UTI. Patient was offered to come by to leave urine sample for processing as soon as possible, but declines offer and states she would rather have urinalysis performed during evaluation. Patient has been scheduled for office visit on 07/29/22. Please review.

## 2022-07-29 ENCOUNTER — Encounter: Payer: Self-pay | Admitting: Obstetrics and Gynecology

## 2022-07-29 ENCOUNTER — Ambulatory Visit (INDEPENDENT_AMBULATORY_CARE_PROVIDER_SITE_OTHER): Payer: BC Managed Care – PPO | Admitting: Obstetrics and Gynecology

## 2022-07-29 ENCOUNTER — Other Ambulatory Visit: Payer: Self-pay | Admitting: Obstetrics and Gynecology

## 2022-07-29 VITALS — BP 122/80 | HR 76

## 2022-07-29 DIAGNOSIS — M62838 Other muscle spasm: Secondary | ICD-10-CM

## 2022-07-29 DIAGNOSIS — N362 Urethral caruncle: Secondary | ICD-10-CM | POA: Diagnosis not present

## 2022-07-29 DIAGNOSIS — N952 Postmenopausal atrophic vaginitis: Secondary | ICD-10-CM

## 2022-07-29 DIAGNOSIS — N3281 Overactive bladder: Secondary | ICD-10-CM

## 2022-07-29 MED ORDER — CYCLOBENZAPRINE HCL 10 MG PO TABS: 10.0000 mg | ORAL_TABLET | Freq: Three times a day (TID) | ORAL | 2 refills | Status: DC | PRN

## 2022-07-29 MED ORDER — CYCLOBENZAPRINE HCL 5 MG PO TABS: 5.0000 mg | ORAL_TABLET | Freq: Three times a day (TID) | ORAL | 1 refills | Status: DC | PRN

## 2022-07-29 MED ORDER — ESTRADIOL 0.1 MG/GM VA CREA: 0.5000 g | TOPICAL_CREAM | VAGINAL | 11 refills | Status: DC

## 2022-07-29 MED ORDER — MIRABEGRON ER 25 MG PO TB24: 25.0000 mg | ORAL_TABLET | Freq: Every day | ORAL | 5 refills | Status: DC

## 2022-07-29 NOTE — Patient Instructions (Signed)
Start vaginal estrogen therapy nightly for two weeks then 2 times weekly at night for treatment of vaginal atrophy (dryness of the vaginal tissues).  Please let us know if the prescription is too expensive and we can look for alternative options.

## 2022-07-29 NOTE — Progress Notes (Signed)
[facility]  Date of Visit: 07/29/2022  History of Present Illness: Ms. Jacqueline Hughes is a 56 y.o. female scheduled today for a post-operative visit.   Surgery: s/p Robotic assisted laparoscopic lysis of adhesions, bilateral salpingo-oophorectomy, sacrocolpopexy, cystoscopy on 05/10/22  Last week, she was having some lower abdominal tenderness and urgency. When she has urgency, she is urinating only small amounts. She noticed some pink tinged blood with wiping then started getting a feeling of relief. She also has an intermittent sensation of movement/ crawling in the vaginal- not painful or crampy.   Having tiny bits of leakage with stress. Still urinating every hour. Drinking about 3 cups of coffee, water throughout the day, decaf coffee in evening, occasional soda.   Medications: She has a current medication list which includes the following prescription(s): alprazolam, amphetamine-dextroamphetamine, amphetamine-dextroamphetamine, [START ON 08/13/2022] amphetamine-dextroamphetamine, dicyclomine, estradiol, [START ON 08/01/2022] estradiol, eszopiclone, fluoxetine, mirabegron er, ondansetron, valacyclovir, vitamin d (ergocalciferol), and cyclobenzaprine.   Allergies: Patient is allergic to e-mycin [erythromycin], lexapro [escitalopram oxalate], augmentin [amoxicillin-pot clavulanate], lactose intolerance (gi), covid-19 mrna vaccine (pfizer) [covid-19 mrna vacc (moderna)], and gadolinium.   Physical Exam: BP 122/80   Pulse 76   Abdomen: soft, non-tender, without masses or organomegaly  Pelvic Examination: Small caruncle at urethra. Vagina: Speculum exam shows normal mucosa. Mild tenderness of pelvic floor muscles bilaterally, increased tension present. No apical tenderness. No pelvic masses. No visible or palpable mesh.  POP-Q (06/21/22): POP-Q   -3                                            Aa   -3                                           Ba   -7                                               C    3                                            Gh   4                                            Pb   7                                            tvl    -3                                            Ap   -3  Bp                                                  D    POC urine: negative ---------------------------------------------------------  Assessment and Plan:  1. Levator spasm   2. Overactive bladder   3. Vaginal atrophy   4. Urethral caruncle     - Mild pelvic floor muscle tension which may be causing the "crawling" sensation. Prescribed flexeril 50m, which she can take up to 3 times per day.  - Discussed decreasing bladder irritants, specifically coffee, throughout the day. Also prescribed myrbetriq 260mand provided with samples.  - For urethral caruncle, recommended starting vaginal estrogen cream. If she does not see improvement, then we can consider resection at the same time as urethral bulking on 10/16.   Follow up after urethral bulking procedure.   MiJaquita FoldsMD

## 2022-08-03 ENCOUNTER — Encounter (HOSPITAL_BASED_OUTPATIENT_CLINIC_OR_DEPARTMENT_OTHER): Payer: Self-pay | Admitting: Obstetrics and Gynecology

## 2022-08-04 ENCOUNTER — Encounter (HOSPITAL_BASED_OUTPATIENT_CLINIC_OR_DEPARTMENT_OTHER): Payer: Self-pay | Admitting: Obstetrics and Gynecology

## 2022-08-04 NOTE — Progress Notes (Signed)
Spoke w/ via phone for pre-op interview--- pt Lab needs dos----  no (per anes)/  pre-op orders pending           Lab results------ no COVID test -----patient states asymptomatic no test needed Arrive at -------  0530 on 08-15-2022 NPO after MN NO Solid Food.  Clear liquids from MN until--- 0430 Med rec completed Medications to take morning of surgery ----- none Diabetic medication ----- n/a Patient instructed no nail polish to be worn day of surgery Patient instructed to bring photo id and insurance card day of surgery Patient aware to have Driver (ride ) / caregiver for 24 hours after surgery -- husband, Buyer, retail Patient Special Instructions ----- n/a Pre-Op special Istructions ----- sent inbox message to dr schroeder in epic, requested orders Patient verbalized understanding of instructions that were given at this phone interview. Patient denies shortness of breath, chest pain, fever, cough at this phone interview.

## 2022-08-15 ENCOUNTER — Ambulatory Visit (HOSPITAL_BASED_OUTPATIENT_CLINIC_OR_DEPARTMENT_OTHER): Payer: BC Managed Care – PPO | Admitting: Anesthesiology

## 2022-08-15 ENCOUNTER — Encounter (HOSPITAL_BASED_OUTPATIENT_CLINIC_OR_DEPARTMENT_OTHER)
Admission: RE | Disposition: A | Discharge status: Home / Self Care | Payer: Self-pay | Source: Home / Self Care | Attending: Obstetrics and Gynecology

## 2022-08-15 ENCOUNTER — Encounter (HOSPITAL_BASED_OUTPATIENT_CLINIC_OR_DEPARTMENT_OTHER): Payer: Self-pay | Admitting: Obstetrics and Gynecology

## 2022-08-15 ENCOUNTER — Other Ambulatory Visit: Payer: Self-pay

## 2022-08-15 ENCOUNTER — Ambulatory Visit (HOSPITAL_BASED_OUTPATIENT_CLINIC_OR_DEPARTMENT_OTHER)
Admission: RE | Admit: 2022-08-15 | Discharge: 2022-08-15 | Disposition: A | Discharge status: Home / Self Care | Payer: BC Managed Care – PPO | Attending: Obstetrics and Gynecology | Admitting: Obstetrics and Gynecology

## 2022-08-15 DIAGNOSIS — E039 Hypothyroidism, unspecified: Secondary | ICD-10-CM | POA: Insufficient documentation

## 2022-08-15 DIAGNOSIS — F418 Other specified anxiety disorders: Secondary | ICD-10-CM | POA: Insufficient documentation

## 2022-08-15 DIAGNOSIS — N3281 Overactive bladder: Secondary | ICD-10-CM | POA: Insufficient documentation

## 2022-08-15 DIAGNOSIS — Z87891 Personal history of nicotine dependence: Secondary | ICD-10-CM | POA: Insufficient documentation

## 2022-08-15 DIAGNOSIS — F988 Other specified behavioral and emotional disorders with onset usually occurring in childhood and adolescence: Secondary | ICD-10-CM | POA: Diagnosis not present

## 2022-08-15 DIAGNOSIS — M1990 Unspecified osteoarthritis, unspecified site: Secondary | ICD-10-CM | POA: Insufficient documentation

## 2022-08-15 DIAGNOSIS — N393 Stress incontinence (female) (male): Secondary | ICD-10-CM

## 2022-08-15 DIAGNOSIS — E785 Hyperlipidemia, unspecified: Secondary | ICD-10-CM | POA: Diagnosis not present

## 2022-08-15 DIAGNOSIS — J45909 Unspecified asthma, uncomplicated: Secondary | ICD-10-CM | POA: Diagnosis not present

## 2022-08-15 DIAGNOSIS — Z01818 Encounter for other preprocedural examination: Secondary | ICD-10-CM

## 2022-08-15 HISTORY — DX: Family history of other specified conditions: Z84.89

## 2022-08-15 HISTORY — DX: Unspecified osteoarthritis, unspecified site: M19.90

## 2022-08-15 HISTORY — DX: Personal history of other endocrine, nutritional and metabolic disease: Z86.39

## 2022-08-15 HISTORY — DX: Personal history of other diseases of the female genital tract: Z87.42

## 2022-08-15 HISTORY — DX: Urethral caruncle: N36.2

## 2022-08-15 HISTORY — DX: Generalized anxiety disorder: F41.1

## 2022-08-15 HISTORY — DX: Attention-deficit hyperactivity disorder, unspecified type: F90.9

## 2022-08-15 HISTORY — DX: Presence of spectacles and contact lenses: Z97.3

## 2022-08-15 HISTORY — DX: Overactive bladder: N32.81

## 2022-08-15 HISTORY — DX: Personal history of other diseases of the respiratory system: Z87.09

## 2022-08-15 SURGERY — INJECTION, BULKING AGENT, URETHRA
Anesthesia: Monitor Anesthesia Care | Site: Urethra

## 2022-08-15 MED ORDER — PROPOFOL 500 MG/50ML IV EMUL
INTRAVENOUS | Status: DC | PRN
  Administered 2022-08-15: 200 ug/kg/min via INTRAVENOUS

## 2022-08-15 MED ORDER — MIDAZOLAM HCL 5 MG/5ML IJ SOLN
INTRAMUSCULAR | Status: DC | PRN
  Administered 2022-08-15: 2 mg via INTRAVENOUS

## 2022-08-15 MED ORDER — SODIUM CHLORIDE 0.9 % IR SOLN: Status: DC | PRN

## 2022-08-15 MED ORDER — OXYCODONE HCL 5 MG/5ML PO SOLN: 5.0000 mg | Freq: Once | ORAL | Status: DC | PRN

## 2022-08-15 MED ORDER — MIDAZOLAM HCL 2 MG/2ML IJ SOLN
INTRAMUSCULAR | Status: AC
  Filled 2022-08-15: qty 2

## 2022-08-15 MED ORDER — CEFAZOLIN SODIUM-DEXTROSE 2-4 GM/100ML-% IV SOLN
2.0000 g | INTRAVENOUS | Status: AC
  Administered 2022-08-15: 2 g via INTRAVENOUS

## 2022-08-15 MED ORDER — FENTANYL CITRATE (PF) 100 MCG/2ML IJ SOLN
INTRAMUSCULAR | Status: DC | PRN
  Administered 2022-08-15: 25 ug via INTRAVENOUS

## 2022-08-15 MED ORDER — PROPOFOL 1000 MG/100ML IV EMUL
INTRAVENOUS | Status: AC
  Filled 2022-08-15: qty 100

## 2022-08-15 MED ORDER — ONDANSETRON HCL 4 MG/2ML IJ SOLN
INTRAMUSCULAR | Status: DC | PRN
  Administered 2022-08-15: 4 mg via INTRAVENOUS

## 2022-08-15 MED ORDER — HYDROMORPHONE HCL 1 MG/ML IJ SOLN: 0.2500 mg | INTRAMUSCULAR | Status: DC | PRN

## 2022-08-15 MED ORDER — LIDOCAINE HCL (PF) 2 % IJ SOLN
INTRAMUSCULAR | Status: AC
  Filled 2022-08-15: qty 5

## 2022-08-15 MED ORDER — CEFAZOLIN SODIUM-DEXTROSE 2-4 GM/100ML-% IV SOLN
INTRAVENOUS | Status: AC
  Filled 2022-08-15: qty 100

## 2022-08-15 MED ORDER — LIDOCAINE HCL (CARDIAC) PF 100 MG/5ML IV SOSY
PREFILLED_SYRINGE | INTRAVENOUS | Status: DC | PRN
  Administered 2022-08-15: 25 mg via INTRAVENOUS

## 2022-08-15 MED ORDER — OXYCODONE HCL 5 MG PO TABS: 5.0000 mg | ORAL_TABLET | Freq: Once | ORAL | Status: DC | PRN

## 2022-08-15 MED ORDER — PROMETHAZINE HCL 25 MG/ML IJ SOLN: 6.2500 mg | INTRAMUSCULAR | Status: DC | PRN

## 2022-08-15 MED ORDER — PROPOFOL 10 MG/ML IV BOLUS
INTRAVENOUS | Status: DC | PRN
  Administered 2022-08-15 (×3): 20 mg via INTRAVENOUS

## 2022-08-15 MED ORDER — FENTANYL CITRATE (PF) 100 MCG/2ML IJ SOLN
INTRAMUSCULAR | Status: AC
  Filled 2022-08-15: qty 2

## 2022-08-15 MED ORDER — STERILE WATER FOR IRRIGATION IR SOLN
Status: DC | PRN
  Administered 2022-08-15: 1800 mL

## 2022-08-15 MED ORDER — LACTATED RINGERS IV SOLN: INTRAVENOUS | Status: DC

## 2022-08-15 MED ORDER — PROPOFOL 10 MG/ML IV BOLUS
INTRAVENOUS | Status: AC
  Filled 2022-08-15: qty 20

## 2022-08-15 MED ORDER — DEXAMETHASONE SODIUM PHOSPHATE 10 MG/ML IJ SOLN
INTRAMUSCULAR | Status: AC
  Filled 2022-08-15: qty 1

## 2022-08-15 MED ORDER — DEXAMETHASONE SODIUM PHOSPHATE 4 MG/ML IJ SOLN
INTRAMUSCULAR | Status: DC | PRN
  Administered 2022-08-15: 5 mg via INTRAVENOUS

## 2022-08-15 MED ORDER — ONDANSETRON HCL 4 MG/2ML IJ SOLN
INTRAMUSCULAR | Status: AC
  Filled 2022-08-15: qty 2

## 2022-08-15 SURGICAL SUPPLY — 13 items
BAG DRAIN URO-CYSTO SKYTR STRL (DRAIN) IMPLANT
BAG DRN UROCATH (DRAIN) ×1
CATH ROBINSON RED A/P 12FR (CATHETERS) IMPLANT
DRAPE HYSTEROSCOPY (MISCELLANEOUS) ×1 IMPLANT
GLOVE BIO SURGEON STRL SZ 6 (GLOVE) ×1 IMPLANT
GLOVE BIOGEL PI IND STRL 6.5 (GLOVE) ×1 IMPLANT
GOWN STRL REUS W/TWL LRG LVL3 (GOWN DISPOSABLE) ×1 IMPLANT
KIT TURNOVER CYSTO (KITS) ×1 IMPLANT
MANIFOLD NEPTUNE II (INSTRUMENTS) IMPLANT
PACK CYSTO (CUSTOM PROCEDURE TRAY) ×1 IMPLANT
SYSTEM URETHRAL BULK BULKAMID (Female Continence) IMPLANT
TUBE CONNECTING 12X1/4 (SUCTIONS) IMPLANT
WATER STERILE IRR 3000ML UROMA (IV SOLUTION) IMPLANT

## 2022-08-15 NOTE — Transfer of Care (Signed)
Immediate Anesthesia Transfer of Care Note  Patient: Jacqueline Hughes  Procedure(s) Performed: Procedure(s) (LRB): URETHRAL BULKING (N/A)  Patient Location: PACU  Anesthesia Type: MAC  Level of Consciousness: awake, alert  and oriented  Airway & Oxygen Therapy: Patient Spontanous Breathing   Post-op Assessment: Report given to PACU RN and Post -op Vital signs reviewed and stable  Post vital signs: Reviewed and stable  Complications: No apparent anesthesia complications  Last Vitals:  Vitals Value Taken Time  BP 112/82 08/15/22 0805  Temp    Pulse 77 08/15/22 0807  Resp 16 08/15/22 0807  SpO2 93 % 08/15/22 0807  Vitals shown include unvalidated device data.  Last Pain:  Vitals:   08/15/22 0607  TempSrc: Oral  PainSc: 0-No pain      Patients Stated Pain Goal: 5 (58/25/18 9842)  Complications: No notable events documented.

## 2022-08-15 NOTE — Progress Notes (Signed)
Patient voided 100 ml of clear pink urine. Post void bladder scan was 0 ml.

## 2022-08-15 NOTE — H&P (Signed)
[facility] Pre-Operative H&P  Subjective Chief Complaint: Jacqueline Hughes presents for a preoperative encounter.   History of Present Illness: Jacqueline Hughes is a 56 y.o. female who presents for preoperative visit.  She is scheduled to undergo cystoscopy with urethral bulking on 08/15/22.  Her symptoms include urinary leakage.   CMG Interpretation: CMG showed normal sensation, and normal cystometric capacity. Findings positive for stress incontinence, negative for detrusor overactivity. Prior to procedure, patient voided and PVR was 464m.   Past Medical History:  Diagnosis Date   ADHD (attention deficit hyperactivity disorder)    Arthritis    Depression    Family history of adverse reaction to anesthesia    mother--- ponv   GAD (generalized anxiety disorder)    History of abnormal cervical Pap smear    prior to hysterectomy, pap smears normal after hysterectomy   History of asthma    History of Clostridium difficile colitis 10/19/2021   take augmentin   History of hypothyroidism    Hyperlipidemia    OAB (overactive bladder)    Osteopenia    PONV (postoperative nausea and vomiting)    Urethral caruncle    Vitamin D deficiency    Wears glasses      Past Surgical History:  Procedure Laterality Date   ABDOMINAL HY[place]CTOMY  [place]     [facility]   CYSTOSCOPY N/A 05/10/2022   Procedure: CYSTOSCOPY;  Surgeon: SJaquita Folds MD;  [facility]  Service: Gynecology;  Laterality: N/A;   LAPAROTOMY     LEEP     ROBOTIC ASSISTED LAPAROSCOPIC SACROCOLPOPEXY N/A 05/10/2022   Procedure: XI ROBOTIC ASSISTED LAPAROSCOPIC SACROCOLPOPEXY;  Surgeon: SJaquita Folds MD;  [facility]  Service: Gynecology;  Laterality: N/A;   ROBOTIC ASSISTED SALPINGO OOPHERECTOMY Bilateral  05/10/2022   Procedure: XI ROBOTIC ASSISTED SALPINGO OOPHORECTOMY AND LYSIS OF ADHESIONS;  Surgeon: SJaquita Folds MD;  [facility]  Service: Gynecology;  Laterality: Bilateral;  Total time requested is 3.5 hours    is allergic to e-mycin [erythromycin], lexapro [escitalopram oxalate], augmentin [amoxicillin-pot clavulanate], lactose intolerance (gi), covid-19 mrna vaccine (pfizer) [covid-19 mrna vacc (moderna)], and gadolinium.   Family History  Problem Relation Age of Onset   Thyroid disease Mother    Cancer Mother    Osteopenia Mother    Uterine cancer Maternal Grandmother    Breast cancer Maternal Grandmother    Alcoholism Other        granparents   Depression Other        aunts   Colon cancer Neg Hx    Esophageal cancer Neg Hx     Social History   Tobacco Use   Smoking status: Former    Types: Cigarettes    Quit date: 08/25/2004    Years since quitting: 17.9    Passive exposure: Never   Smokeless tobacco: Never  Vaping Use   Vaping Use: Never used  Substance Use Topics   Alcohol use: Yes    Comment: occasionally   Drug use: No     Review of Systems was negative for a full 10 system review except as noted in the History of Present Illness.   Current Facility-Administered Medications:    ceFAZolin (ANCEF) IVPB 2g/100 mL premix, 2 g, Intravenous, On Call to OR, SJaquita Folds MD  lactated ringers infusion, , Intravenous, Continuous, Pervis Hocking, DO, Last Rate: 50 mL/hr at 08/15/22 0625, New Bag at 08/15/22 0625   Objective Vitals:   08/15/22 0607  BP: 114/74  Pulse: 70  Resp: 16  Temp: 97.7 F (36.5 C)  SpO2: 99%    Gen: NAD CV: S1 S2 RRR Lungs: Clear to auscultation bilaterally Abd: soft, nontender     Assessment/ Plan  The patient is a 56 y.o. year old scheduled to undergo cystoscopy with urethral bulking.     [name], MD

## 2022-08-15 NOTE — Op Note (Signed)
Operative Note  Preoperative Diagnosis: stress urinary incontinence  Postoperative Diagnosis: same  Procedures performed:  Cystoscopy with urethral bulking  Implants:  Implant Name Type Inv. Item Serial No. Manufacturer Lot No. LRB No. Used Action  SYSTEM Hinda Kehr - [id number] Female Continence SYSTEM URETHRAL Gaines [id number] N/A 1 Implanted    Attending Surgeon: Sherlene Shams, MD  Anesthesia: MAC  Findings: Normal urethral mucosa with improved coaptation after injection of Bulkamid   Specimens: none  Estimated blood loss: minimal  IV fluids: 100 mL  Urine output: 20 mL  Complications: none  Procedure in Detail:  After informed consent was obtained the patient was taken to the operating room where MAC anesthesia was induced and found to be adequate.  A red rubber was placed to drain urine and then removed. A 0 degree cystoscope was inserted into the urethra into the bladder.   The needle was primed.  The Bulkamid cystoscope was inserted to the level of the bladder neck.  The needle was inserted 2 cm and the scope was pulled back into the urethra 2 cm.  The needle was inserted bevel up at the 5 o'clock position and the Bulkamid was injected to obtain coaptation.  This was repeated at the 2 o'clock,  10 o'clock and 7 o'clock positions.   A total of 2- 81m syringes were used and good circumferential coaptation was noted.  The patient tolerated the procedure well was taken to recovery room in stable condition all counts were correct x2.  MJaquita Folds MD

## 2022-08-15 NOTE — Anesthesia Postprocedure Evaluation (Signed)
Anesthesia Post Note  Patient: Jacqueline Hughes  Procedure(s) Performed: URETHRAL BULKING (Urethra)     Patient location during evaluation: PACU Anesthesia Type: MAC Level of consciousness: awake and alert Pain management: pain level controlled Vital Signs Assessment: post-procedure vital signs reviewed and stable Respiratory status: spontaneous breathing, nonlabored ventilation and respiratory function stable Cardiovascular status: blood pressure returned to baseline and stable Postop Assessment: no apparent nausea or vomiting Anesthetic complications: no   No notable events documented.  Last Vitals:  Vitals:   08/15/22 0830 08/15/22 0852  BP: 99/75 117/82  Pulse: 72   Resp: 16 15  Temp:  36.6 C  SpO2: 97% 98%    Last Pain:  Vitals:   08/15/22 0852  TempSrc:   PainSc: 0-No pain                 Lynda Rainwater

## 2022-08-15 NOTE — Discharge Instructions (Addendum)
Taking Care of Yourself after Bulkamid  Drink plenty of water for a day or two following your procedure. Try to have about 8 ounces (one cup) at a time, and do this 6 times or more per day unless you have fluid restrictitons AVOID irritative beverages such as coffee, tea, soda, alcoholic or citrus drinks for a day or two, as this may cause burning with urination.  For the first 1-2 days after the procedure, your urine may be pink or red in color. You may have some blood in your urine as a normal side effect of the procedure. Large amounts of bleeding or difficulty urinating are NOT normal. Call the nurse line if this happens or go to the nearest Emergency Room if the bleeding is heavy or you cannot urinate at all and it is after hours. If you need to be catheterized afterward, ask for a pediatric catheter to be used (size 10 or 12-French) so the material is not pushed out of place.   You may experience some discomfort or a burning sensation with urination after having this procedure. You can use over the counter Azo or pyridium to help with burning and follow the instructions on the packaging. If it does not improve within 1-2 days, or other symptoms appear (fever, chills, or difficulty urinating) call the office to speak to a nurse.  You may return to normal daily activities such as work, school, driving, exercising and housework on the day after the procedure.        Post Anesthesia Home Care Instructions  Activity: Get plenty of rest for the remainder of the day. A responsible individual must stay with you for 24 hours following the procedure.  For the next 24 hours, DO NOT: -Drive a car -Paediatric nurse -Drink alcoholic beverages -Take any medication unless instructed by your physician -Make any legal decisions or sign important papers.  Meals: Start with liquid foods such as gelatin or soup. Progress to regular foods as tolerated. Avoid greasy, spicy, heavy foods. If nausea and/or  vomiting occur, drink only clear liquids until the nausea and/or vomiting subsides. Call your physician if vomiting continues.  Special Instructions/Symptoms: Your throat may feel dry or sore from the anesthesia or the breathing tube placed in your throat during surgery. If this causes discomfort, gargle with warm salt water. The discomfort should disappear within 24 hours.

## 2022-08-15 NOTE — Anesthesia Preprocedure Evaluation (Signed)
Anesthesia Evaluation  Patient identified by MRN, date of birth, ID band Patient awake    Reviewed: Allergy & Precautions, NPO status , Patient's Chart, lab work & pertinent test results  History of Anesthesia Complications (+) PONV and history of anesthetic complications  Airway Mallampati: II  TM Distance: >3 FB Neck ROM: Full    Dental  (+) Teeth Intact, Dental Advisory Given   Pulmonary asthma , former smoker,    Pulmonary exam normal breath sounds clear to auscultation       Cardiovascular negative cardio ROS Normal cardiovascular exam Rhythm:Regular Rate:Normal     Neuro/Psych PSYCHIATRIC DISORDERS Anxiety Depression negative neurological ROS     GI/Hepatic negative GI ROS, Neg liver ROS,   Endo/Other  negative endocrine ROS  Renal/GU negative Renal ROS  Female GU complaint     Musculoskeletal  (+) Arthritis , Osteoarthritis,    Abdominal   Peds  (+) ATTENTION DEFICIT DISORDER WITHOUT HYPERACTIVITY Hematology negative hematology ROS (+)   Anesthesia Other Findings Day of surgery medications reviewed with the patient.  Reproductive/Obstetrics ovarian mass; vaginal vault prolapse after hysterectomy; cystocele                             Anesthesia Physical  Anesthesia Plan  ASA: 2  Anesthesia Plan: MAC   Post-op Pain Management: Minimal or no pain anticipated   Induction: Intravenous  PONV Risk Score and Plan: 3 and Midazolam, Dexamethasone, Ondansetron and Treatment may vary due to age or medical condition  Airway Management Planned: Simple Face Mask  Additional Equipment:   Intra-op Plan:   Post-operative Plan:   Informed Consent: I have reviewed the patients History and Physical, chart, labs and discussed the procedure including the risks, benefits and alternatives for the proposed anesthesia with the patient or authorized representative who has indicated his/her  understanding and acceptance.     Dental advisory given  Plan Discussed with: CRNA  Anesthesia Plan Comments: (2nd PIV after induction)        Anesthesia Quick Evaluation

## 2022-08-15 NOTE — Anesthesia Procedure Notes (Signed)
Procedure Name: MAC Date/Time: 08/15/2022 7:35 AM  Performed by: Mechele Claude, CRNAPre-anesthesia Checklist: Patient identified, Emergency Drugs available, Suction available and Patient being monitored Oxygen Delivery Method: Simple face mask Airway Equipment and Method: Oral airway Placement Confirmation: positive ETCO2 and breath sounds checked- equal and bilateral

## 2022-08-16 ENCOUNTER — Telehealth: Payer: Self-pay | Admitting: Obstetrics and Gynecology

## 2022-08-16 NOTE — Telephone Encounter (Signed)
Jacqueline Hughes underwent cystoscopy with urethral bulking on 08/15/22.   She passed her voiding trial.  PVR by bladder scan was 32m.   She was discharged without a catheter. Please call her for a routine post op check. Thanks!  MJaquita Folds MD

## 2022-08-16 NOTE — Telephone Encounter (Signed)
LMOVM for pt to call office

## 2022-08-17 ENCOUNTER — Encounter: Payer: Self-pay | Admitting: *Deleted

## 2022-08-22 NOTE — Telephone Encounter (Signed)
LM on the VM for the pt to call back

## 2022-09-08 ENCOUNTER — Encounter: Payer: Self-pay | Admitting: Obstetrics and Gynecology

## 2022-09-08 ENCOUNTER — Ambulatory Visit (INDEPENDENT_AMBULATORY_CARE_PROVIDER_SITE_OTHER): Payer: BC Managed Care – PPO | Admitting: Obstetrics and Gynecology

## 2022-09-08 DIAGNOSIS — N393 Stress incontinence (female) (male): Secondary | ICD-10-CM

## 2022-09-08 DIAGNOSIS — Z01818 Encounter for other preprocedural examination: Secondary | ICD-10-CM

## 2022-09-08 MED ORDER — ACETAMINOPHEN 500 MG PO TABS: 500.0000 mg | ORAL_TABLET | Freq: Four times a day (QID) | ORAL | 0 refills | Status: DC | PRN

## 2022-09-08 MED ORDER — IBUPROFEN 600 MG PO TABS: 600.0000 mg | ORAL_TABLET | Freq: Four times a day (QID) | ORAL | 0 refills | Status: DC | PRN

## 2022-09-08 MED ORDER — OXYCODONE HCL 5 MG PO TABS: 5.0000 mg | ORAL_TABLET | ORAL | 0 refills | Status: DC | PRN

## 2022-09-08 NOTE — Progress Notes (Signed)
[facility]  Date of Visit: 09/08/2022  History of Present Illness: Ms. Raupp is a 56 y.o. female scheduled today for a post-operative visit.   Surgery: s/p urethral bulking on 08/15/22, and s/p Robotic assisted laparoscopic lysis of adhesions, bilateral salpingo-oophorectomy, sacrocolpopexy, cystoscopy on 05/10/22   She passed her postoperative void trial.   Postoperative course has been uncomplicated.   Today she reports she has not had any improvement in her leakage symptoms. She almost feels like she has had worsening of symptoms and has needed to increase her pad size. She has been more active recently, and is getting back to regular activity after her prior surgery. Leakage only happens with movement- lifting, bending, etc. She has some urgency but does not leak with urge.    Medications: She has a current medication list which includes the following prescription(s): acetaminophen, ibuprofen, oxycodone, alprazolam, amphetamine-dextroamphetamine, amphetamine-dextroamphetamine, cyclobenzaprine, dicyclomine, estradiol, eszopiclone, fluoxetine, multivitamin women, probiotic product, valacyclovir, gemtesa, and vitamin d (ergocalciferol).   Allergies: Patient is allergic to e-mycin [erythromycin], lexapro [escitalopram oxalate], augmentin [amoxicillin-pot clavulanate], lactose intolerance (gi), covid-19 mrna vaccine (pfizer) [covid-19 mrna vacc (moderna)], and gadolinium.   Physical Exam: There were no vitals taken for this visit.   Gen: AAO x3  POP-Q: deferred  ---------------------------------------------------------  Assessment and Plan:  1. SUI (stress urinary incontinence, female)   2. Pre-op evaluation    - We reviewed that the sling would be the best option for her since she did not see improvement with urethral bulking, and she would like to proceed.   Plan for surgery: Exam under anesthesia, midurethral sling, cystoscopy  - We reviewed the patient's specific  anatomic and functional findings, with the assistance of diagrams, and together finalized the above procedure. The planned surgical procedures were discussed along with the surgical risks outlined below, which were also provided on a detailed handout. Additional treatment options including expectant management, conservative management, medical management were discussed where appropriate.  We reviewed the benefits and risks of each treatment option.   General Surgical Risks: For all procedures, there are risks of bleeding, infection, damage to surrounding organs including but not limited to bowel, bladder, blood vessels, ureters and nerves, and need for further surgery if an injury were to occur. These risks are all low with minimally invasive surgery.   Sling: The effectiveness of a midurethral vaginal mesh sling is approximately 85%, and thus, there will be times when you may leak urine after surgery, especially if your bladder is full or if you have a strong cough. There is a balance between making the sling tight enough to treat your leakage but not too tight so that you have long-term difficulty emptying your bladder. A mesh sling will not directly treat overactive bladder/urge incontinence and may worsen it.  There is an FDA safety notification on vaginal mesh procedures for prolapse but NOT mesh slings. We have extensive experience and training with mesh placement and we have close postoperative follow up to identify any potential complications from mesh. It is important to realize that this mesh is a permanent implant that cannot be easily removed. There are rare risks of mesh exposure (2-4%), pain with intercourse (0-7%), and infection (<1%). The risk of mesh exposure if more likely in a woman with risks for poor healing (prior radiation, poorly controlled diabetes, or immunocompromised). The risk of new or worsened chronic pain after mesh implant is more common in women with baseline chronic pain  and/or poorly controlled anxiety or depression. Approximately 2-4% of  patients will experience longer-term post-operative voiding dysfunction that may require surgical revision of the sling. We also reviewed that postoperatively, her stream may not be as strong as before surgery.    - For preop Visit:  She is required to have a visit within 30 days of her surgery.   Today we reviewed pre-operative preparation, peri-operative expectations, and post-operative instructions/recovery.  She was provided with instructional handouts. She understands not to take aspirin (>1m) or NSAIDs 7 days prior to surgery. Prescriptions provided for: Oxycodone 530m Ibuprofen 60067mTylenol 500m69miralax.   - Medical clearance: not required  - Anticoagulant use: No - Medicaid Hysterectomy form: n/a - Accepts blood transfusion: Yes - Expected length of stay: outpatient  Request sent for surgery scheduling.   MichJaquita Folds

## 2022-09-27 ENCOUNTER — Encounter (HOSPITAL_BASED_OUTPATIENT_CLINIC_OR_DEPARTMENT_OTHER): Payer: Self-pay | Admitting: Obstetrics and Gynecology

## 2022-09-27 NOTE — Progress Notes (Signed)
Spoke w/ via phone for pre-op interview--- Jacqueline Hughes----   Surgeon orders pending, no anesthesia labs needed.            Lab results------ COVID test -----patient states asymptomatic no test needed Arrive at -------1300 NPO after MN NO Solid Food.  Clear liquids from MN until---1200 Med rec completed Medications to take morning of surgery -----NONE Diabetic medication ----- Patient instructed no nail polish to be worn day of surgery Patient instructed to bring photo id and insurance card day of surgery Patient aware to have Driver (ride ) / caregiver Ella Jubilee   for 24 hours after surgery  Patient Special Instructions ----- Pre-Op special Istructions ----- Patient verbalized understanding of instructions that were given at this phone interview. Patient denies shortness of breath, chest pain, fever, cough at this phone interview.

## 2022-09-30 NOTE — Progress Notes (Signed)
Patient was aware of time change to 1130. Clear liquids until 1030

## 2022-10-03 ENCOUNTER — Ambulatory Visit (HOSPITAL_BASED_OUTPATIENT_CLINIC_OR_DEPARTMENT_OTHER): Payer: BC Managed Care – PPO | Admitting: Anesthesiology

## 2022-10-03 ENCOUNTER — Other Ambulatory Visit: Payer: Self-pay

## 2022-10-03 ENCOUNTER — Ambulatory Visit (HOSPITAL_BASED_OUTPATIENT_CLINIC_OR_DEPARTMENT_OTHER)
Admission: RE | Admit: 2022-10-03 | Discharge: 2022-10-03 | Disposition: A | Discharge status: Home / Self Care | Payer: BC Managed Care – PPO | Attending: Obstetrics and Gynecology | Admitting: Obstetrics and Gynecology

## 2022-10-03 ENCOUNTER — Encounter (HOSPITAL_BASED_OUTPATIENT_CLINIC_OR_DEPARTMENT_OTHER): Payer: Self-pay | Admitting: Obstetrics and Gynecology

## 2022-10-03 ENCOUNTER — Encounter (HOSPITAL_BASED_OUTPATIENT_CLINIC_OR_DEPARTMENT_OTHER)
Admission: RE | Disposition: A | Discharge status: Home / Self Care | Payer: Self-pay | Source: Home / Self Care | Attending: Obstetrics and Gynecology

## 2022-10-03 DIAGNOSIS — M1990 Unspecified osteoarthritis, unspecified site: Secondary | ICD-10-CM | POA: Diagnosis not present

## 2022-10-03 DIAGNOSIS — N393 Stress incontinence (female) (male): Secondary | ICD-10-CM | POA: Diagnosis present

## 2022-10-03 DIAGNOSIS — N3281 Overactive bladder: Secondary | ICD-10-CM | POA: Diagnosis not present

## 2022-10-03 DIAGNOSIS — Z87891 Personal history of nicotine dependence: Secondary | ICD-10-CM | POA: Diagnosis not present

## 2022-10-03 HISTORY — PX: CYSTOSCOPY: SHX5120

## 2022-10-03 HISTORY — PX: BLADDER SUSPENSION: SHX72

## 2022-10-03 SURGERY — URETHROPEXY, USING TRANSVAGINAL TAPE
Anesthesia: General | Site: Vagina

## 2022-10-03 MED ORDER — MIDAZOLAM HCL 2 MG/2ML IJ SOLN
INTRAMUSCULAR | Status: AC
  Filled 2022-10-03: qty 2

## 2022-10-03 MED ORDER — HEMOSTATIC AGENTS (NO CHARGE) OPTIME
TOPICAL | Status: DC | PRN
  Administered 2022-10-03: 1

## 2022-10-03 MED ORDER — OXYCODONE HCL 5 MG/5ML PO SOLN: 5.0000 mg | Freq: Once | ORAL | Status: AC | PRN

## 2022-10-03 MED ORDER — KETOROLAC TROMETHAMINE 30 MG/ML IJ SOLN
INTRAMUSCULAR | Status: DC | PRN
  Administered 2022-10-03: 30 mg via INTRAVENOUS

## 2022-10-03 MED ORDER — OXYCODONE HCL 5 MG PO TABS
5.0000 mg | ORAL_TABLET | Freq: Once | ORAL | Status: AC | PRN
  Administered 2022-10-03: 5 mg via ORAL

## 2022-10-03 MED ORDER — FENTANYL CITRATE (PF) 100 MCG/2ML IJ SOLN
INTRAMUSCULAR | Status: AC
  Filled 2022-10-03: qty 2

## 2022-10-03 MED ORDER — ACETAMINOPHEN 500 MG PO TABS
ORAL_TABLET | ORAL | Status: AC
  Filled 2022-10-03: qty 2

## 2022-10-03 MED ORDER — PROPOFOL 500 MG/50ML IV EMUL
INTRAVENOUS | Status: DC | PRN
  Administered 2022-10-03: 150 ug/kg/min via INTRAVENOUS

## 2022-10-03 MED ORDER — DEXAMETHASONE SODIUM PHOSPHATE 4 MG/ML IJ SOLN
INTRAMUSCULAR | Status: DC | PRN
  Administered 2022-10-03: 8 mg via INTRAVENOUS

## 2022-10-03 MED ORDER — SODIUM CHLORIDE 0.9 % IR SOLN
Status: DC | PRN
  Administered 2022-10-03: 500 mL
  Administered 2022-10-03: 1000 mL

## 2022-10-03 MED ORDER — LIDOCAINE-EPINEPHRINE 1 %-1:100000 IJ SOLN
INTRAMUSCULAR | Status: DC | PRN
  Administered 2022-10-03: 10 mL

## 2022-10-03 MED ORDER — PROPOFOL 10 MG/ML IV BOLUS
INTRAVENOUS | Status: DC | PRN
  Administered 2022-10-03: 200 mg via INTRAVENOUS

## 2022-10-03 MED ORDER — LIDOCAINE HCL (CARDIAC) PF 100 MG/5ML IV SOSY
PREFILLED_SYRINGE | INTRAVENOUS | Status: DC | PRN
  Administered 2022-10-03: 60 mg via INTRAVENOUS

## 2022-10-03 MED ORDER — CEFAZOLIN SODIUM-DEXTROSE 2-4 GM/100ML-% IV SOLN
INTRAVENOUS | Status: AC
  Filled 2022-10-03: qty 100

## 2022-10-03 MED ORDER — AMISULPRIDE (ANTIEMETIC) 5 MG/2ML IV SOLN: 10.0000 mg | Freq: Once | INTRAVENOUS | Status: DC | PRN

## 2022-10-03 MED ORDER — STERILE WATER FOR IRRIGATION IR SOLN
Status: DC | PRN
  Administered 2022-10-03: 500 mL

## 2022-10-03 MED ORDER — SCOPOLAMINE 1 MG/3DAYS TD PT72
1.0000 | MEDICATED_PATCH | TRANSDERMAL | Status: DC
  Administered 2022-10-03: 1.5 mg via TRANSDERMAL

## 2022-10-03 MED ORDER — MIDAZOLAM HCL 2 MG/2ML IJ SOLN
INTRAMUSCULAR | Status: DC | PRN
  Administered 2022-10-03: 2 mg via INTRAVENOUS

## 2022-10-03 MED ORDER — LACTATED RINGERS IV SOLN: INTRAVENOUS | Status: DC

## 2022-10-03 MED ORDER — OXYCODONE HCL 5 MG PO TABS
ORAL_TABLET | ORAL | Status: AC
  Filled 2022-10-03: qty 1

## 2022-10-03 MED ORDER — CEFAZOLIN SODIUM-DEXTROSE 2-4 GM/100ML-% IV SOLN
2.0000 g | INTRAVENOUS | Status: AC
  Administered 2022-10-03: 2 g via INTRAVENOUS

## 2022-10-03 MED ORDER — ACETAMINOPHEN 500 MG PO TABS
1000.0000 mg | ORAL_TABLET | ORAL | Status: AC
  Administered 2022-10-03: 1000 mg via ORAL

## 2022-10-03 MED ORDER — POVIDONE-IODINE 10 % EX SWAB: 2.0000 | Freq: Once | CUTANEOUS | Status: DC

## 2022-10-03 MED ORDER — FENTANYL CITRATE (PF) 100 MCG/2ML IJ SOLN: 25.0000 ug | INTRAMUSCULAR | Status: DC | PRN

## 2022-10-03 MED ORDER — ONDANSETRON HCL 4 MG/2ML IJ SOLN
INTRAMUSCULAR | Status: DC | PRN
  Administered 2022-10-03: 4 mg via INTRAVENOUS

## 2022-10-03 MED ORDER — FENTANYL CITRATE (PF) 100 MCG/2ML IJ SOLN
INTRAMUSCULAR | Status: DC | PRN
  Administered 2022-10-03: 25 ug via INTRAVENOUS
  Administered 2022-10-03: 50 ug via INTRAVENOUS
  Administered 2022-10-03: 25 ug via INTRAVENOUS

## 2022-10-03 MED ORDER — SCOPOLAMINE 1 MG/3DAYS TD PT72
MEDICATED_PATCH | TRANSDERMAL | Status: AC
  Filled 2022-10-03: qty 1

## 2022-10-03 SURGICAL SUPPLY — 40 items
ADH SKN CLS APL DERMABOND .7 (GAUZE/BANDAGES/DRESSINGS) ×2
AGENT HMST KT MTR STRL THRMB (HEMOSTASIS) ×2
BLADE CLIPPER SENSICLIP SURGIC (BLADE) ×2 IMPLANT
BLADE SURG 15 STRL LF DISP TIS (BLADE) ×2 IMPLANT
BLADE SURG 15 STRL SS (BLADE) ×2
CATH FOLEY 2WAY SLVR  5CC 14FR (CATHETERS) ×2
CATH FOLEY 2WAY SLVR 5CC 14FR (CATHETERS) IMPLANT
DERMABOND ADVANCED .7 DNX12 (GAUZE/BANDAGES/DRESSINGS) ×2 IMPLANT
ELECT REM PT RETURN 9FT ADLT (ELECTROSURGICAL) ×2
ELECTRODE REM PT RTRN 9FT ADLT (ELECTROSURGICAL) IMPLANT
GLOVE BIOGEL PI IND STRL 6.5 (GLOVE) ×2 IMPLANT
GLOVE BIOGEL PI IND STRL 7.0 (GLOVE) ×2 IMPLANT
GLOVE ECLIPSE 6.0 STRL STRAW (GLOVE) ×2 IMPLANT
GLOVE SURG SS PI 6.5 STRL IVOR (GLOVE) IMPLANT
GLOVE SURG SS PI 7.5 STRL IVOR (GLOVE) IMPLANT
GOWN STRL REUS W/TWL LRG LVL3 (GOWN DISPOSABLE) ×2 IMPLANT
HIBICLENS CHG 4% 4OZ BTL (MISCELLANEOUS) ×2 IMPLANT
HOLDER FOLEY CATH W/STRAP (MISCELLANEOUS) ×2 IMPLANT
IV NS 1000ML (IV SOLUTION) ×2
IV NS 1000ML BAXH (IV SOLUTION) IMPLANT
KIT TURNOVER CYSTO (KITS) ×2 IMPLANT
MANIFOLD NEPTUNE II (INSTRUMENTS) ×2 IMPLANT
NEEDLE HYPO 22GX1.5 SAFETY (NEEDLE) ×2 IMPLANT
NS IRRIG 500ML POUR BTL (IV SOLUTION) IMPLANT
PACK CYSTO (CUSTOM PROCEDURE TRAY) ×2 IMPLANT
PACK VAGINAL WOMENS (CUSTOM PROCEDURE TRAY) ×2 IMPLANT
RETRACTOR LONE STAR DISPOSABLE (INSTRUMENTS) ×2 IMPLANT
RETRACTOR STAY HOOK 5MM (MISCELLANEOUS) ×2 IMPLANT
SET IRRIG Y TYPE TUR BLADDER L (SET/KITS/TRAYS/PACK) ×2 IMPLANT
SPIKE FLUID TRANSFER (MISCELLANEOUS) ×2 IMPLANT
SUCTION FRAZIER HANDLE 10FR (MISCELLANEOUS) ×2
SUCTION TUBE FRAZIER 10FR DISP (MISCELLANEOUS) ×2 IMPLANT
SURGIFLO W/THROMBIN 8M KIT (HEMOSTASIS) IMPLANT
SUT VIC AB 2-0 SH 27 (SUTURE) ×4
SUT VIC AB 2-0 SH 27XBRD (SUTURE) ×2 IMPLANT
SYR BULB EAR ULCER 3OZ GRN STR (SYRINGE) ×2 IMPLANT
SYS SLING ADV FIT BLUE TRNSVAG (Sling) IMPLANT
TOWEL OR 17X26 10 PK STRL BLUE (TOWEL DISPOSABLE) ×2 IMPLANT
TRAY FOLEY W/BAG SLVR 14FR (SET/KITS/TRAYS/PACK) ×2 IMPLANT
WATER STERILE IRR 500ML POUR (IV SOLUTION) IMPLANT

## 2022-10-03 NOTE — H&P (Signed)
[facility] Pre-Operative H&P  Subjective Chief Complaint: Jacqueline Hughes presents for a preoperative encounter.   History of Present Illness: Jacqueline Hughes is a 56 y.o. female who presents for preoperative visit.  She is scheduled to undergo Exam under anesthesia, midurethral sling, cystoscopy on 10/03/22.  Her symptoms include stress urinary incontinence.   Previously, s/p urethral bulking on 08/15/22, and s/p Robotic assisted laparoscopic lysis of adhesions, bilateral salpingo-oophorectomy, sacrocolpopexy, cystoscopy on 05/10/22    Past Medical History:  Diagnosis Date   ADHD (attention deficit hyperactivity disorder)    Arthritis    Depression    Family history of adverse reaction to anesthesia    mother--- ponv   GAD (generalized anxiety disorder)    History of abnormal cervical Pap smear    prior to hysterectomy, pap smears normal after hysterectomy   History of asthma    History of Clostridium difficile colitis 10/19/2021   take augmentin   History of hypothyroidism    Hyperlipidemia    OAB (overactive bladder)    Osteopenia    PONV (postoperative nausea and vomiting)    Urethral caruncle    Vitamin D deficiency    Wears glasses      Past Surgical History:  Procedure Laterality Date   ABDOMINAL HYSTERECTOMY  2003   bleeding issues, in [place] Bilateral    [place]     2008 [facility] [place] [facility]   CYSTOSCOPY N/A 05/10/2022   Procedure: CYSTOSCOPY;  Surgeon: [name], MD;  Location: [facility];  Service: Gynecology;  Laterality: N/A;   LAPAROTOMY     LEEP     ROBOTIC ASSISTED LAPAROSCOPIC SACROCOLPOPEXY N/A 05/10/2022   Procedure: XI ROBOTIC ASSISTED LAPAROSCOPIC SACROCOLPOPEXY;  Surgeon: [name], MD;  Location: [facility];  Service: Gynecology;  Laterality: N/A;   ROBOTIC ASSISTED SALPINGO OOPHERECTOMY Bilateral 05/10/2022    Procedure: XI ROBOTIC ASSISTED SALPINGO OOPHORECTOMY AND LYSIS OF ADHESIONS;  Surgeon: [name], MD;  Location: [facility];  Service: Gynecology;  Laterality: Bilateral;  Total time requested is 3.5 hours    is allergic to e-mycin [erythromycin], lexapro [escitalopram oxalate], augmentin [amoxicillin-pot clavulanate], lactose intolerance (gi), covid-19 mrna vaccine (pfizer) [covid-19 mrna vacc (moderna)], and gadolinium.   Family History  Problem Relation Age of Onset   Thyroid disease Mother    Cancer Mother    Osteopenia Mother    Uterine cancer Maternal Grandmother    Breast cancer Maternal Grandmother    Alcoholism Other        granparents   Depression Other        aunts   Colon cancer Neg Hx    Esophageal cancer Neg Hx     Social History   Tobacco Use   Smoking status: Former    Types: Cigarettes    Quit date: 08/25/2004    Years since quitting: 18.1    Passive exposure: Never   Smokeless tobacco: Never  Vaping Use   Vaping Use: Never used  Substance Use Topics   Alcohol use: Yes    Comment: occasionally   Drug use: No     Review of Systems was negative for a full 10 system review except as noted in the History of Present Illness.   Current Facility-Administered Medications:    ceFAZolin (ANCEF) IVPB 2g/100 mL premix, 2 g, Intravenous, On Call to OR, [name], MD   lactated ringers infusion, ,  Intravenous, Continuous, Lidia Collum, MD, Last Rate: 10 mL/hr at 10/03/22 1133, New Bag at 10/03/22 1133   povidone-iodine 10 % swab 2 Application, 2 Application, Topical, Once, [name], MD   scopolamine (TRANSDERM-SCOP) 1 MG/3DAYS 1.5 mg, 1 patch, Transdermal, Q72H, Suzette Battiest, MD, 1.5 mg at 10/03/22 1205   Objective Vitals:   10/03/22 1130  BP: 133/88  Pulse: 71  Resp: 16  Temp: 97.8 F (36.6 C)  SpO2: 99%    Gen: NAD CV: S1 S2 RRR Lungs: Clear to auscultation bilaterally Abd: soft,  nontender   Assessment/ Plan  The patient is a 56 y.o. year old with stress urinary incontinence scheduled to undergo midurethral sling, cystoscopy.  Consent was obtained for these procedures.   [name], MD

## 2022-10-03 NOTE — Discharge Instructions (Addendum)
POST OPERATIVE INSTRUCTIONS  General Instructions Recovery (not bed rest) will last approximately 6 weeks Walking is encouraged, but refrain from strenuous exercise/ housework/ heavy lifting. No lifting >10lbs  Nothing in the vagina- NO intercourse, tampons or douching Bathing:  Do not submerge in water (NO swimming, bath, hot tub, etc) until after your postop visit. You can shower starting the day after surgery.  No driving until you are not taking narcotic pain medicine and until your pain is well enough controlled that you can slam on the breaks or make sudden movements if needed.   Taking your medications Please take your acetaminophen and ibuprofen on a schedule for the first 48 hours. Take 633m ibuprofen, then take 5010macetaminophen 3 hours later, then continue to alternate ibuprofen and acetaminophen. That way you are taking each type of medication every 6 hours. Take the prescribed narcotic (oxycodone, tramadol, etc) as needed, with a maximum being every 4 hours.  Take a stool softener daily to keep your stools soft and preventing you from straining. If you have diarrhea, you decrease your stool softener. This is explained more below. We have prescribed you Miralax.  Reasons to Call the Nurse (see last page for phone numbers) Heavy Bleeding (changing your pad every 1-2 hours) Persistent nausea/vomiting Fever (100.4 degrees or more) Incision problems (pus or other fluid coming out, redness, warmth, increased pain)  Things to Expect After Surgery Mild to Moderate pain is normal during the first day or two after surgery. If prescribed, take Ibuprofen or Tylenol first and use the stronger medicine for "break-through" pain. You can overlap these medicines because they work differently.   Constipation   To Prevent Constipation:  Eat a well-balanced diet including protein, grains, fresh fruit and vegetables.  Drink plenty of fluids. Walk regularly.  Depending on specific instructions  from your physician: take Miralax daily and additionally you can add a stool softener (colace/ docusate) and fiber supplement. Continue as long as you're on pain medications.   To Treat Constipation:  If you do not have a bowel movement in 2 days after surgery, you can take 2 Tbs of Milk of Magnesia 1-2 times a day until you have a bowel movement. If diarrhea occurs, decrease the amount or stop the laxative. If no results with Milk of Magnesia, you can drink a bottle of magnesium citrate which you can purchase over the counter.  Fatigue:  This is a normal response to surgery and will improve with time.  Plan frequent rest periods throughout the day.  Gas Pain:  This is very common but can also be very painful! Drink warm liquids such as herbal teas, bouillon or soup. Walking will help you pass more gas.  Mylicon or Gas-X can be taken over the counter.  Leaking Urine:  Varying amounts of leakage may occur after surgery.  This should improve with time. Your bladder needs at least 3 months to recover from surgery. If you leak after surgery, be sure to mention this to your doctor at your post-op visit. If you were taking medications for overactive bladder prior to surgery, be sure to restart the medications immediately after surgery.  Incisions: If you have incisions on your abdomen, the skin glue will dissolve on its own over time. It is ok to gently rinse with soap and water over these incisions but do not scrub.  Catheter Approximately 50% of patients are unable to urinate after surgery and need to go home with a catheter. This allows your bladder to  rest so it can return to full function. If you go home with a catheter, the office will call to set up a voiding trial a few days after surgery. For most patients, by this visit, they are able to urinate on their own. Long term catheter use is rare.   Return to Work  As work demands and recovery times vary widely, it is hard to predict when you will want  to return to work. If you have a desk job with no strenuous physical activity, and if you would like to return sooner than generally recommended, discuss this with your provider or call our office.   Post op concerns  For non-emergent issues, please call the Urogynecology Nurse. Please leave a message and someone will contact you within one business day.  You can also send a message through [place].   AFTER HOURS (After 5:00 PM and on weekends):  For urgent matters that cannot wait until the next business day. Call our office [phone] and connect to the doctor on call.  Please reserve this for important issues.   **FOR ANY TRUE EMERGENCY ISSUES CALL 911 OR GO TO THE NEAREST EMERGENCY ROOM.** Please inform our office or the doctor on call of any emergency.     APPOINTMENTS: Call [phone]    NO TYLENOL PRODUCTS UNTIL 5:21PM NO IBUPROFEN PRODUCTS UNTIL 8:24PM      Post Anesthesia Home Care Instructions  Activity: Get plenty of rest for the remainder of the day. A responsible individual must stay with you for 24 hours following the procedure.  For the next 24 hours, DO NOT: -Drive a car -Paediatric nurse -Drink alcoholic beverages -Take any medication unless instructed by your physician -Make any legal decisions or sign important papers.  Meals: Start with liquid foods such as gelatin or soup. Progress to regular foods as tolerated. Avoid greasy, spicy, heavy foods. If nausea and/or vomiting occur, drink only clear liquids until the nausea and/or vomiting subsides. Call your physician if vomiting continues.  Special Instructions/Symptoms: Your throat may feel dry or sore from the anesthesia or the breathing tube placed in your throat during surgery. If this causes discomfort, gargle with warm salt water. The discomfort should disappear within 24 hours.  If you had a scopolamine patch placed behind your ear for the management of post- operative nausea and/or  vomiting:  1. The medication in the patch is effective for 72 hours, after which it should be removed.  Wrap patch in a tissue and discard in the trash. Wash hands thoroughly with soap and water. 2. You may remove the patch earlier than 72 hours if you experience unpleasant side effects which may include dry mouth, dizziness or visual disturbances. 3. Avoid touching the patch. Wash your hands with soap and water after contact with the patch.

## 2022-10-03 NOTE — Anesthesia Procedure Notes (Signed)
Procedure Name: LMA Insertion Date/Time: 10/03/2022 1:50 PM  Performed by: Georgeanne Nim, CRNAPre-anesthesia Checklist: Patient identified, Emergency Drugs available, Suction available, Patient being monitored and Timeout performed Patient Re-evaluated:Patient Re-evaluated prior to induction Oxygen Delivery Method: Circle system utilized Preoxygenation: Pre-oxygenation with 100% oxygen Induction Type: IV induction Ventilation: Mask ventilation without difficulty LMA: LMA inserted LMA Size: 4.0 Number of attempts: 1 Placement Confirmation: positive ETCO2, CO2 detector and breath sounds checked- equal and bilateral Tube secured with: Tape Dental Injury: Teeth and Oropharynx as per pre-operative assessment

## 2022-10-03 NOTE — Op Note (Signed)
Operative Note  Preoperative Diagnosis: stress urinary incontinence  Postoperative Diagnosis: same  Procedures performed:  Midurethral sling (Advantage Fit), cystoscopy  Implants:  Implant Name Type Inv. Item Serial No. Manufacturer Lot No. LRB No. Used Action  SYS SLING ADV FIT BLUE TRNSVAG - [id number] Sling SYS SLING ADV FIT [place] [id number] N/A 1 Implanted    Attending Surgeon: Sherlene Shams, MD  Anesthesia: General LMA  Findings: Normal bladder and urethra without injury, lesion or foreign body. Brisk bilateral ureteral efflux noted.    Specimens: n/a  Estimated blood loss: 30 mL  IV fluids: 800 mL  Urine output: 50 mL  Complications: none  Procedure in Detail: After informed consent was obtained, the patient was taken to the operating room where she was placed under anesthesia.  She was then placed in the dorsal lithotomy position with allen stirrups,  and prepped and draped in the usual sterile fashion.  A strap was placed across her upper abdomen on top of her gown so it was not in direct contact with her skin.  Care was taken to avoid hyperflexion or hyperextension of her upper and lower extremities. A foley catheter was placed.  A lonestar self-retraining retractor was placed with 4 stay hooks. The mid urethral area was located on the anterior vaginal wall.  Two Allis clamps were placed at the level of the midurethra. 1% lidocaine with epinephrine was injected into the vaginal mucosa. A vertical incision was made between the two clamps using a 15-blade scalpel.  Using sharp dissection, Metzenbaum scissors were used to make a periurethral tunnel from the vaginal incision towards the pubic rami bilaterally for the future sling tracts. The bladder was ensured to be empty. The trocar and attached sling were introduced into the right side of the periurethral vaginal incision, just inferior to the pubic symphysis on the right side. The trocar was guided  through the endopelvic fascia and directly vertically.  While hugging the cephalad surface of the pubic bone, the trocar was guided out through the abdomen 2 fingerbreadths lateral to midline at the level of the pubic symphysis on the ipsilateral side. The trocar was placed on the left side in a similar fashion.  A 70-degree cystoscope was introduced, and 360-degree inspection revealed no trauma or trocars in the bladder, with brisk bilateral ureteral efflux.  The bladder was drained and the cystoscope was removed.  The Foley catheter was reinserted.  The sling was brought to lie beneath the mid-urethra.  A needle driver was placed behind the sling to ensure no tension.   The plastic sheath was removed from the sling and the distal ends of the sling were trimmed just below the level of the skin incisions.  Tension-free positioning of the sling was confirmed. Vaginal inspection revealed no vaginotomy or sling perforations of the mucosa.  The vaginal mucosal edges were reapproximated using 2-0 Vicryl.  The vagina was copiously irrigated.  Hemostasis was again noted. Vaginal packing not placed. The suprapubic sling incisions were closed with Dermabond. The patient tolerated the procedure well.  She was awakened from anesthesia and transferred to the recovery room in stable condition. All needle and sponge counts were correct x 2.    [name], MD

## 2022-10-03 NOTE — Transfer of Care (Signed)
Immediate Anesthesia Transfer of Care Note  Patient: Jacqueline Hughes  Procedure(s) Performed: TRANSVAGINAL TAPE (TVT) PROCEDURE (Vagina ) CYSTOSCOPY (Urethra)  Patient Location: PACU  Anesthesia Type:General  Level of Consciousness: drowsy  Airway & Oxygen Therapy: Patient Spontanous Breathing and Patient connected to nasal cannula oxygen  Post-op Assessment: Report given to RN and Post -op Vital signs reviewed and stable  Post vital signs: Reviewed and stable  Last Vitals:  Vitals Value Taken Time  BP 106/84 10/03/22 1445  Temp    Pulse 88 10/03/22 1445  Resp 11 10/03/22 1445  SpO2 100 % 10/03/22 1445  Vitals shown include unvalidated device data.  Last Pain:  Vitals:   10/03/22 1130  TempSrc: Oral  PainSc: 2       Patients Stated Pain Goal: 4 (83/16/74 2552)  Complications: No notable events documented.

## 2022-10-03 NOTE — Anesthesia Preprocedure Evaluation (Addendum)
Anesthesia Evaluation  Patient identified by MRN, date of birth, ID band Patient awake    Reviewed: Allergy & Precautions, NPO status , Patient's Chart, lab work & pertinent test results  History of Anesthesia Complications (+) PONV and history of anesthetic complications  Airway Mallampati: II  TM Distance: >3 FB Neck ROM: Full    Dental  (+) Dental Advisory Given   Pulmonary former smoker   breath sounds clear to auscultation       Cardiovascular negative cardio ROS  Rhythm:Regular Rate:Normal     Neuro/Psych negative neurological ROS     GI/Hepatic negative GI ROS, Neg liver ROS,,,  Endo/Other  negative endocrine ROS    Renal/GU negative Renal ROS     Musculoskeletal  (+) Arthritis ,    Abdominal   Peds  Hematology negative hematology ROS (+)   Anesthesia Other Findings   Reproductive/Obstetrics                             Anesthesia Physical Anesthesia Plan  ASA: 2  Anesthesia Plan: General   Post-op Pain Management: Tylenol PO (pre-op)* and Toradol IV (intra-op)*   Induction: Intravenous  PONV Risk Score and Plan: 4 or greater and Scopolamine patch - Pre-op, Midazolam, Dexamethasone, Ondansetron, Treatment may vary due to age or medical condition, Propofol infusion and TIVA  Airway Management Planned: Oral ETT  Additional Equipment:   Intra-op Plan:   Post-operative Plan: Extubation in OR  Informed Consent: I have reviewed the patients History and Physical, chart, labs and discussed the procedure including the risks, benefits and alternatives for the proposed anesthesia with the patient or authorized representative who has indicated his/her understanding and acceptance.     Dental advisory given  Plan Discussed with:   Anesthesia Plan Comments: (Plan TIVA.)       Anesthesia Quick Evaluation

## 2022-10-04 ENCOUNTER — Encounter (HOSPITAL_BASED_OUTPATIENT_CLINIC_OR_DEPARTMENT_OTHER): Payer: Self-pay | Admitting: Obstetrics and Gynecology

## 2022-10-04 NOTE — Anesthesia Postprocedure Evaluation (Signed)
Anesthesia Post Note  Patient: Jacqueline Hughes  Procedure(s) Performed: TRANSVAGINAL TAPE (TVT) PROCEDURE (Vagina ) CYSTOSCOPY (Urethra)     Patient location during evaluation: PACU Anesthesia Type: General Level of consciousness: awake and alert Pain management: pain level controlled Vital Signs Assessment: post-procedure vital signs reviewed and stable Respiratory status: spontaneous breathing, nonlabored ventilation, respiratory function stable and patient connected to nasal cannula oxygen Cardiovascular status: blood pressure returned to baseline and stable Postop Assessment: no apparent nausea or vomiting Anesthetic complications: no   No notable events documented.  Last Vitals:  Vitals:   10/03/22 1515 10/03/22 1550  BP: 119/83 130/87  Pulse: 66 63  Resp: 13   Temp:  (!) 36.4 C  SpO2: 94% 100%    Last Pain:  Vitals:   10/04/22 1038  TempSrc:   PainSc: 4    Pain Goal: Patients Stated Pain Goal: 4 (10/03/22 1130)                 Tiajuana Amass

## 2022-10-05 ENCOUNTER — Telehealth: Payer: Self-pay | Admitting: Obstetrics and Gynecology

## 2022-10-05 NOTE — Telephone Encounter (Signed)
Jacqueline Hughes underwent midurethral sling, cystoscopy on 10/03/22.   She passed her voiding trial.  392m was backfilled into the bladder Voided 3052m PVR by bladder scan was 52m74m  She was discharged without a catheter. Please call her for a routine post op check. Thanks!  MicJaquita FoldsD

## 2022-10-10 ENCOUNTER — Encounter: Payer: Self-pay | Admitting: Obstetrics and Gynecology

## 2022-10-10 NOTE — Telephone Encounter (Signed)
Called patient for post op check.  She reports she had some gas pain yesterday that she reports was very painful. She reports she stayed in bed x24 hours. States she has had a BM and is taking the Miralax for her bowels. Her husband got her some Gasx and she reports today she feels she is back on track.  Patient reports she was able to urinate without difficulty.  Patient reports she went back to work today. Taking tylenol and ibuprofen, had no more oxycodone to take.  Overall doing okay and reports she will call if she has any concerning signs or symptoms.

## 2022-10-11 ENCOUNTER — Encounter: Payer: Self-pay | Admitting: Obstetrics and Gynecology

## 2022-10-15 ENCOUNTER — Encounter: Payer: Self-pay | Admitting: Obstetrics and Gynecology

## 2022-10-15 ENCOUNTER — Telehealth: Payer: Self-pay | Admitting: Obstetrics and Gynecology

## 2022-10-15 NOTE — Telephone Encounter (Signed)
On call message received. Returned call to patient and confirmed IDx2. Patient states walking around more today and recent return to work following mid-urethral sling procedure. Patient states she went to the bathroom and noted bleeding on incontinence pad and a few small blood clots from the vagina. No pain but feeling pressure. Chills yesterday without fever, notably experiencing URI symptoms including worsening cough. Not currently in cough suppressant. Most recent bleeding ~47mn ago, not soaking through pads. Has vaginal estrogen at home not currently using.  Recommend monitor bleeding, cough suppressant to reduce abdominal-pelvic pressure, restricted lifting and follow up outpatient with Dr SWannetta Senderto evaluate mesh and sutures and possible administration of vaginal estrogen.  Reviewed precautions to prompt immediate evaluation in ED. Patient verbalized understanding.

## 2022-10-15 NOTE — Progress Notes (Deleted)
On call message received. Returned call to patient and confirmed IDx2. Patient states walking around more today and recent return to work following mid-urethral sling procedure. Patient states she went to the bathroom and noted bleeding on incontinence pad and a few small blood clots from the vagina. No pain but feeling pressure. Chills yesterday without fever, notably experiencing URI symptoms including worsening cough. Not currently in cough suppressant. Most recent bleeding ~37mn ago, not soaking through pads. Has vaginal estrogen at home not currently using.  Recommend monitor bleeding, cough suppressant to reduce abdominal-pelvic pressure, restricted lifting and follow up outpatient with Dr SWannetta Senderto evaluate mesh and sutures and possible administration of vaginal estrogen.  Reviewed precautions to prompt immediate evaluation in ED. Patient verbalized understanding.

## 2022-10-17 ENCOUNTER — Encounter: Payer: Self-pay | Admitting: Obstetrics and Gynecology

## 2022-10-17 ENCOUNTER — Telehealth: Payer: Self-pay | Admitting: Obstetrics and Gynecology

## 2022-10-17 DIAGNOSIS — N939 Abnormal uterine and vaginal bleeding, unspecified: Secondary | ICD-10-CM

## 2022-10-17 NOTE — Telephone Encounter (Signed)
Called and spoke to patient about her bleeding. Plan for her to come tomorrow as she is at work today and cannot come to the office. She reports a small amount of bleeding this morning. Will assess this tomorrow and check for a UTI due to reported urinary leakage. Patient aware of plan. Will see her tomorrow at 10:40.

## 2022-10-18 ENCOUNTER — Encounter: Payer: BC Managed Care – PPO | Admitting: Obstetrics and Gynecology

## 2022-10-18 ENCOUNTER — Encounter: Payer: Self-pay | Admitting: Obstetrics and Gynecology

## 2022-10-18 ENCOUNTER — Ambulatory Visit (INDEPENDENT_AMBULATORY_CARE_PROVIDER_SITE_OTHER): Payer: BC Managed Care – PPO | Admitting: Obstetrics and Gynecology

## 2022-10-18 VITALS — BP 129/94 | HR 83

## 2022-10-18 DIAGNOSIS — R350 Frequency of micturition: Secondary | ICD-10-CM | POA: Diagnosis not present

## 2022-10-18 DIAGNOSIS — N3281 Overactive bladder: Secondary | ICD-10-CM | POA: Diagnosis not present

## 2022-10-18 DIAGNOSIS — Z9889 Other specified postprocedural states: Secondary | ICD-10-CM

## 2022-10-18 LAB — POCT URINALYSIS DIPSTICK
Bilirubin, UA: NEGATIVE
Blood, UA: NEGATIVE
Glucose, UA: NEGATIVE
Ketones, UA: NEGATIVE
Nitrite, UA: NEGATIVE
Protein, UA: NEGATIVE
Spec Grav, UA: 1.015 (ref 1.010–1.025)
Urobilinogen, UA: NEGATIVE E.U./dL — AB
pH, UA: 7 (ref 5.0–8.0)

## 2022-10-18 MED ORDER — SOLIFENACIN SUCCINATE 10 MG PO TABS: 10.0000 mg | ORAL_TABLET | Freq: Every day | ORAL | 5 refills | Status: DC

## 2022-10-18 MED ORDER — LIDOCAINE-EPINEPHRINE 1 %-1:100000 IJ SOLN
3.0000 mL | Freq: Once | INTRAMUSCULAR | Status: AC
  Administered 2022-10-18: 3 mL

## 2022-10-18 NOTE — Addendum Note (Signed)
Addended by: Elita Quick on: 10/18/2022 10:40 AM   Modules accepted: Orders

## 2022-10-18 NOTE — Progress Notes (Signed)
[facility]  Date of Visit: 10/18/2022  History of Present Illness: Ms. Jacqueline Hughes is a 56 y.o. female scheduled today for a post-operative visit.   Surgery: s/p midurethral sling, cystoscopy on 10/03/22. Also s/p urethral bulking on 08/15/22, and s/p Robotic assisted laparoscopic lysis of adhesions, bilateral salpingo-oophorectomy, sacrocolpopexy, cystoscopy on 05/10/22    She passed her postoperative void trial.   Over the weekend, had been walking around shopping but taking frequent breaks. She then noticed quarter sized clots of blood from the vagina. After resting it decreased to pea sized amounts. Contacted on call physician.   If she is sitting for longer periods, and then goes to stand up, has a large gush of urine that she cannot control. Not as often when she is home because she can go to the bathroom more often. Has been prescribed myrbetriq before but it was not covered by insurance so she never took it.   Medications: She has a current medication list which includes the following prescription(s): acetaminophen, alprazolam, amphetamine-dextroamphetamine, amphetamine-dextroamphetamine, cyclobenzaprine, dicyclomine, estradiol, eszopiclone, fluoxetine, ibuprofen, multivitamin women, oxycodone, probiotic product, solifenacin, valacyclovir, and vitamin d (ergocalciferol).   Allergies: Patient is allergic to e-mycin [erythromycin], lexapro [escitalopram oxalate], augmentin [amoxicillin-pot clavulanate], lactose intolerance (gi), covid-19 mrna vaccine (pfizer) [covid-19 mrna vacc (moderna)], and gadolinium.   Physical Exam: BP (!) 129/94   Pulse 83   PVR: Urethra was prepped with betadine and straight catheter placed. A PVR of 52m was obtained.   Pelvic Examination: Vagina: suburethral incision present with sutures. Small gap present oozing tiny amount red blood. Area was prepped with betadine and injected with 1% lidocaine with epinephrine. Incision line was reinforced with 3  figure of eight sutures of 2-0 vicryl. Good hemostasis noted.   POC urine: small blood present, otherwise negative  Assessment and Plan:  1. Overactive bladder   2. Post-operative state    - For OAB symptoms, prescribed vesicare 169mdaily. Discussed side effects of dry mouth, dry eyes and constipation.  - Suture line reinforced today. Advised that she may see some light spotting after the procedure, but to notify use if she has larger amount of blood like before.  - Restart estrogen in 2 weeks but use only finger to apply.  Return for post op visit on 1/16  MiJaquita FoldsMD

## 2022-10-21 ENCOUNTER — Encounter: Payer: Self-pay | Admitting: Obstetrics and Gynecology

## 2022-10-21 ENCOUNTER — Ambulatory Visit (INDEPENDENT_AMBULATORY_CARE_PROVIDER_SITE_OTHER): Payer: BC Managed Care – PPO | Admitting: Obstetrics and Gynecology

## 2022-10-21 VITALS — BP 131/91 | HR 82

## 2022-10-21 DIAGNOSIS — Z9889 Other specified postprocedural states: Secondary | ICD-10-CM

## 2022-10-21 MED ORDER — CEPHALEXIN 500 MG PO CAPS: 500.0000 mg | ORAL_CAPSULE | Freq: Two times a day (BID) | ORAL | 0 refills | Status: DC

## 2022-10-21 NOTE — Progress Notes (Signed)
[facility]  Date of Visit: 10/21/2022  History of Present Illness: Ms. January is a 56 y.o. female scheduled today for a post-operative visit.   Surgery: s/p midurethral sling, cystoscopy on 10/03/22. Also s/p urethral bulking on 08/15/22, and s/p Robotic assisted laparoscopic lysis of adhesions, bilateral salpingo-oophorectomy, sacrocolpopexy, cystoscopy on 05/10/22    She passed her postoperative void trial.   Yesterday pt called on call number due to vaginal bleeding. She had only been walking around the house. She had a nap and noted bright red blood on her pad. At that point it slowed down. She has some light bleeding now, more brown in color. Had sutures at incision line reinforced on Tues 12/19.  Medications: She has a current medication list which includes the following prescription(s): cephalexin, acetaminophen, alprazolam, amphetamine-dextroamphetamine, amphetamine-dextroamphetamine, cyclobenzaprine, dicyclomine, estradiol, eszopiclone, fluoxetine, ibuprofen, multivitamin women, oxycodone, probiotic product, solifenacin, valacyclovir, and vitamin d (ergocalciferol).   Allergies: Patient is allergic to e-mycin [erythromycin], lexapro [escitalopram oxalate], augmentin [amoxicillin-pot clavulanate], lactose intolerance (gi), covid-19 mrna vaccine (pfizer) [covid-19 mrna vacc (moderna)], and gadolinium.   Physical Exam: BP (!) 131/91   Pulse 82     Pelvic Examination: Vagina: suburethral incision is gaping with loose sutures. No active bleeding present. Area was prepped with betadine and injected with 1% lidocaine with epinephrine. Old sutures were removed. Incision line was closed with 5 figure of eight sutures of 3-0 vicryl. Incision line was intact. Good hemostasis noted.    Assessment and Plan:  1. Post-operative state     - Suture line closed again today.  - We discussed limiting walking for a few days but should be able to resume normal activity as far as walking.  Just need to limit exercise and lifting.  - Unclear what is causing suture line to break down, possible infection present, although no obvious signs. Will treat with keflex 500 BID x 1 day. She does not want to be on a longer course of antibiotics due to history of c.diff.  - Can expect a small amount of bleeding. If she has large clots or changing pad frequently (>1/hr), then she should call the on call physician.  - Restart estrogen in 2 weeks but use only finger to apply.  Return for post op visit on 1/16 or sooner if needed  [name], MD

## 2022-10-26 ENCOUNTER — Encounter: Payer: Self-pay | Admitting: Obstetrics and Gynecology

## 2022-11-15 ENCOUNTER — Encounter: Payer: BC Managed Care – PPO | Admitting: Obstetrics and Gynecology

## 2022-11-30 ENCOUNTER — Other Ambulatory Visit: Payer: Self-pay | Admitting: Obstetrics and Gynecology

## 2022-11-30 ENCOUNTER — Encounter: Payer: Self-pay | Admitting: Obstetrics and Gynecology

## 2022-11-30 ENCOUNTER — Ambulatory Visit (INDEPENDENT_AMBULATORY_CARE_PROVIDER_SITE_OTHER): Payer: BC Managed Care – PPO | Admitting: Obstetrics and Gynecology

## 2022-11-30 VITALS — BP 111/59 | HR 79

## 2022-11-30 DIAGNOSIS — N3281 Overactive bladder: Secondary | ICD-10-CM

## 2022-11-30 DIAGNOSIS — Z9889 Other specified postprocedural states: Secondary | ICD-10-CM

## 2022-11-30 MED ORDER — MIRABEGRON ER 25 MG PO TB24: 25.0000 mg | ORAL_TABLET | Freq: Every day | ORAL | 5 refills | Status: DC

## 2022-11-30 NOTE — Progress Notes (Signed)
[facility]  Date of Visit: 11/30/2022  History of Present Illness: Ms. Jacqueline Hughes is a 57 y.o. female scheduled today for a post-operative visit.   Surgery: s/p s/p midurethral sling, cystoscopy on 10/03/22. Also s/p urethral bulking on 08/15/22, and s/p Robotic assisted laparoscopic lysis of adhesions, bilateral salpingo-oophorectomy, sacrocolpopexy, cystoscopy on 05/10/22    She passed her postoperative void trial.   Postoperative course has been complicated by suture dehiscence and bleeding. Incision repair under local in the office.   Today she reports she is still wearing a pad. Still has some stress leakage with more vigorous movements. Once she gets to the bathroom, she has a hard time holding the urine. Not every time she urinates. She is not taking the vesicare. Urinating more than 10 times per day and waking at night several times at night (which is consistent with her initial baseline prior to any surgery). Drinks mostly water.    Medications: She has a current medication list which includes the following prescription(s): acetaminophen, alprazolam, amphetamine-dextroamphetamine, amphetamine-dextroamphetamine, cephalexin, cyclobenzaprine, dicyclomine, estradiol, eszopiclone, fluoxetine, ibuprofen, mirabegron er, multivitamin women, probiotic product, solifenacin, valacyclovir, and vitamin d (ergocalciferol).   Allergies: Patient is allergic to e-mycin [erythromycin], lexapro [escitalopram oxalate], augmentin [amoxicillin-pot clavulanate], lactose intolerance (gi), covid-19 mrna vaccine (pfizer) [covid-19 mrna vacc (moderna)], and gadolinium.   Physical Exam: BP (!) 111/59   Pulse 79    Suprapubic Incisions: healing well.  Pelvic Examination: Vagina: Incisions healing well. A few sutures present in suburethral area but well healed and intact.  No visible or palpable mesh.  POP-Q: Deferred, no prolapse  noted  ---------------------------------------------------------  Assessment and Plan:  1. Post-operative state   2. Overactive bladder     - Can resume regular activity. Wait an additional two weeks for intercourse.  - For OAB symptoms, will start Myrbetriq '25mg'$  daily. Samples provided. Also reviewed third line therapies and she is potentially interested in botox if medication does not work. Handout provided.   Return 6 weeks for follow up

## 2022-12-01 ENCOUNTER — Encounter: Payer: Self-pay | Admitting: Obstetrics and Gynecology

## 2022-12-05 ENCOUNTER — Ambulatory Visit (INDEPENDENT_AMBULATORY_CARE_PROVIDER_SITE_OTHER): Payer: BC Managed Care – PPO

## 2022-12-05 ENCOUNTER — Other Ambulatory Visit: Payer: Self-pay | Admitting: Obstetrics and Gynecology

## 2022-12-05 DIAGNOSIS — R82998 Other abnormal findings in urine: Secondary | ICD-10-CM

## 2022-12-05 DIAGNOSIS — R319 Hematuria, unspecified: Secondary | ICD-10-CM

## 2022-12-05 DIAGNOSIS — R350 Frequency of micturition: Secondary | ICD-10-CM

## 2022-12-05 LAB — POCT URINALYSIS DIPSTICK
Bilirubin, UA: NEGATIVE
Glucose, UA: NEGATIVE
Nitrite, UA: NEGATIVE
Protein, UA: NEGATIVE
Spec Grav, UA: <=1.005 — AB (ref 1.010–1.025)
Urobilinogen, UA: 0.2 E.U./dL
pH, UA: 6 (ref 5.0–8.0)

## 2022-12-05 MED ORDER — NITROFURANTOIN MONOHYD MACRO 100 MG PO CAPS: 100.0000 mg | ORAL_CAPSULE | Freq: Two times a day (BID) | ORAL | 0 refills | Status: DC

## 2022-12-05 NOTE — Progress Notes (Signed)
Jacqueline Hughes is a 57 y.o. female arrived today with UTI sx.  Per Dr. Tommas Olp protocol: A urine specimen was collected and POCT Urine was done and urine culture sent to the lab. POCT Urine was Positive for small leukocytes and moderate blood/  Pt was notified and prescription sent to the preferred pharmacy.

## 2022-12-05 NOTE — Patient Instructions (Signed)
Your Urine dip that was done in office was Positive. I am sending the urine off for culture and you can take AZO over the counter for your discomfort.  We have also ordered Macrobid for you to take while we wait for your culture results, hopefully this gives you some relief. We will contact you when the results are back between 3-5 days. If a different antibiotic is needed we will sent the order to the pharmacy and you will be notified. If you have any questions or concerns please feel free to call us at [phone]

## 2022-12-06 LAB — URINALYSIS, MICROSCOPIC ONLY
Bacteria, UA: NONE SEEN
Casts: NONE SEEN /lpf
Epithelial Cells (non renal): NONE SEEN /hpf (ref 0–10)

## 2022-12-07 LAB — URINE CULTURE: Organism ID, Bacteria: NO GROWTH

## 2022-12-19 ENCOUNTER — Encounter: Payer: Self-pay | Admitting: *Deleted

## 2022-12-19 ENCOUNTER — Ambulatory Visit (INDEPENDENT_AMBULATORY_CARE_PROVIDER_SITE_OTHER): Payer: BC Managed Care – PPO

## 2022-12-19 ENCOUNTER — Ambulatory Visit: Payer: BC Managed Care – PPO | Admitting: Sports Medicine

## 2022-12-19 DIAGNOSIS — M84375A Stress fracture, left foot, initial encounter for fracture: Secondary | ICD-10-CM | POA: Diagnosis not present

## 2022-12-19 DIAGNOSIS — M84376A Stress fracture, unspecified foot, initial encounter for fracture: Secondary | ICD-10-CM | POA: Insufficient documentation

## 2022-12-19 NOTE — Assessment & Plan Note (Signed)
This is a pleasant 57 year old female, she has had 8 months of pain left foot dorsal midfoot, on exam she has tenderness over the third and fourth metatarsal shafts. Suspect stress injury, we will going get foot x-rays but considering duration of treatment in spite of greater than 6 weeks of conservative treatment with an urgent care provider we will proceed with MRI. Adding a postop shoe, she will get a Morton's plate to place in her regular shoe. Return to see me in 4 weeks, she understands that this can take 8 weeks to get better.

## 2022-12-19 NOTE — Progress Notes (Signed)
    Procedures performed today:    None.  Independent interpretation of notes and tests performed by another provider:   None.  Brief History, Exam, Impression, and Recommendations:    Stress fracture of metatarsal bone This is a pleasant 57 year old female, she has had 8 months of pain left foot dorsal midfoot, on exam she has tenderness over the third and fourth metatarsal shafts. Suspect stress injury, we will going get foot x-rays but considering duration of treatment in spite of greater than 6 weeks of conservative treatment with an urgent care provider we will proceed with MRI. Adding a postop shoe, she will get a Morton's plate to place in her regular shoe. Return to see me in 4 weeks, she understands that this can take 8 weeks to get better.    ____________________________________________ Gwen Her. Dianah Field, M.D., ABFM., CAQSM., AME. Primary Care and Sports Medicine [facility]  Adjunct Professor of [employer]  Risk manager

## 2022-12-28 IMAGING — MG DIGITAL SCREENING BREAST BILAT IMPLANT W/ TOMO W/ CAD
8 of 12 series · 8 of 28 positions shown · non-contrast
Comparison: Previous exam(s).

CLINICAL DATA: Screening.

EXAM:
DIGITAL SCREENING BILATERAL MAMMOGRAM WITH IMPLANTS, CAD AND
TOMOSYNTHESIS
TECHNIQUE: Bilateral screening digital craniocaudal and mediolateral oblique
mammograms were obtained. Bilateral screening digital breast
tomosynthesis was performed. The images were evaluated with
computer-aided detection. Standard and/or implant displaced views
were performed.

[R MLO]
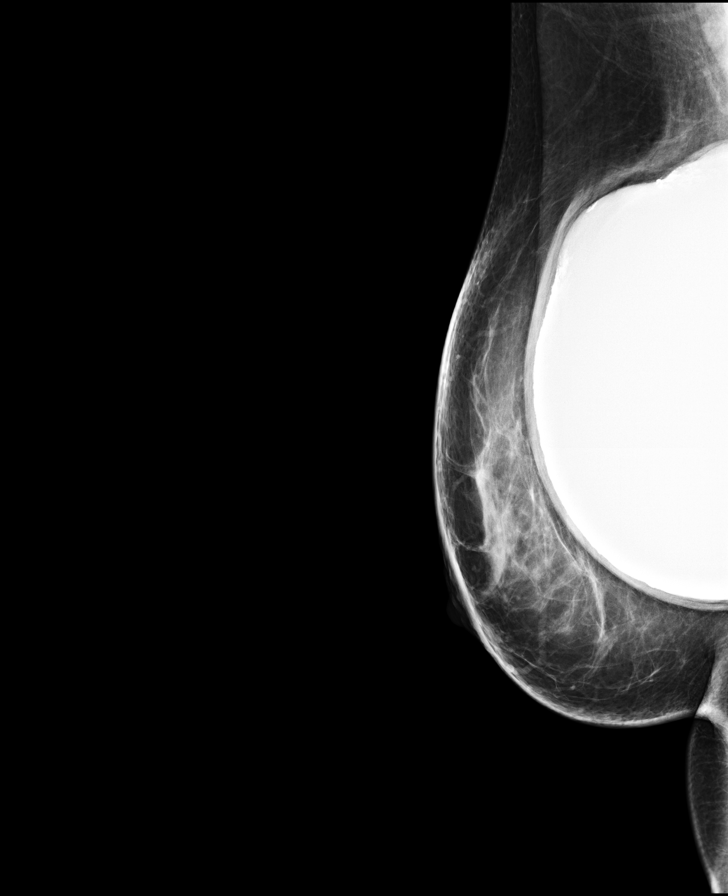

[R CC]
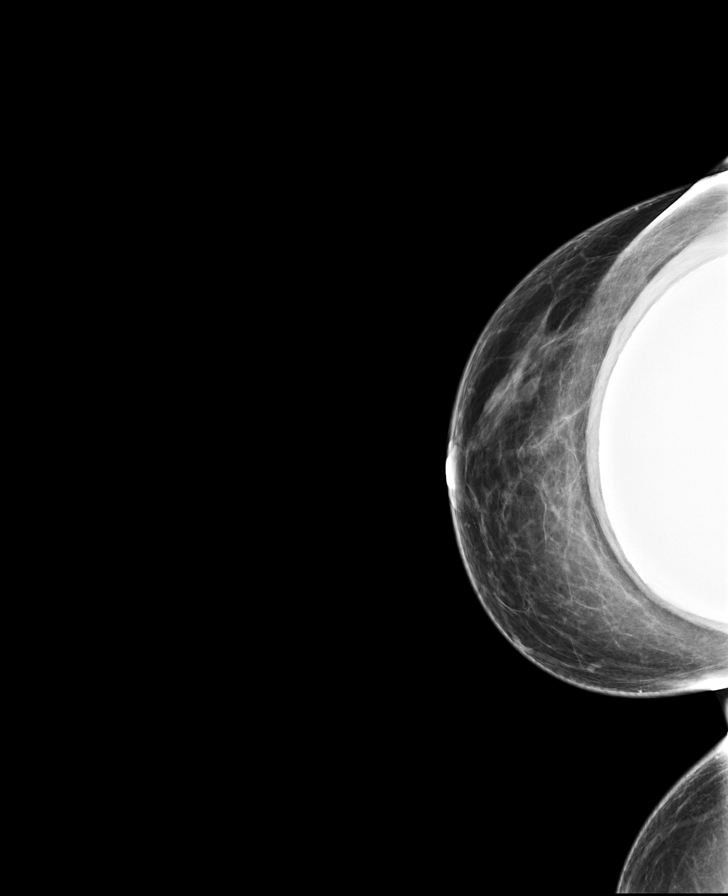

[L CC]
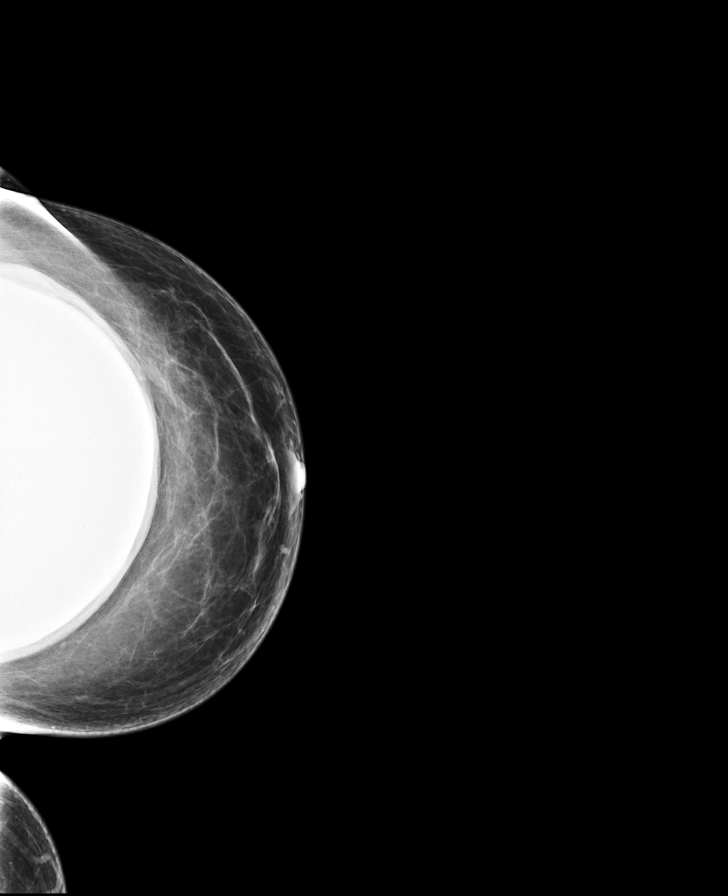

[L MLO]
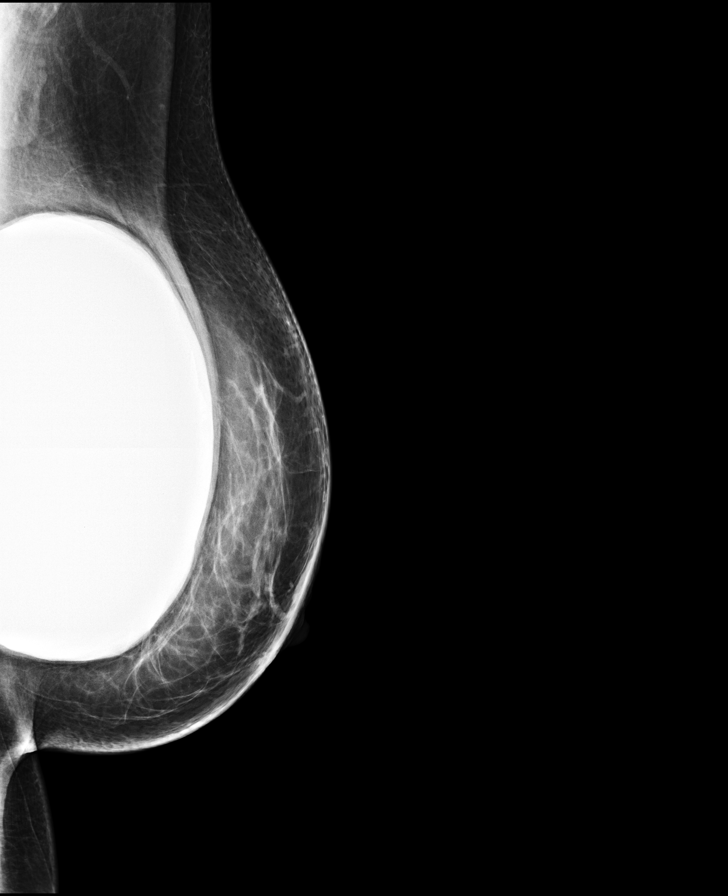

[L MLO synth-2D]
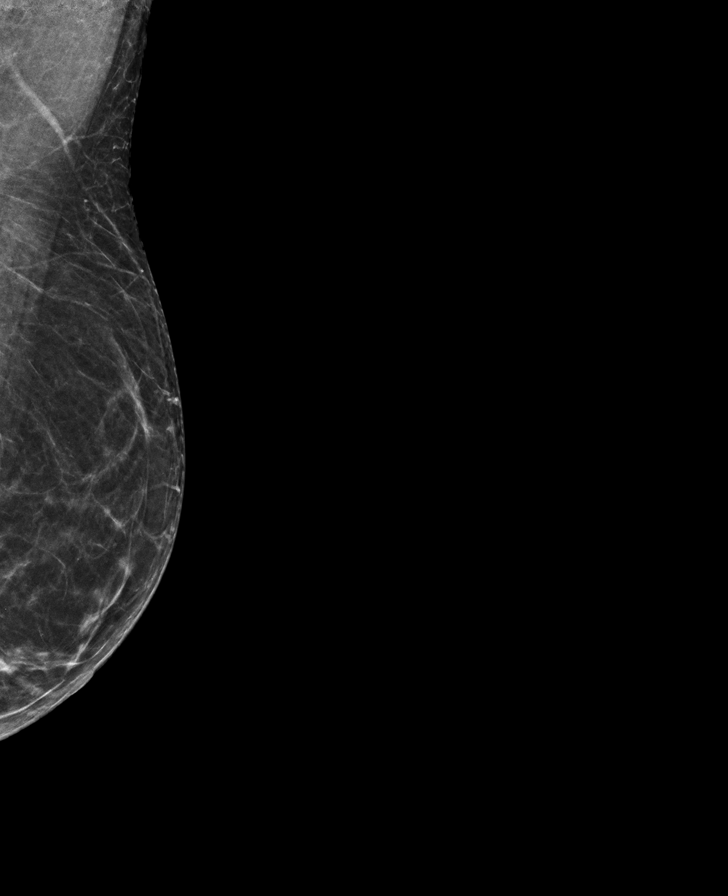

[R CC synth-2D]
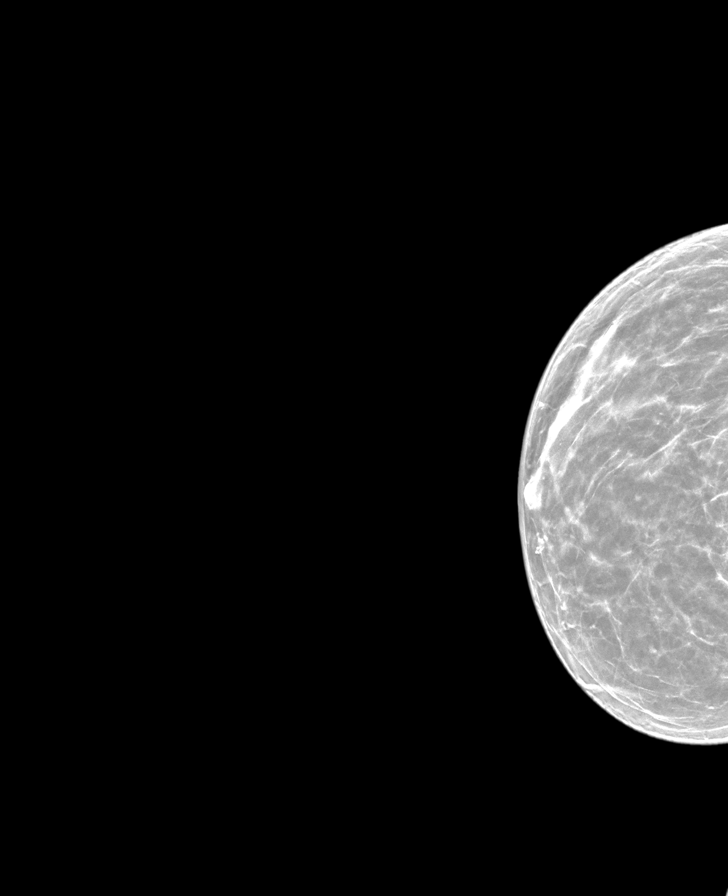

[L CC synth-2D]
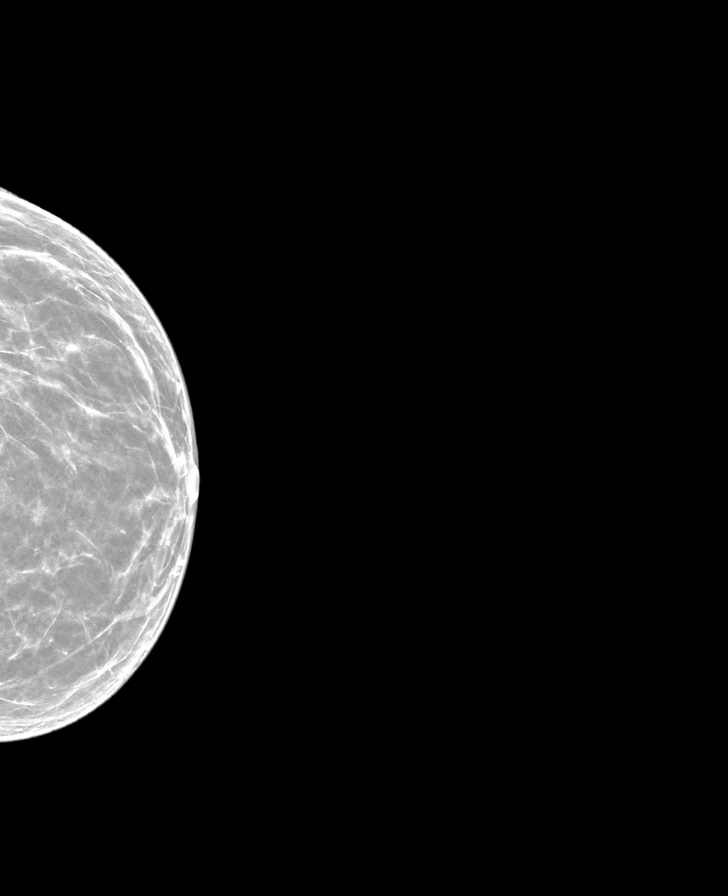

[R MLO synth-2D]
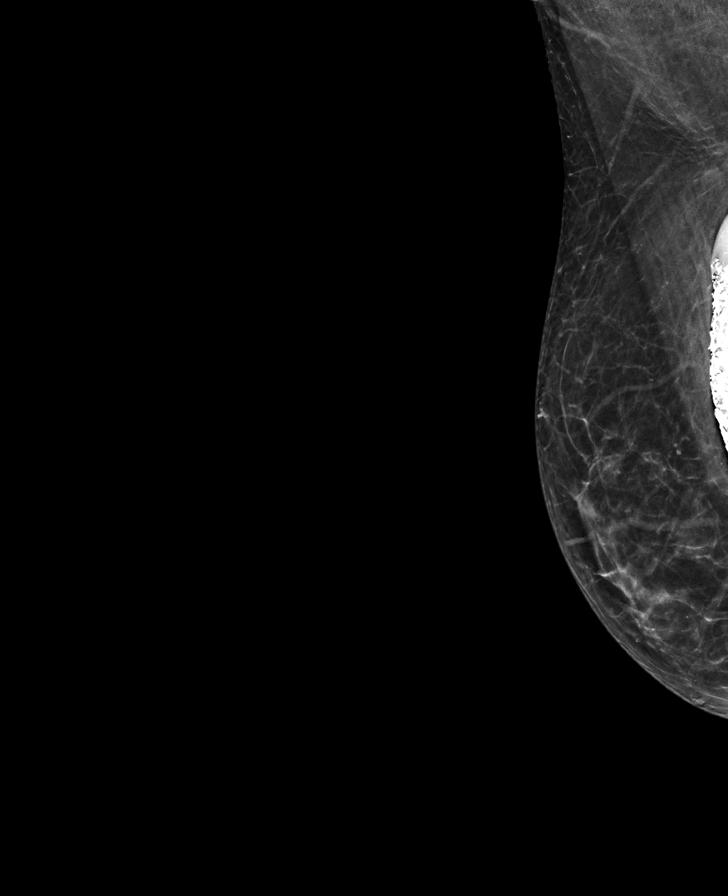

[8 of 28 positions shown; findings below may reference images not displayed]

ACR Breast Density Category b: There are scattered areas of
fibroglandular density.
FINDINGS: The patient has implants. There are no findings suspicious for
malignancy.
IMPRESSION: No mammographic evidence of malignancy. A result letter of this
screening mammogram will be mailed directly to the patient.

RECOMMENDATION:
Screening mammogram in one year. (Code:3J-[id number])

BI-RADS CATEGORY  1:  Negative.

## 2023-01-03 ENCOUNTER — Ambulatory Visit: Payer: BC Managed Care – PPO | Admitting: Physician Assistant

## 2023-01-03 ENCOUNTER — Encounter: Payer: Self-pay | Admitting: Physician Assistant

## 2023-01-03 VITALS — BP 118/82 | HR 98 | Ht 68.0 in | Wt 154.0 lb

## 2023-01-03 DIAGNOSIS — Z8619 Personal history of other infectious and parasitic diseases: Secondary | ICD-10-CM

## 2023-01-03 DIAGNOSIS — E559 Vitamin D deficiency, unspecified: Secondary | ICD-10-CM

## 2023-01-03 DIAGNOSIS — E079 Disorder of thyroid, unspecified: Secondary | ICD-10-CM

## 2023-01-03 DIAGNOSIS — Z0000 Encounter for general adult medical examination without abnormal findings: Secondary | ICD-10-CM

## 2023-01-03 DIAGNOSIS — R7301 Impaired fasting glucose: Secondary | ICD-10-CM

## 2023-01-03 DIAGNOSIS — Z131 Encounter for screening for diabetes mellitus: Secondary | ICD-10-CM

## 2023-01-03 DIAGNOSIS — E7800 Pure hypercholesterolemia, unspecified: Secondary | ICD-10-CM | POA: Diagnosis not present

## 2023-01-03 DIAGNOSIS — F331 Major depressive disorder, recurrent, moderate: Secondary | ICD-10-CM

## 2023-01-03 DIAGNOSIS — J0100 Acute maxillary sinusitis, unspecified: Secondary | ICD-10-CM

## 2023-01-03 DIAGNOSIS — F909 Attention-deficit hyperactivity disorder, unspecified type: Secondary | ICD-10-CM

## 2023-01-03 DIAGNOSIS — F4329 Adjustment disorder with other symptoms: Secondary | ICD-10-CM

## 2023-01-03 MED ORDER — AMPHETAMINE-DEXTROAMPHET ER 15 MG PO CP24: 15.0000 mg | ORAL_CAPSULE | ORAL | 0 refills | Status: DC

## 2023-01-03 MED ORDER — FLUOXETINE HCL 20 MG PO CAPS: 20.0000 mg | ORAL_CAPSULE | Freq: Every day | ORAL | 3 refills | Status: DC

## 2023-01-03 MED ORDER — VANCOMYCIN HCL 125 MG PO CAPS: ORAL_CAPSULE | ORAL | 0 refills | Status: DC

## 2023-01-03 MED ORDER — VITAMIN D (ERGOCALCIFEROL) 1.25 MG (50000 UNIT) PO CAPS: 50000.0000 [IU] | ORAL_CAPSULE | ORAL | 3 refills | Status: DC

## 2023-01-03 MED ORDER — METHYLPREDNISOLONE 4 MG PO TBPK: ORAL_TABLET | ORAL | 0 refills | Status: DC

## 2023-01-03 MED ORDER — AZITHROMYCIN 250 MG PO TABS: ORAL_TABLET | ORAL | 0 refills | Status: DC

## 2023-01-03 MED ORDER — FLUTICASONE PROPIONATE 50 MCG/ACT NA SUSP: 2.0000 | Freq: Every day | NASAL | 0 refills | Status: DC

## 2023-01-03 NOTE — Patient Instructions (Addendum)
Consider metformin and GLP-1.   Insulin Resistance  Insulin is a hormone that is made by the pancreas. Insulin allows blood sugar (glucose) to enter the cells in the body. Insulin helps the body use glucose for energy. Normally, the body is insulin sensitive, which means the cells in the body are effective at absorbing glucose. Insulin resistance is when the cells in the body do not respond properly to insulin and are not able to absorb glucose. The pancreas makes more insulin, but over time the body cannot make enough insulin to keep glucose at normal levels. Insulin resistance results in high blood glucose levels (hyperglycemia) and can lead to problems, including: Prediabetes. Type 2 diabetes (diabetes mellitus). Heart disease. High blood pressure (hypertension). Stroke. Polycystic ovary syndrome (PCOS). Nonalcoholic fatty liver disease. What are the causes? The exact cause of insulin resistance is not known. What increases the risk? The following factors may make you more likely to develop insulin resistance: Being overweight or obese, especially if a lot of your weight is in your waist area. Having an inactive (sedentary) lifestyle. Having above-normal glucose levels. Having abnormal cholesterol levels. Having sleep apnea. Being older than age 13. Using steroids. What are the signs or symptoms? This condition usually does not cause symptoms. A waist measurement of more than 35 inches (88.9 cm) for women and more than 40 inches (101.6 cm) for men may be a sign of insulin resistance. How is this diagnosed? There is no test to diagnose insulin resistance. However, your health care provider may diagnose insulin resistance based on: A physical exam. Your medical history. Blood tests that check your blood glucose level. How is this treated? Insulin resistance is treated with nutrition and lifestyle changes. These changes may include: Eating a healthy balance of nutritious  foods. Getting more physical activity. Maintaining a healthy weight. Stopping the use of any tobacco products. Your health care provider will work with you to change your nutrition and lifestyle as needed. In some cases, treatment may also include medicine to improve your insulin sensitivity. Follow these instructions at home: Activity Be physically active. Do moderate-intensity physical activity for at least 30 minutes on 5 or more days of the week, or as told by your health care provider. This could include brisk walking, biking, or water aerobics. Ask your health care provider what activities are safe for you. A mix of physical activities may be best, such as walking, swimming, biking, and strength training. Eating and drinking  Follow a healthy meal plan. This includes eating: Lean proteins. Complex carbohydrates. Examples of these include whole grains, starchy vegetables (potatoes, corn, peas), and beans. Fresh fruits and vegetables. Low-fat dairy products. Healthy fats. Follow instructions from your health care provider about eating or drinking restrictions. Make an appointment to see a diet and nutrition specialist (registered dietitian) to help you create a healthy eating plan. General instructions Check your blood glucose levels as told by your health care provider. Take over-the-counter and prescription medicines only as told by your health care provider. Lose weight as told by your health care provider. Losing 5-7% of your body weight can reverse insulin resistance. Your health care provider can determine how much weight loss is best for you and can help you lose weight safely. Do not use any products that contain nicotine or tobacco. These products include cigarettes, chewing tobacco, and vaping devices, such as e-cigarettes. If you need help quitting, ask your health care provider. Keep all follow-up visits. This is important. Contact a health care  provider if: You have  trouble losing weight or maintaining your goal weight. You gain weight. You have trouble following your prescribed meal plan. You have trouble exercising more. Summary Insulin resistance occurs when cells in the body do not respond properly to insulin and are not able to absorb blood sugar (glucose). The body makes more insulin, but over time the body cannot make enough insulin to keep blood sugar at normal levels. Insulin resistance is treated with nutrition and lifestyle changes, including eating a healthy balance of nutritious foods, getting more physical activity, and maintaining a healthy weight. Your health care provider will work with you to change your nutrition and lifestyle as needed. Treatment may also include medicine to improve your insulin sensitivity. Check your blood glucose levels as told by your health care provider. Keep all follow-up visits. This is important. This information is not intended to replace advice given to you by your health care provider. Make sure you discuss any questions you have with your health care provider. Document Revised: 07/06/2020 Document Reviewed: 07/06/2020 Elsevier Patient Education  [employer].

## 2023-01-03 NOTE — Progress Notes (Unsigned)
Established Patient Office Visit  Subjective   Patient ID: Jacqueline Hughes, female    DOB: April 05, 1966  Age: 57 y.o. MRN: [id number]  Chief Complaint  Patient presents with   Follow-up    HPI Pt is a 57 yo female who presents to the clinic for refills.   Pt is taking adderall with no problems. Focus is doing well. No concerns.   Her prozac and mood are doing well. No concerns. No SI/HC.   She is concerned about her sugars. Her fastin sugars are averaging 112 but as high as 142. She will fast for 48 hours and still be sugars above 100. She is not losing weight. She is not going into ketosis. She wonders about pre-diabetes and treatment.   She had flu 2 weeks ok. She still has sinus pressure, congestion, headache, cough. No SOB, fever, body aches. She is blowing out green sputum. Worse in mornings. Taking OTC tylenol cold and sinus with some benefits.   .. Active Ambulatory Problems    Diagnosis Date Noted   Adult ADHD 08/25/2014   Depression 08/25/2014   Vitamin D deficiency 08/25/2014   Dyslexia 08/25/2014   Food intolerance 08/29/2014   Osteopenia 08/16/2015   Recurrent cold sores 01/20/2016   Varicose vein of leg 06/17/2017   Primary insomnia 06/17/2017   Cervical herniated disc 06/17/2017   ADD (attention deficit disorder) 03/05/2018   Memory changes 03/05/2018   Word finding difficulty 03/05/2018   Elevated LDL cholesterol level 03/07/2018   Grief reaction with prolonged bereavement 08/02/2018   Stress at work 08/07/2018   Muscle spasm 03/18/2019   Acute stress reaction 03/18/2019   S/P bilateral breast implants 01/17/2020   C. difficile colitis 10/20/2021   Thyroid disease    History of Clostridioides difficile colitis 11/03/2021   Ovarian mass, left 11/05/2021   Vaginal vault prolapse after hysterectomy 05/10/2022   Adjustment insomnia 06/15/2022   Stress fracture of metatarsal bone 12/19/2022   Resolved Ambulatory Problems    Diagnosis Date Noted   Depression,  major, single episode, moderate ([place]) 08/02/2018   Immunization reaction 01/01/2020   Itching 01/17/2020   Rash 01/17/2020   [place] [id number]   Periumbilical pain [id number]   Closed fracture of sesamoid bone of left foot 08/14/2020   Moderate episode of recurrent major depressive disorder ([place]) 03/10/2021   Dehydration 10/18/2021   Diarrhea 10/18/2021   Acute maxillary sinusitis 10/18/2021   Pancolitis ([place]) 10/20/2021   Sepsis ([place]) 10/20/2021   Leukocytosis 10/20/2021   Acute hyponatremia 10/20/2021   Vaginal candidiasis 10/22/2021   Anal itching 11/05/2021   Acute bilateral low back pain without sciatica 11/05/2021   Hypomagnesemia 11/05/2021   Hypokalemia 11/05/2021   Past Medical History:  Diagnosis Date   ADHD (attention deficit hyperactivity disorder)    Arthritis    Family history of adverse reaction to anesthesia    GAD (generalized anxiety disorder)    History of abnormal cervical Pap smear    History of asthma    History of Clostridium difficile colitis 10/19/2021   History of hypothyroidism    Hyperlipidemia    OAB (overactive bladder)    PONV (postoperative nausea and vomiting)    Urethral caruncle    Wears glasses      ROS See HPI.    Objective:     BP 118/82   Pulse 98   Ht '5\' 8"'$  (1.727 m)   Wt 154 lb (69.9 kg)   SpO2 99%   BMI 23.42 kg/m  BP Readings from Last 3 Encounters:  01/03/23 118/82  11/30/22 (!) 111/59  10/21/22 (!) 131/91   Wt Readings from Last 3 Encounters:  01/03/23 154 lb (69.9 kg)  10/03/22 169 lb (76.7 kg)  08/15/22 165 lb 3.2 oz (74.9 kg)  ..    01/03/2023    8:31 AM 06/15/2022    8:08 AM 11/04/2021    3:04 PM 11/03/2021    2:25 PM 10/04/2019    3:37 PM  Depression screen PHQ 2/9  Decreased Interest 0 0 0 0 1  Down, Depressed, Hopeless 0 0 '1 2 1  '$ PHQ - 2 Score 0 0 '1 2 2  '$ Altered sleeping '1   2 1  '$ Tired, decreased energy '1   2 2  '$ Change in appetite 0   0 1  Feeling bad or failure about yourself  0   0 1  Trouble  concentrating '2   1 2  '$ Moving slowly or fidgety/restless 0   0 2  Suicidal thoughts 0   0 0  PHQ-9 Score '4   7 11  '$ Difficult doing work/chores Somewhat difficult   Somewhat difficult Somewhat difficult   ..    01/03/2023    8:31 AM 10/04/2019    3:39 PM 03/18/2019    9:42 AM 07/31/2018    1:13 PM  GAD 7 : Generalized Anxiety Score  Nervous, Anxious, on Edge '1 1 1 1  '$ Control/stop worrying 0 '1 1 1  '$ Worry too much - different things 0 1 1 0  Trouble relaxing '1 1 1 1  '$ Restless '1 1 1 1  '$ Easily annoyed or irritable '1 1 1 3  '$ Afraid - awful might happen 0 1 0 0  Total GAD 7 Score '4 7 6 7  '$ Anxiety Difficulty Somewhat difficult Somewhat difficult Somewhat difficult Somewhat difficult        Physical Exam Constitutional:      Appearance: Normal appearance.  HENT:     Head: Normocephalic.     Comments: Maxillary sinus tenderness to palpation.     Right Ear: Ear canal and external ear normal. There is no impacted cerumen.     Left Ear: Ear canal and external ear normal. There is no impacted cerumen.     Ears:     Comments: Bilateral middle ear effusions    Nose: Congestion present.     Mouth/Throat:     Mouth: Mucous membranes are moist.     Pharynx: Posterior oropharyngeal erythema present. No oropharyngeal exudate.  Eyes:     Conjunctiva/sclera: Conjunctivae normal.  Neck:     Vascular: No carotid bruit.  Cardiovascular:     Rate and Rhythm: Normal rate and regular rhythm.  Pulmonary:     Effort: Pulmonary effort is normal.     Breath sounds: Normal breath sounds.  Abdominal:     Palpations: Abdomen is soft.  Musculoskeletal:     Cervical back: Normal range of motion and neck supple. No rigidity or tenderness.     Right lower leg: No edema.     Left lower leg: No edema.  Lymphadenopathy:     Cervical: Cervical adenopathy present.  Neurological:     General: No focal deficit present.     Mental Status: She is alert and oriented to person, place, and time.  Psychiatric:         Mood and Affect: Mood normal.      The 10-year ASCVD risk score (Arnett DK, et al., 2019) is: 4.2%  Assessment & Plan:  Marland KitchenMarland KitchenFaith was seen today for follow-up.  Diagnoses and all orders for this visit:  Elevated fasting glucose -     Hemoglobin A1c  Healthcare maintenance -     TSH -     Lipid Panel w/reflex Direct LDL -     COMPLETE METABOLIC PANEL WITH GFR -     CBC with Differential/Platelet -     Vitamin D (25 hydroxy) -     Hemoglobin A1c  Elevated LDL cholesterol level -     Lipid Panel w/reflex Direct LDL  Vitamin D deficiency -     Vitamin D (25 hydroxy) -     Vitamin D, Ergocalciferol, (DRISDOL) 1.25 MG (50000 UNIT) CAPS capsule; Take 1 capsule (50,000 Units total) by mouth every 7 (seven) days.  Thyroid disease -     TSH  Diabetes mellitus screening -     COMPLETE METABOLIC PANEL WITH GFR  Acute non-recurrent maxillary sinusitis -     fluticasone (FLONASE) 50 MCG/ACT nasal spray; Place 2 sprays into both nostrils daily. -     methylPREDNISolone (MEDROL DOSEPAK) 4 MG TBPK tablet; Take as directed by package insert. -     azithromycin (ZITHROMAX Z-PAK) 250 MG tablet; Take 2 tablets (500 mg) on  Day 1,  followed by 1 tablet (250 mg) once daily on Days 2 through 5.  Grief reaction with prolonged bereavement -     FLUoxetine (PROZAC) 20 MG capsule; Take 1 capsule (20 mg total) by mouth daily.  Moderate episode of recurrent major depressive disorder (HCC) -     FLUoxetine (PROZAC) 20 MG capsule; Take 1 capsule (20 mg total) by mouth daily.  Adult ADHD -     amphetamine-dextroamphetamine (ADDERALL XR) 15 MG 24 hr capsule; Take 1 capsule by mouth every morning. -     amphetamine-dextroamphetamine (ADDERALL XR) 15 MG 24 hr capsule; Take 1 capsule by mouth every morning. -     amphetamine-dextroamphetamine (ADDERALL XR) 15 MG 24 hr capsule; Take 1 capsule by mouth every morning.  History of Clostridioides difficile infection -     vancomycin (VANCOCIN) 125  MG capsule; Take one tablet daily while on antibiotic and 5 days after.   Adderall refilled for 3 months  Will get labs to evaluate for insulin resistance/pre-diabetes Discussed IF and benefits Encouraged regular exercise 150 minutes a week Consider metformin for insulin resistance  PHQ/GAD numbers improved Refilled prozac  Sinusitis discussed but want to wait as long as we can and try everything we can before giving abx due to her hx of c.diff Start medrol dose pack and flonase Zpak and vancomycin if needed   Iran Planas, PA-C

## 2023-01-04 ENCOUNTER — Encounter: Payer: Self-pay | Admitting: Physician Assistant

## 2023-01-04 LAB — COMPLETE METABOLIC PANEL WITH GFR
AG Ratio: 1.4 (calc) (ref 1.0–2.5)
ALT: 12 U/L (ref 6–29)
AST: 19 U/L (ref 10–35)
Albumin: 4.2 g/dL (ref 3.6–5.1)
Alkaline phosphatase (APISO): 56 U/L (ref 37–153)
BUN: 13 mg/dL (ref 7–25)
CO2: 30 mmol/L (ref 20–32)
Calcium: 9.1 mg/dL (ref 8.6–10.4)
Chloride: 103 mmol/L (ref 98–110)
Creat: 0.55 mg/dL (ref 0.50–1.03)
Globulin: 3 g/dL (calc) (ref 1.9–3.7)
Glucose, Bld: 87 mg/dL (ref 65–99)
Potassium: 4.4 mmol/L (ref 3.5–5.3)
Sodium: 141 mmol/L (ref 135–146)
Total Bilirubin: 0.3 mg/dL (ref 0.2–1.2)
Total Protein: 7.2 g/dL (ref 6.1–8.1)
eGFR: 108 mL/min/{1.73_m2} (ref >=60)

## 2023-01-04 LAB — HEMOGLOBIN A1C
Hgb A1c MFr Bld: 5.5 % of total Hgb (ref <5.7)
Mean Plasma Glucose: 111 mg/dL
eAG (mmol/L): 6.2 mmol/L

## 2023-01-04 LAB — CBC WITH DIFFERENTIAL/PLATELET
Absolute Monocytes: 521 cells/uL (ref 200–950)
Basophils Absolute: 37 cells/uL (ref 0–200)
Basophils Relative: 0.6 %
Eosinophils Absolute: 149 cells/uL (ref 15–500)
Eosinophils Relative: 2.4 %
HCT: 41 % (ref 35.0–45.0)
Hemoglobin: 13.4 g/dL (ref 11.7–15.5)
Lymphs Abs: 1290 cells/uL (ref 850–3900)
MCH: 28.9 pg (ref 27.0–33.0)
MCHC: 32.7 g/dL (ref 32.0–36.0)
MCV: 88.6 fL (ref 80.0–100.0)
MPV: 9.4 fL (ref 7.5–12.5)
Monocytes Relative: 8.4 %
Neutro Abs: 4204 cells/uL (ref 1500–7800)
Neutrophils Relative %: 67.8 %
Platelets: 335 10*3/uL (ref 140–400)
RBC: 4.63 10*6/uL (ref 3.80–5.10)
RDW: 12.9 % (ref 11.0–15.0)
Total Lymphocyte: 20.8 %
WBC: 6.2 10*3/uL (ref 3.8–10.8)

## 2023-01-04 LAB — LIPID PANEL W/REFLEX DIRECT LDL
Cholesterol: 201 mg/dL — ABNORMAL HIGH (ref <200)
HDL: 55 mg/dL (ref >=50)
LDL Cholesterol (Calc): 130 mg/dL (calc) — ABNORMAL HIGH
Non-HDL Cholesterol (Calc): 146 mg/dL (calc) — ABNORMAL HIGH (ref <130)
Total CHOL/HDL Ratio: 3.7 (calc) (ref <5.0)
Triglycerides: 66 mg/dL (ref <150)

## 2023-01-04 LAB — TSH: TSH: 2.03 mIU/L

## 2023-01-04 LAB — VITAMIN D 25 HYDROXY (VIT D DEFICIENCY, FRACTURES): Vit D, 25-Hydroxy: 43 ng/mL (ref 30–100)

## 2023-01-04 NOTE — Progress Notes (Signed)
Jacqueline Hughes,   A1C is in normal range but close to entering the pre-diabetes range. Average sugars 111.  Vitamin D looks good.  Hemoglobin looks good.  Kidney, liver, glucose look great.  Thyroid is normal.  HDL, good cholesterol, looks great.  LDL is not to optimal goal but stable. With you doing keto this really is a good level.   10 year risk is under 7.5 percent which means we can continue to work on diet and exercise.   [name].The 10-year ASCVD risk score (Arnett DK, et al., 2019) is: 4.2%   Values used to calculate the score:     Age: 56 years     Sex: Female     Is Non-Hispanic African American: No     Diabetic: No     Tobacco smoker: Yes     Systolic Blood Pressure: 123456 mmHg     Is BP treated: No     HDL Cholesterol: 55 mg/dL     Total Cholesterol: 201 mg/dL

## 2023-01-11 ENCOUNTER — Ambulatory Visit (INDEPENDENT_AMBULATORY_CARE_PROVIDER_SITE_OTHER): Payer: BC Managed Care – PPO | Admitting: Obstetrics and Gynecology

## 2023-01-11 ENCOUNTER — Encounter: Payer: Self-pay | Admitting: Obstetrics and Gynecology

## 2023-01-11 VITALS — BP 104/73 | HR 83

## 2023-01-11 DIAGNOSIS — N3281 Overactive bladder: Secondary | ICD-10-CM | POA: Diagnosis not present

## 2023-01-11 NOTE — Progress Notes (Signed)
 Urogynecology Return Visit  SUBJECTIVE  History of Present Illness: Jacqueline Hughes is a 57 y.o. female seen in follow-up for overactive bladder.   Surgery: s/p s/p midurethral sling, cystoscopy on 10/03/22. Also s/p urethral bulking on 08/15/22, and s/p Robotic assisted laparoscopic lysis of adhesions, bilateral salpingo-oophorectomy, sacrocolpopexy, cystoscopy on 05/10/22     Started on Gemtesa '75mg'$  and she feels her OAB symptoms have improved significantly. She is having rare leakage or urgency. Denies burning with urination, not having bladder spasms. Medication is covered by her insurance.    Past Medical History: Patient  has a past medical history of ADHD (attention deficit hyperactivity disorder), Arthritis, Depression, Family history of adverse reaction to anesthesia, GAD (generalized anxiety disorder), History of abnormal cervical Pap smear, History of asthma, History of Clostridium difficile colitis (10/19/2021), History of hypothyroidism, Hyperlipidemia, OAB (overactive bladder), Osteopenia, PONV (postoperative nausea and vomiting), Urethral caruncle, Vitamin D deficiency, and Wears glasses.   Past Surgical History: She  has a past surgical history that includes Abdominal hysterectomy (2003); laparotomy; Augmentation mammaplasty (Bilateral); LEEP; Bladder suspension; Colonoscopy; Robotic assisted salpingo oophorectomy (Bilateral, 05/10/2022); Robotic assisted laparoscopic sacrocolpopexy (N/A, 05/10/2022); Cystoscopy (N/A, 05/10/2022); Bladder suspension (N/A, 10/03/2022); and Cystoscopy (N/A, 10/03/2022).   Medications: She has a current medication list which includes the following prescription(s): acetaminophen, alprazolam, amphetamine-dextroamphetamine, [START ON 02/03/2023] amphetamine-dextroamphetamine, [START ON 03/05/2023] amphetamine-dextroamphetamine, azithromycin, cyclobenzaprine, estradiol, fluoxetine, fluticasone, ibuprofen, methylprednisolone, multivitamin women, probiotic  product, valacyclovir, vancomycin, gemtesa, and vitamin d (ergocalciferol).   Allergies: Patient is allergic to e-mycin [erythromycin], lexapro [escitalopram oxalate], augmentin [amoxicillin-pot clavulanate], lactose intolerance (gi), covid-19 mrna vaccine (pfizer) [covid-19 mrna vacc (moderna)], and gadolinium.   Social History: Patient  reports that she quit smoking about 18 years ago. Her smoking use included cigarettes. She has never been exposed to tobacco smoke. She has never used smokeless tobacco. She reports current alcohol use. She reports that she does not use drugs.      OBJECTIVE     Physical Exam: Vitals:   01/11/23 1529  BP: 104/73  Pulse: 83   Gen: No apparent distress, A&O x 3.  Detailed Urogynecologic Evaluation:  Deferred.    ASSESSMENT AND PLAN    Jacqueline Hughes is a 57 y.o. with:  1. Overactive bladder    - Continue with Gemtesa '75mg'$ . Will refill gemtesa when pharmacy requests.  - Can f/u 1 year (Feb 2025) or sooner if needed  [name], MD  Time spent: I spent 15 minutes dedicated to the care of this patient on the date of this encounter to include pre-visit review of records, face-to-face time with the patient and post visit documentation and ordering medication/ testing.

## 2023-01-19 ENCOUNTER — Encounter: Payer: Self-pay | Admitting: Physician Assistant

## 2023-01-19 ENCOUNTER — Ambulatory Visit: Payer: BC Managed Care – PPO | Admitting: Sports Medicine

## 2023-01-19 ENCOUNTER — Other Ambulatory Visit: Payer: Self-pay | Admitting: Family Medicine

## 2023-01-19 ENCOUNTER — Telehealth: Payer: Self-pay | Admitting: Family Medicine

## 2023-01-19 DIAGNOSIS — Z8619 Personal history of other infectious and parasitic diseases: Secondary | ICD-10-CM

## 2023-01-19 NOTE — Progress Notes (Signed)
History of C-diff with recurrent diarrhea after antibiotic use.

## 2023-01-19 NOTE — Telephone Encounter (Signed)
Spoke with patient on phone. She will stop by and pick up containers to submit stool sample for evaluation of diarrhea.

## 2023-01-20 ENCOUNTER — Encounter: Payer: Self-pay | Admitting: Family Medicine

## 2023-01-20 ENCOUNTER — Ambulatory Visit (INDEPENDENT_AMBULATORY_CARE_PROVIDER_SITE_OTHER): Payer: BC Managed Care – PPO

## 2023-01-20 ENCOUNTER — Ambulatory Visit: Payer: BC Managed Care – PPO | Admitting: Family Medicine

## 2023-01-20 VITALS — BP 111/76 | HR 89 | Temp 97.6°F | Resp 18 | Ht 68.0 in | Wt 150.9 lb

## 2023-01-20 DIAGNOSIS — R0989 Other specified symptoms and signs involving the circulatory and respiratory systems: Secondary | ICD-10-CM

## 2023-01-20 DIAGNOSIS — R051 Acute cough: Secondary | ICD-10-CM | POA: Diagnosis not present

## 2023-01-20 DIAGNOSIS — A0472 Enterocolitis due to Clostridium difficile, not specified as recurrent: Secondary | ICD-10-CM | POA: Diagnosis not present

## 2023-01-20 MED ORDER — ALBUTEROL SULFATE HFA 108 (90 BASE) MCG/ACT IN AERS: 2.0000 | INHALATION_SPRAY | Freq: Four times a day (QID) | RESPIRATORY_TRACT | 0 refills | Status: DC | PRN

## 2023-01-20 MED ORDER — VANCOMYCIN HCL 125 MG PO CAPS: 125.0000 mg | ORAL_CAPSULE | Freq: Four times a day (QID) | ORAL | 0 refills | Status: AC

## 2023-01-20 NOTE — Telephone Encounter (Signed)
Patient given an appointment today and already seen at 8:30 AM.

## 2023-01-20 NOTE — Progress Notes (Signed)
Established Patient Office Visit  Subjective   Patient ID: Jacqueline Hughes, female    DOB: 1966-07-10  Age: 57 y.o. MRN: [id number]  Chief Complaint  Patient presents with   URI    Patient states that she has had this upper respiratory issue for about a month she states that she finished the antibiotics for this but still is having issues    HPI  Presents today for an acute visit with complaint of ongoing URI with cough present for 4 weeks before seeing PCP. Received antibiotic for URI and Vanc for history of C-diff. Did not start antibiotic until 01/14/23 after getting prescription on 3/5. Diarrhea stools x 9 yesterday and 3 stools this morning. Stool is green and brown. History of C-diff (hospitalized for this in past). Feels like these stools are similar to when she had C-diff in past.  Cough has worsened with shortness of breath this past weekend. She has completed course of azithromycin and feels this has improved somewhat but continues to cough frequently.  Symptoms have been present  more than 4 weeks Associated symptoms include: "I couldn't breath this past weekend" shortness of breath.  Pertinent negatives: no fever or chills Pain severity: abdominal tenderness with diarrhea Treatments tried include : azithromax and vanc for c-diff, taking allergy medicine, no cough medication.  Treatment effective : not effective  Sick contacts : n/a    Review of Systems  Constitutional:  Negative for chills and fever.  HENT:  Positive for congestion.   Respiratory:  Positive for cough and shortness of breath (this past weekend.).   Cardiovascular:  Positive for chest pain (with coughing).  Gastrointestinal:  Positive for diarrhea.      Objective:     BP 111/76   Pulse 89   Temp 97.6 F (36.4 C) (Oral)   Resp 18   Ht 5\' 8"  (1.727 m)   Wt 150 lb 14.4 oz (68.4 kg)   SpO2 96%   BMI 22.94 kg/m  BP Readings from Last 3 Encounters:  01/20/23 111/76  01/11/23 104/73  01/03/23 118/82       Physical Exam Vitals and nursing note reviewed.  Constitutional:      General: She is not in acute distress.    Appearance: Normal appearance.  HENT:     Right Ear: Tympanic membrane normal.     Left Ear: Tympanic membrane normal.     Nose:     Right Sinus: No maxillary sinus tenderness or frontal sinus tenderness.     Left Sinus: No maxillary sinus tenderness or frontal sinus tenderness.     Mouth/Throat:     Pharynx: Uvula midline. No oropharyngeal exudate or posterior oropharyngeal erythema.  Cardiovascular:     Rate and Rhythm: Regular rhythm.     Heart sounds: Normal heart sounds.  Pulmonary:     Effort: Pulmonary effort is normal.     Breath sounds: Wheezing and rhonchi present.  Skin:    General: Skin is warm and dry.  Neurological:     General: No focal deficit present.     Mental Status: She is alert. Mental status is at baseline.  Psychiatric:        Mood and Affect: Mood normal.        Behavior: Behavior normal.        Thought Content: Thought content normal.        Judgment: Judgment normal.     No results found for any visits on 01/20/23.  The 10-year ASCVD risk score (Arnett DK, et al., 2019) is: 3.7%    Assessment & Plan:   Problem List Items Addressed This Visit     C. difficile diarrhea   Relevant Medications   vancomycin (VANCOCIN) 125 MG capsule  12 liquid stools in past 24 hours. Hospitalized in past for C-diff. Has been taking vancocin Q day since going on antibiotic with continued diarrhea. Stool culture submitted yesterday, will increase frequency of vancocin to QID while awaiting culture results.    Rhonchi at both lung bases   Relevant Orders   DG Chest 2 View  Lung sounds course throughout with rhonchi and wheezing with report of shortness of breath over the weekend. She is able to converse with provider during visit without obvious shortness of breath. Vital signs are stable, no fevers. Will send for stat chest x-ray to rule  out pneumonia. If positive, will prescribe antibiotic. Albuterol 108 mcg inhaler sent to pharmacy. She will take 2 inhalations every 6 hours as needed.    Chest x-ray negative for acute infiltrate. No further antibiotics needed.    Acute cough - Primary   Relevant Orders   DG Chest 2 View  Recommend increasing water and taking expectorant OTC and inhaler as instructed.   Agrees with plan of care discussed.  Questions answered. Results of negative chest x-ray sent through My Chart. Continue with plan as instructed during office visit.  Increase water intake. Work note provided.  Follow-up with PCP if symptoms do not improve.   Return if symptoms worsen or fail to improve.    Chalmers Guest, FNP

## 2023-01-24 ENCOUNTER — Encounter: Payer: Self-pay | Admitting: Physician Assistant

## 2023-01-24 LAB — CDIFF NAA+O+P+STOOL CULTURE
E coli, Shiga toxin Assay: NEGATIVE
Toxigenic C. Difficile by PCR: NEGATIVE

## 2023-01-25 ENCOUNTER — Other Ambulatory Visit: Payer: Self-pay | Admitting: Physician Assistant

## 2023-01-25 DIAGNOSIS — J0100 Acute maxillary sinusitis, unspecified: Secondary | ICD-10-CM

## 2023-01-25 MED ORDER — DICYCLOMINE HCL 10 MG PO CAPS: 10.0000 mg | ORAL_CAPSULE | Freq: Three times a day (TID) | ORAL | 0 refills | Status: DC

## 2023-01-25 NOTE — Addendum Note (Signed)
Addended by: Donella Stade on: 01/25/2023 07:04 AM   Modules accepted: Orders

## 2023-02-01 ENCOUNTER — Telehealth: Payer: Self-pay

## 2023-02-01 NOTE — Telephone Encounter (Signed)
Patient came into office to drop off [employer] [facility] form, forms placed in [place] box, thanks.

## 2023-02-11 ENCOUNTER — Other Ambulatory Visit: Payer: Self-pay | Admitting: Family Medicine

## 2023-02-11 DIAGNOSIS — R0989 Other specified symptoms and signs involving the circulatory and respiratory systems: Secondary | ICD-10-CM

## 2023-02-16 ENCOUNTER — Other Ambulatory Visit: Payer: Self-pay | Admitting: Physician Assistant

## 2023-08-09 ENCOUNTER — Other Ambulatory Visit: Payer: Self-pay | Admitting: Physician Assistant

## 2023-08-09 DIAGNOSIS — F909 Attention-deficit hyperactivity disorder, unspecified type: Secondary | ICD-10-CM

## 2023-08-10 ENCOUNTER — Other Ambulatory Visit: Payer: Self-pay | Admitting: Family Medicine

## 2023-08-10 MED ORDER — AMPHETAMINE-DEXTROAMPHET ER 15 MG PO CP24: 15.0000 mg | ORAL_CAPSULE | ORAL | 0 refills | Status: DC

## 2023-08-10 NOTE — Progress Notes (Signed)
v

## 2023-08-10 NOTE — Telephone Encounter (Signed)
Meds ordered this encounter  Medications   amphetamine-dextroamphetamine (ADDERALL XR) 15 MG 24 hr capsule    Sig: Take 1 capsule by mouth every morning.    Dispense:  30 capsule    Refill:  0   Please call patient: I did go ahead and send her medication but she is very overdue for a follow-up for her ADD medication.  Please get her scheduled with Lesly Rubenstein later this month.

## 2023-08-10 NOTE — Telephone Encounter (Signed)
Requesting rx rf of Adderall XR 15  Last written  03/05/2023 Last OV 01/03/2023  No upcoming appt schld

## 2023-08-11 NOTE — Telephone Encounter (Signed)
Left voicemail for patient to call and schedule an appointment

## 2023-08-29 ENCOUNTER — Encounter: Payer: Self-pay | Admitting: Physician Assistant

## 2023-08-29 ENCOUNTER — Ambulatory Visit: Payer: BC Managed Care – PPO | Admitting: Physician Assistant

## 2023-08-29 VITALS — BP 117/83 | HR 91 | Ht 68.0 in | Wt 145.0 lb

## 2023-08-29 DIAGNOSIS — R7301 Impaired fasting glucose: Secondary | ICD-10-CM

## 2023-08-29 DIAGNOSIS — Z1211 Encounter for screening for malignant neoplasm of colon: Secondary | ICD-10-CM

## 2023-08-29 DIAGNOSIS — F909 Attention-deficit hyperactivity disorder, unspecified type: Secondary | ICD-10-CM | POA: Diagnosis not present

## 2023-08-29 DIAGNOSIS — M8580 Other specified disorders of bone density and structure, unspecified site: Secondary | ICD-10-CM

## 2023-08-29 DIAGNOSIS — F331 Major depressive disorder, recurrent, moderate: Secondary | ICD-10-CM

## 2023-08-29 LAB — POCT GLYCOSYLATED HEMOGLOBIN (HGB A1C): Hemoglobin A1C: 5.2 % (ref 4.0–5.6)

## 2023-08-29 MED ORDER — FLUOXETINE HCL 10 MG PO CAPS: 10.0000 mg | ORAL_CAPSULE | Freq: Every day | ORAL | 0 refills | Status: DC

## 2023-08-29 MED ORDER — AMPHETAMINE-DEXTROAMPHET ER 20 MG PO CP24: 20.0000 mg | ORAL_CAPSULE | ORAL | 0 refills | Status: DC

## 2023-08-29 NOTE — Progress Notes (Unsigned)
Established Patient Office Visit  Subjective   Patient ID: Jacqueline Hughes, female    DOB: 1966-09-18  Age: 57 y.o. MRN: [id number]  Chief Complaint  Patient presents with   Medical Management of Chronic Issues    Medication fup and refills     HPI Pt is a 57 yo female with MDD, ADHD who presents to the clinic with worsening mood since the weather changed. No other life events. She is doing pretty good. She has not been exercising as much. She feels less focused and like her adderall and prozac are not working. She is easily tearful.   She would like bone density last one 2016 and showed osteopenia. Taking vitamin D and calcium.    Active Ambulatory Problems    Diagnosis Date Noted   Adult ADHD 08/25/2014   Depression 08/25/2014   Vitamin D deficiency 08/25/2014   Dyslexia 08/25/2014   Food intolerance 08/29/2014   Osteopenia 08/16/2015   Recurrent cold sores 01/20/2016   Varicose vein of leg 06/17/2017   Primary insomnia 06/17/2017   Cervical herniated disc 06/17/2017   ADD (attention deficit disorder) 03/05/2018   Memory changes 03/05/2018   Word finding difficulty 03/05/2018   Elevated LDL cholesterol level 03/07/2018   Grief reaction with prolonged bereavement 08/02/2018   Stress at work 08/07/2018   Muscle spasm 03/18/2019   Acute stress reaction 03/18/2019   S/P bilateral breast implants 01/17/2020   C. difficile diarrhea 10/20/2021   Thyroid disease    History of Clostridioides difficile colitis 11/03/2021   Ovarian mass, left 11/05/2021   Vaginal vault prolapse after hysterectomy 05/10/2022   Adjustment insomnia 06/15/2022   Stress fracture of metatarsal bone 12/19/2022   Rhonchi at both lung bases 01/20/2023   Acute cough 01/20/2023   Resolved Ambulatory Problems    Diagnosis Date Noted   Depression, major, single episode, moderate (HCC) 08/02/2018   Immunization reaction 01/01/2020   Itching 01/17/2020   Rash 01/17/2020   Flushing 01/17/2020    Periumbilical pain 06/03/2020   Closed fracture of sesamoid bone of left foot 08/14/2020   Moderate episode of recurrent major depressive disorder (HCC) 03/10/2021   Dehydration 10/18/2021   Diarrhea 10/18/2021   Acute maxillary sinusitis 10/18/2021   Pancolitis (HCC) 10/20/2021   Sepsis (HCC) 10/20/2021   Leukocytosis 10/20/2021   Acute hyponatremia 10/20/2021   Vaginal candidiasis 10/22/2021   Anal itching 11/05/2021   Acute bilateral low back pain without sciatica 11/05/2021   Hypomagnesemia 11/05/2021   Hypokalemia 11/05/2021   Past Medical History:  Diagnosis Date   ADHD (attention deficit hyperactivity disorder)    Arthritis    Family history of adverse reaction to anesthesia    GAD (generalized anxiety disorder)    History of abnormal cervical Pap smear    History of asthma    History of Clostridium difficile colitis 10/19/2021   History of hypothyroidism    Hyperlipidemia    OAB (overactive bladder)    PONV (postoperative nausea and vomiting)    Urethral caruncle    Wears glasses     ROS See HPI.    Objective:     BP 117/83   Pulse 91   Ht 5\' 8"  (1.727 m)   Wt 145 lb (65.8 kg)   SpO2 99%   BMI 22.05 kg/m  BP Readings from Last 3 Encounters:  08/29/23 117/83  01/20/23 111/76  01/11/23 104/73   Wt Readings from Last 3 Encounters:  08/29/23 145 lb (65.8 kg)  01/20/23 150  lb 14.4 oz (68.4 kg)  01/03/23 154 lb (69.9 kg)  ..    08/29/2023    3:35 PM 08/29/2023    3:15 PM 01/03/2023    8:31 AM 06/15/2022    8:08 AM 11/04/2021    3:04 PM  Depression screen PHQ 2/9  Decreased Interest 1 1 0 0 0  Down, Depressed, Hopeless 2 2 0 0 1  PHQ - 2 Score 3 3 0 0 1  Altered sleeping 3  1    Tired, decreased energy 2  1    Change in appetite 1  0    Feeling bad or failure about yourself  0  0    Trouble concentrating 2  2    Moving slowly or fidgety/restless 1  0    Suicidal thoughts 0  0    PHQ-9 Score 12  4    Difficult doing work/chores   Somewhat  difficult     ..    08/29/2023    3:35 PM 01/03/2023    8:31 AM 10/04/2019    3:39 PM 03/18/2019    9:42 AM  GAD 7 : Generalized Anxiety Score  Nervous, Anxious, on Edge 1 1 1 1   Control/stop worrying 1 0 1 1  Worry too much - different things 1 0 1 1  Trouble relaxing 1 1 1 1   Restless 1 1 1 1   Easily annoyed or irritable 1 1 1 1   Afraid - awful might happen 0 0 1 0  Total GAD 7 Score 6 4 7 6   Anxiety Difficulty Not difficult at all Somewhat difficult Somewhat difficult Somewhat difficult        Physical Exam Constitutional:      Appearance: Normal appearance.  HENT:     Head: Normocephalic.  Cardiovascular:     Rate and Rhythm: Normal rate and regular rhythm.  Pulmonary:     Effort: Pulmonary effort is normal.     Breath sounds: Normal breath sounds.  Musculoskeletal:     Right lower leg: No edema.     Left lower leg: No edema.  Neurological:     General: No focal deficit present.     Mental Status: She is alert and oriented to person, place, and time.  Psychiatric:        Mood and Affect: Mood normal.      The 10-year ASCVD risk score (Arnett DK, et al., 2019) is: 1.9%    Assessment & Plan:  Marland KitchenMarland KitchenFaith was seen today for medical management of chronic issues.  Diagnoses and all orders for this visit:  Moderate episode of recurrent major depressive disorder (HCC) -     FLUoxetine (PROZAC) 10 MG capsule; Take 1 capsule (10 mg total) by mouth daily. To take with 20mg  capsule.  Osteopenia, unspecified location -     DG Bone Density; Future  Adult ADHD -     amphetamine-dextroamphetamine (ADDERALL XR) 20 MG 24 hr capsule; Take 1 capsule (20 mg total) by mouth every morning. -     amphetamine-dextroamphetamine (ADDERALL XR) 20 MG 24 hr capsule; Take 1 capsule (20 mg total) by mouth every morning. -     amphetamine-dextroamphetamine (ADDERALL XR) 20 MG 24 hr capsule; Take 1 capsule (20 mg total) by mouth every morning.  Elevated fasting glucose -     POCT HgB  A1C  Colon cancer screening -     Ambulatory referral to Gastroenterology  PHQ/GAD not to goal Increase prozac to 30mg  to take 20mg  and  add 10mg  daily Increased adderall to 20mg  XR Encouraged to get SAD light Make sure to get 10,000 steps a day  A1C normal looks great!   Referral for colonoscopy.  Ordered bone density for follow up, hx of osteopenia.  Encouraged to get shingrix at pharmacy  Return in about 3 months (around 11/29/2023).    Tandy Gaw, PA-C

## 2023-09-07 ENCOUNTER — Other Ambulatory Visit: Payer: Self-pay | Admitting: Obstetrics and Gynecology

## 2023-09-07 DIAGNOSIS — N3281 Overactive bladder: Secondary | ICD-10-CM

## 2023-11-19 ENCOUNTER — Other Ambulatory Visit: Payer: Self-pay | Admitting: Physician Assistant

## 2023-11-19 DIAGNOSIS — F331 Major depressive disorder, recurrent, moderate: Secondary | ICD-10-CM

## 2023-12-19 ENCOUNTER — Telehealth (INDEPENDENT_AMBULATORY_CARE_PROVIDER_SITE_OTHER): Payer: 59 | Admitting: Obstetrics and Gynecology

## 2023-12-19 ENCOUNTER — Ambulatory Visit: Payer: 59 | Admitting: Obstetrics and Gynecology

## 2023-12-19 DIAGNOSIS — N3281 Overactive bladder: Secondary | ICD-10-CM | POA: Diagnosis not present

## 2023-12-19 MED ORDER — GEMTESA 75 MG PO TABS: 1.0000 | ORAL_TABLET | Freq: Every day | ORAL | 3 refills | Status: DC

## 2023-12-19 NOTE — Progress Notes (Signed)
[facility] Return Visit- video Patient location- at home in [facility] Provider location- in office Pt consented to the video visit and her husband was present with her for part of the call.   SUBJECTIVE  History of Present Illness: Jacqueline Hughes is a 58 y.o. female seen in follow-up for overactive bladder.   Surgery: s/p s/p midurethral sling, cystoscopy on 10/03/22. Also s/p urethral bulking on 08/15/22, and s/p Robotic assisted laparoscopic lysis of adhesions, bilateral salpingo-oophorectomy, sacrocolpopexy, cystoscopy on 05/10/22     Gemtesa 75mg  is still working well to help control her urgency symptoms.  Denies any issues with urination or emptying her bladder. Medication is covered by her insurance. No SUI symptoms. Denies any prolapse symptoms.   No new updates to medical history.   Past Medical History: Patient  has a past medical history of ADHD (attention deficit hyperactivity disorder), Arthritis, Depression, Family history of adverse reaction to anesthesia, GAD (generalized anxiety disorder), History of abnormal cervical Pap smear, History of asthma, History of Clostridium difficile colitis (10/19/2021), History of hypothyroidism, Hyperlipidemia, OAB (overactive bladder), Osteopenia, PONV (postoperative nausea and vomiting), Urethral caruncle, Vitamin D deficiency, and Wears glasses.   Past Surgical History: She  has a past surgical history that includes Abdominal hysterectomy (2003); laparotomy; Augmentation mammaplasty (Bilateral); LEEP; Bladder suspension; Colonoscopy; Robotic assisted salpingo oophorectomy (Bilateral, 05/10/2022); Robotic assisted laparoscopic sacrocolpopexy (N/A, 05/10/2022); Cystoscopy (N/A, 05/10/2022); Bladder suspension (N/A, 10/03/2022); and Cystoscopy (N/A, 10/03/2022).   Medications: She has a current medication list which includes the following prescription(s): acetaminophen, albuterol, alprazolam, amphetamine-dextroamphetamine,  amphetamine-dextroamphetamine, amphetamine-dextroamphetamine, amphetamine-dextroamphetamine, cyclobenzaprine, dicyclomine, estradiol, fluoxetine, fluoxetine, fluticasone, ibuprofen, multivitamin women, probiotic product, valacyclovir, gemtesa, and vitamin d (ergocalciferol).   Allergies: Patient is allergic to e-mycin [erythromycin], lexapro [escitalopram oxalate], augmentin [amoxicillin-pot clavulanate], lactose intolerance (gi), covid-19 mrna vaccine (pfizer) [covid-19 mrna vacc (moderna)], and gadolinium.   Social History: Patient  reports that she quit smoking about 19 years ago. Her smoking use included cigarettes. She has never been exposed to tobacco smoke. She has never used smokeless tobacco. She reports current alcohol use. She reports that she does not use drugs.      OBJECTIVE     Physical Exam: There were no vitals filed for this visit.  Gen: No apparent distress   ASSESSMENT AND PLAN    Jacqueline Hughes is a 58 y.o. with:  1. Overactive bladder     - Continue with Gemtesa 75mg .  - Can f/u 1 year (Feb 2026) or sooner if needed  Jacqueline Beards, MD  Time spent: I spent 15 minutes dedicated to the care of this patient on the date of this encounter to include pre-visit review of records, face-to-face time with the patient and post visit documentation and ordering medication/ testing.

## 2023-12-25 ENCOUNTER — Ambulatory Visit (INDEPENDENT_AMBULATORY_CARE_PROVIDER_SITE_OTHER): Payer: 59 | Admitting: Sports Medicine

## 2023-12-25 DIAGNOSIS — N362 Urethral caruncle: Secondary | ICD-10-CM | POA: Diagnosis not present

## 2023-12-25 DIAGNOSIS — N3001 Acute cystitis with hematuria: Secondary | ICD-10-CM

## 2023-12-25 DIAGNOSIS — N952 Postmenopausal atrophic vaginitis: Secondary | ICD-10-CM

## 2023-12-25 DIAGNOSIS — R300 Dysuria: Secondary | ICD-10-CM | POA: Diagnosis not present

## 2023-12-25 DIAGNOSIS — N3000 Acute cystitis without hematuria: Secondary | ICD-10-CM | POA: Insufficient documentation

## 2023-12-25 LAB — POCT URINALYSIS DIP (CLINITEK)
Bilirubin, UA: NEGATIVE
Glucose, UA: NEGATIVE mg/dL
Ketones, POC UA: NEGATIVE mg/dL
Nitrite, UA: POSITIVE — AB
POC PROTEIN,UA: 30 — AB
Spec Grav, UA: 1.015 (ref 1.010–1.025)
Urobilinogen, UA: 0.2 U/dL
pH, UA: 7 (ref 5.0–8.0)

## 2023-12-25 MED ORDER — NITROFURANTOIN MONOHYD MACRO 100 MG PO CAPS: 100.0000 mg | ORAL_CAPSULE | Freq: Two times a day (BID) | ORAL | 0 refills | Status: DC

## 2023-12-25 MED ORDER — FLUCONAZOLE 150 MG PO TABS: 150.0000 mg | ORAL_TABLET | Freq: Once | ORAL | 3 refills | Status: AC

## 2023-12-25 MED ORDER — ESTRADIOL 0.1 MG/GM VA CREA: 0.5000 g | TOPICAL_CREAM | VAGINAL | 11 refills | Status: AC

## 2023-12-25 NOTE — Assessment & Plan Note (Signed)
 This is a very pleasant 58 year old female, she had several days of increasing urinary frequency, urgency, burning. On exam today she has tenderness suprapubic. She does have atrophic vaginitis but has not been using her estrogen cream. She does wipe front to back, she hydrates aggressively. No sexual activity currently. Urinalysis is positive for blood, leukocytes, nitrates. We will culture the urine, adding Macrobid, Diflucan. She has a history of C. difficile colitis, at the first sign of any increasing diarrhea I am happy to immediately start oral vancomycin.

## 2023-12-25 NOTE — Progress Notes (Signed)
    Procedures performed today:    None.  Independent interpretation of notes and tests performed by another provider:   None.  Brief History, Exam, Impression, and Recommendations:    Acute cystitis This is a very pleasant 58 year old female, she had several days of increasing urinary frequency, urgency, burning. On exam today she has tenderness suprapubic. She does have atrophic vaginitis but has not been using her estrogen cream. She does wipe front to back, she hydrates aggressively. No sexual activity currently. Urinalysis is positive for blood, leukocytes, nitrates. We will culture the urine, adding Macrobid, Diflucan. She has a history of C. difficile colitis, at the first sign of any increasing diarrhea I am happy to immediately start oral vancomycin.  I spent 30 minutes of total time managing this patient today, this includes chart review, face to face, and non-face to face time.  ____________________________________________ Ihor Austin. Benjamin Stain, M.D., ABFM., CAQSM., AME. Primary Care and Sports Medicine [facility]  Adjunct Professor of Family Medicine  [employer]  Restaurant manager, fast food

## 2023-12-28 LAB — URINE CULTURE

## 2023-12-29 ENCOUNTER — Telehealth: Payer: Self-pay

## 2023-12-29 NOTE — Telephone Encounter (Signed)
 Copied from CRM [id number]. 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

## 2023-12-29 NOTE — Telephone Encounter (Signed)
 Spoke with patient.  Nothing further needed from [place] at this time.

## 2023-12-29 NOTE — Telephone Encounter (Signed)
 Copied from CRM [id number]. Topic: Clinical - Lab/Test Results >> Dec 29, 2023 11:55 AM Nila Nephew wrote: Reason for CRM: Patient returning lab results call. Patient has seen results in MyChart. Provided provider feedback verbatim, patient expresses understanding with no questions at this time.

## 2023-12-29 NOTE — Telephone Encounter (Signed)
 Patient informed of lab results by Cira Servant during CRM call.

## 2024-01-06 ENCOUNTER — Other Ambulatory Visit: Payer: Self-pay | Admitting: Sports Medicine

## 2024-01-06 ENCOUNTER — Other Ambulatory Visit: Payer: Self-pay

## 2024-01-06 ENCOUNTER — Ambulatory Visit
Admission: EM | Admit: 2024-01-06 | Discharge: 2024-01-06 | Disposition: A | Discharge status: Home / Self Care | Attending: Family Medicine | Admitting: Family Medicine

## 2024-01-06 DIAGNOSIS — N3001 Acute cystitis with hematuria: Secondary | ICD-10-CM | POA: Diagnosis present

## 2024-01-06 DIAGNOSIS — R300 Dysuria: Secondary | ICD-10-CM | POA: Diagnosis present

## 2024-01-06 LAB — POCT URINALYSIS DIP (MANUAL ENTRY)
Bilirubin, UA: NEGATIVE
Glucose, UA: NEGATIVE mg/dL
Ketones, POC UA: NEGATIVE mg/dL
Nitrite, UA: NEGATIVE
Protein Ur, POC: NEGATIVE mg/dL
Spec Grav, UA: 1.01 (ref 1.010–1.025)
Urobilinogen, UA: 0.2 U/dL — AB
pH, UA: 6.5 (ref 5.0–8.0)

## 2024-01-06 MED ORDER — NITROFURANTOIN MONOHYD MACRO 100 MG PO CAPS: 100.0000 mg | ORAL_CAPSULE | Freq: Two times a day (BID) | ORAL | 0 refills | Status: AC

## 2024-01-06 NOTE — ED Provider Notes (Signed)
 [facility]    CSN: [id number] Arrival date & time: 01/06/24  1534      History   Chief Complaint Chief Complaint  Patient presents with   Dysuria   Hematuria    HPI Jacqueline Hughes is a 58 y.o. female.   HPI 58 year old female presents with dysuria and hematuria that began today.  Patient has history of UTI and recently finished antibiotic on 3/3.  PMH significant for C. difficile diarrhea, memory changes, and acute cystitis  Past Medical History:  Diagnosis Date   ADHD (attention deficit hyperactivity disorder)    Arthritis    Depression    Family history of adverse reaction to anesthesia    mother--- ponv   GAD (generalized anxiety disorder)    History of abnormal cervical Pap smear    prior to hysterectomy, pap smears normal after hysterectomy   History of asthma    History of Clostridium difficile colitis 10/19/2021   take augmentin   History of hypothyroidism    Hyperlipidemia    OAB (overactive bladder)    Osteopenia    PONV (postoperative nausea and vomiting)    Urethral caruncle    Vitamin D deficiency    Wears glasses     Patient Active Problem List   Diagnosis Date Noted   Acute cystitis 12/25/2023   Rhonchi at both lung bases 01/20/2023   Acute cough 01/20/2023   Stress fracture of metatarsal bone 12/19/2022   Adjustment insomnia 06/15/2022   Vaginal vault prolapse after hysterectomy 05/10/2022   Ovarian mass, left 11/05/2021   History of Clostridioides difficile colitis 11/03/2021   C. difficile diarrhea 10/20/2021   Thyroid disease    S/P bilateral breast implants 01/17/2020   Muscle spasm 03/18/2019   Acute stress reaction 03/18/2019   Stress at work 08/07/2018   Grief reaction with prolonged bereavement 08/02/2018   Elevated LDL cholesterol level 03/07/2018   ADD (attention deficit disorder) 03/05/2018   Memory changes 03/05/2018   Word finding difficulty 03/05/2018   Varicose vein of leg 06/17/2017   Primary insomnia  06/17/2017   Cervical herniated disc 06/17/2017   Recurrent cold sores 01/20/2016   Osteopenia 08/16/2015   Food intolerance 08/29/2014   Adult ADHD 08/25/2014   Depression 08/25/2014   Vitamin D deficiency 08/25/2014   Dyslexia 08/25/2014    Past Surgical History:  Procedure Laterality Date   ABDOMINAL HYSTERECTOMY  2003   bleeding issues, in [place]   AUGMENTATION MAMMAPLASTY Bilateral    BLADDER SUSPENSION     BLADDER SUSPENSION N/A 10/03/2022   Procedure: TRANSVAGINAL TAPE (TVT) PROCEDURE;  Surgeon: Marguerita Beards, MD;  Location: [facility];  Service: Gynecology;  Laterality: N/A;   COLONOSCOPY     2008 [facility]   CYSTOSCOPY N/A 05/10/2022   Procedure: CYSTOSCOPY;  Surgeon: Marguerita Beards, MD;  Location: [facility];  Service: Gynecology;  Laterality: N/A;   CYSTOSCOPY N/A 10/03/2022   Procedure: CYSTOSCOPY;  Surgeon: Marguerita Beards, MD;  Location: [facility];  Service: Gynecology;  Laterality: N/A;   LAPAROTOMY     LEEP     ROBOTIC ASSISTED LAPAROSCOPIC SACROCOLPOPEXY N/A 05/10/2022   Procedure: XI ROBOTIC ASSISTED LAPAROSCOPIC SACROCOLPOPEXY;  Surgeon: Marguerita Beards, MD;  Location: [facility];  Service: Gynecology;  Laterality: N/A;   ROBOTIC ASSISTED SALPINGO OOPHERECTOMY Bilateral 05/10/2022   Procedure: XI ROBOTIC ASSISTED SALPINGO OOPHORECTOMY AND LYSIS OF ADHESIONS;  Surgeon: Marguerita Beards, MD;  [facility]:   [facility];  Service: Gynecology;  Laterality: Bilateral;  Total time requested is 3.5 hours    OB History     Gravida  3   Para  3   Term  3   Preterm      AB      Living  3      SAB      IAB      Ectopic      Multiple      Live Births  3            Home Medications    Prior to Admission medications   Medication Sig Start Date End Date Taking? Authorizing Provider  nitrofurantoin,  macrocrystal-monohydrate, (MACROBID) 100 MG capsule Take 1 capsule (100 mg total) by mouth 2 (two) times daily for 7 days. 01/06/24 01/13/24 Yes Trevor Iha, FNP  acetaminophen (TYLENOL) 500 MG tablet Take 1 tablet (500 mg total) by mouth every 6 (six) hours as needed (pain). 09/08/22   Marguerita Beards, MD  albuterol (VENTOLIN HFA) 108 (90 Base) MCG/ACT inhaler TAKE 2 PUFFS BY MOUTH EVERY 6 HOURS AS NEEDED FOR WHEEZE OR SHORTNESS OF BREATH 02/15/23   Breeback, Jade L, PA-C  ALPRAZolam (XANAX) 0.5 MG tablet Take 1 tablet (0.5 mg total) by mouth at bedtime as needed for anxiety. Patient taking differently: Take 0.5 mg by mouth at bedtime as needed for anxiety. 06/15/22   Breeback, Jade L, PA-C  amphetamine-dextroamphetamine (ADDERALL XR) 15 MG 24 hr capsule Take 1 capsule by mouth every morning. 08/10/23   Agapito Games, MD  amphetamine-dextroamphetamine (ADDERALL XR) 20 MG 24 hr capsule Take 1 capsule (20 mg total) by mouth every morning. 08/29/23   Breeback, Jade L, PA-C  amphetamine-dextroamphetamine (ADDERALL XR) 20 MG 24 hr capsule Take 1 capsule (20 mg total) by mouth every morning. 09/28/23   Breeback, Lonna Cobb, PA-C  amphetamine-dextroamphetamine (ADDERALL XR) 20 MG 24 hr capsule Take 1 capsule (20 mg total) by mouth every morning. 10/28/23   Breeback, Lonna Cobb, PA-C  cyclobenzaprine (FLEXERIL) 10 MG tablet Take 1 tablet (10 mg total) by mouth 3 (three) times daily as needed for muscle spasms. 07/29/22   Marguerita Beards, MD  dicyclomine (BENTYL) 10 MG capsule Take 1 capsule (10 mg total) by mouth 4 (four) times daily -  before meals and at bedtime. 01/25/23   Breeback, Lonna Cobb, PA-C  estradiol (ESTRACE) 0.1 MG/GM vaginal cream Place 0.5 g vaginally 2 (two) times a week. Place 0.5g nightly for two weeks then twice a week after 12/25/23   Monica Becton, MD  FLUoxetine (PROZAC) 10 MG capsule TAKE 1 CAPSULE (10 MG TOTAL) BY MOUTH DAILY. TO TAKE WITH 20MG  CAPSULE. 11/22/23   Breeback,  Jade L, PA-C  FLUoxetine (PROZAC) 20 MG capsule Take 1 capsule (20 mg total) by mouth daily. 01/03/23   Breeback, Jade L, PA-C  fluticasone (FLONASE) 50 MCG/ACT nasal spray SPRAY 2 SPRAYS INTO EACH NOSTRIL EVERY DAY 01/25/23   Breeback, Jade L, PA-C  ibuprofen (ADVIL) 600 MG tablet Take 1 tablet (600 mg total) by mouth every 6 (six) hours as needed. 09/08/22   Marguerita Beards, MD  Multiple Vitamins-Minerals (MULTIVITAMIN WOMEN) TABS Take by mouth 4 (four) times a week.    [provider]  Probiotic Product (PROBIOTIC DAILY PO) Take by mouth at bedtime.    [provider]  valACYclovir (VALTREX) 1000 MG tablet Take 2 tablets (2,000 mg total) by mouth 2 (two) times  daily. For cold sore Patient taking differently: Take 2,000 mg by mouth 2 (two) times daily as needed. For cold sore 06/15/22   Breeback, Jade L, PA-C  Vibegron (GEMTESA) 75 MG TABS Take 1 tablet (75 mg total) by mouth daily. 12/19/23   Marguerita Beards, MD  Vitamin D, Ergocalciferol, (DRISDOL) 1.25 MG (50000 UNIT) CAPS capsule Take 1 capsule (50,000 Units total) by mouth every 7 (seven) days. 01/03/23   Jomarie Longs, PA-C    Family History Family History  Problem Relation Age of Onset   Thyroid disease Mother    Cancer Mother    Osteopenia Mother    Uterine cancer Maternal Grandmother    Breast cancer Maternal Grandmother    Alcoholism Other        granparents   Depression Other        aunts   Colon cancer Neg Hx    Esophageal cancer Neg Hx     Social History Social History   Tobacco Use   Smoking status: Former    Current packs/day: 0.00    Types: Cigarettes    Quit date: 08/25/2004    Years since quitting: 19.3    Passive exposure: Never   Smokeless tobacco: Never  Vaping Use   Vaping status: Never Used  Substance Use Topics   Alcohol use: Yes    Comment: occasionally   Drug use: No     Allergies   E-mycin [erythromycin], Lexapro [escitalopram oxalate], Augmentin [amoxicillin-pot  clavulanate], Lactose intolerance (gi), Covid-19 mrna vaccine (pfizer) [covid-19 mrna vacc (moderna)], and Gadolinium   Review of Systems Review of Systems   Physical Exam Triage Vital Signs ED Triage Vitals [01/06/24 1552]  Encounter Vitals Group     BP 135/89     Systolic BP Percentile      Diastolic BP Percentile      Pulse      Resp 16     Temp 98.6 F (37 C)     Temp src      SpO2 98 %     Weight      Height      Head Circumference      Peak Flow      Pain Score 4     Pain Loc      Pain Education      Exclude from Growth Chart    No data found.  Updated Vital Signs BP 135/89   Temp 98.6 F (37 C)   Resp 16   SpO2 98%    Physical Exam Vitals and nursing note reviewed.  Constitutional:      Appearance: Normal appearance. She is normal weight.  HENT:     Head: Normocephalic and atraumatic.     Mouth/Throat:     Mouth: Mucous membranes are moist.     Pharynx: Oropharynx is clear.  Eyes:     Extraocular Movements: Extraocular movements intact.     Conjunctiva/sclera: Conjunctivae normal.     Pupils: Pupils are equal, round, and reactive to light.  Cardiovascular:     Rate and Rhythm: Normal rate and regular rhythm.     Pulses: Normal pulses.     Heart sounds: Normal heart sounds.  Pulmonary:     Effort: Pulmonary effort is normal.     Breath sounds: Normal breath sounds. No wheezing, rhonchi or rales.  Abdominal:     Tenderness: There is no right CVA tenderness or left CVA tenderness.  Musculoskeletal:  General: Normal range of motion.     Cervical back: Normal range of motion and neck supple.  Skin:    General: Skin is warm and dry.  Neurological:     General: No focal deficit present.     Mental Status: She is alert and oriented to person, place, and time. Mental status is at baseline.  Psychiatric:        Mood and Affect: Mood normal.        Behavior: Behavior normal.      UC Treatments / Results  Labs (all labs ordered are  listed, but only abnormal results are displayed) Labs Reviewed  POCT URINALYSIS DIP (MANUAL ENTRY) - Abnormal; Notable for the following components:      Result Value   Clarity, UA cloudy (*)    Blood, UA large (*)    Urobilinogen, UA 0.2 (*)    Leukocytes, UA Moderate (2+) (*)    All other components within normal limits  URINE CULTURE    EKG   Radiology No results found.  Procedures Procedures (including critical care time)  Medications Ordered in UC Medications - No data to display  Initial Impression / Assessment and Plan / UC Course  I have reviewed the triage vital signs and the nursing notes.  Pertinent labs & imaging results that were available during my care of the patient were reviewed by me and considered in my medical decision making (see chart for details).     MDM: 1.  Acute cystitis with hematuria-UA revealed above, Rx'd Macrobid 100 mg capsule: Take 1 capsule twice daily x 7 days. Advised patient to take medication as directed with food to completion.  Encouraged increase daily water intake to 64 ounces per day while taking this medication.  Advised we will follow-up with urine culture results once received.  Advised if symptoms worsen and/or unresolved please follow-up with your PCP or [facility] health urology for further evaluation.  Patient discharged home, hemodynamically stable. Final Clinical Impressions(s) / UC Diagnoses   Final diagnoses:  Acute cystitis with hematuria  Dysuria     Discharge Instructions      Advised patient to take medication as directed with food to completion.  Encouraged increase daily water intake to 64 ounces per day while taking this medication.  Advised we will follow-up with urine culture results once received.  Advised if symptoms worsen and/or unresolved please follow-up with your PCP or [facility] health urology for further evaluation.     ED Prescriptions     Medication Sig Dispense Auth. Provider   nitrofurantoin,  macrocrystal-monohydrate, (MACROBID) 100 MG capsule Take 1 capsule (100 mg total) by mouth 2 (two) times daily for 7 days. 14 capsule Trevor Iha, FNP      PDMP not reviewed this encounter.   Trevor Iha, FNP 01/06/24 [phone]

## 2024-01-06 NOTE — Discharge Instructions (Addendum)
 Advised patient to take medication as directed with food to completion.  Encouraged increase daily water intake to 64 ounces per day while taking this medication.  Advised we will follow-up with urine culture results once received.  Advised if symptoms worsen and/or unresolved please follow-up with your PCP or [facility] health urology for further evaluation.

## 2024-01-06 NOTE — ED Triage Notes (Signed)
 Pt presents to uc with co of dysuria and hematruia starting today. Pt reports she has recent hx of uti and finished antibiotic on 3/3

## 2024-01-08 LAB — URINE CULTURE: Culture: NO GROWTH

## 2024-01-12 ENCOUNTER — Ambulatory Visit: Admitting: Medical-Surgical

## 2024-01-17 ENCOUNTER — Ambulatory Visit: Admitting: Physician Assistant

## 2024-01-19 ENCOUNTER — Encounter: Payer: Self-pay | Admitting: Physician Assistant

## 2024-01-19 ENCOUNTER — Telehealth: Payer: Self-pay

## 2024-01-19 ENCOUNTER — Ambulatory Visit: Admitting: Physician Assistant

## 2024-01-19 VITALS — BP 123/83 | HR 77 | Ht 68.0 in | Wt 158.5 lb

## 2024-01-19 DIAGNOSIS — F909 Attention-deficit hyperactivity disorder, unspecified type: Secondary | ICD-10-CM | POA: Diagnosis not present

## 2024-01-19 DIAGNOSIS — Z23 Encounter for immunization: Secondary | ICD-10-CM

## 2024-01-19 DIAGNOSIS — F430 Acute stress reaction: Secondary | ICD-10-CM

## 2024-01-19 DIAGNOSIS — F331 Major depressive disorder, recurrent, moderate: Secondary | ICD-10-CM

## 2024-01-19 DIAGNOSIS — F4329 Adjustment disorder with other symptoms: Secondary | ICD-10-CM

## 2024-01-19 DIAGNOSIS — H9202 Otalgia, left ear: Secondary | ICD-10-CM

## 2024-01-19 DIAGNOSIS — R300 Dysuria: Secondary | ICD-10-CM | POA: Diagnosis not present

## 2024-01-19 DIAGNOSIS — Z1211 Encounter for screening for malignant neoplasm of colon: Secondary | ICD-10-CM

## 2024-01-19 DIAGNOSIS — Z1231 Encounter for screening mammogram for malignant neoplasm of breast: Secondary | ICD-10-CM

## 2024-01-19 LAB — POCT URINALYSIS DIP (CLINITEK)
Bilirubin, UA: NEGATIVE
Blood, UA: NEGATIVE
Glucose, UA: NEGATIVE mg/dL
Ketones, POC UA: NEGATIVE mg/dL
Nitrite, UA: NEGATIVE
POC PROTEIN,UA: NEGATIVE
Spec Grav, UA: 1.01 (ref 1.010–1.025)
Urobilinogen, UA: 0.2 U/dL
pH, UA: 6 (ref 5.0–8.0)

## 2024-01-19 MED ORDER — AMPHETAMINE-DEXTROAMPHET ER 20 MG PO CP24: 20.0000 mg | ORAL_CAPSULE | ORAL | 0 refills | Status: DC

## 2024-01-19 MED ORDER — METHYLPREDNISOLONE 4 MG PO TBPK: ORAL_TABLET | ORAL | 0 refills | Status: DC

## 2024-01-19 MED ORDER — FLUOXETINE HCL 20 MG PO CAPS: 20.0000 mg | ORAL_CAPSULE | Freq: Every day | ORAL | 3 refills | Status: DC

## 2024-01-19 MED ORDER — ALPRAZOLAM 0.5 MG PO TABS: 0.5000 mg | ORAL_TABLET | Freq: Every evening | ORAL | 2 refills | Status: DC | PRN

## 2024-01-19 MED ORDER — FLUOXETINE HCL 10 MG PO CAPS: 10.0000 mg | ORAL_CAPSULE | Freq: Every day | ORAL | 3 refills | Status: DC

## 2024-01-19 NOTE — Telephone Encounter (Signed)
 Prior auth for: ADDERALL Determination: Pending as of 01/19/24 Auth #: [id number] Valid from: n/a

## 2024-01-19 NOTE — Progress Notes (Signed)
 Established Patient Office Visit  Subjective   Patient ID: Jacqueline Hughes, female    DOB: Dec 26, 1965  Age: 58 y.o. MRN: [id number]  Chief Complaint  Patient presents with   Hospitalization Follow-up    Urgent care f/u uti    HPI Pt is a 58 yo female who presents to the clinic to follow up on UTI. She was first seen on 2/24 and given macrobid bid for UTI. Symptoms completely resolved and then came back 2 weeks later. She went to UC on 3/8 and given macrobid again. Symptoms have resolved but she is concerned that infection could still be there. No fever, chills, flank or abdominal pain. Hx of C.diff after augmentin and worried about taking any abxs.   Needs refills of adderall. Needs PA per patient. No concerns. Focus and mood are good.   She is having some left ear pressure and pain. No fever, chills,sinus pressure. Not trying anything to make better.   .. Active Ambulatory Problems    Diagnosis Date Noted   Adult ADHD 08/25/2014   Depression 08/25/2014   Vitamin D deficiency 08/25/2014   Dyslexia 08/25/2014   Food intolerance 08/29/2014   Osteopenia 08/16/2015   Recurrent cold sores 01/20/2016   Varicose vein of leg 06/17/2017   Primary insomnia 06/17/2017   Cervical herniated disc 06/17/2017   ADD (attention deficit disorder) 03/05/2018   Memory changes 03/05/2018   Word finding difficulty 03/05/2018   Elevated LDL cholesterol level 03/07/2018   Grief reaction with prolonged bereavement 08/02/2018   Stress at work 08/07/2018   Muscle spasm 03/18/2019   Acute stress reaction 03/18/2019   S/P bilateral breast implants 01/17/2020   C. difficile diarrhea 10/20/2021   Thyroid disease    History of Clostridioides difficile colitis 11/03/2021   Ovarian mass, left 11/05/2021   Vaginal vault prolapse after hysterectomy 05/10/2022   Adjustment insomnia 06/15/2022   Stress fracture of metatarsal bone 12/19/2022   Rhonchi at both lung bases 01/20/2023   Acute cough 01/20/2023    Acute cystitis 12/25/2023   Resolved Ambulatory Problems    Diagnosis Date Noted   Depression, major, single episode, moderate (HCC) 08/02/2018   Immunization reaction 01/01/2020   Itching 01/17/2020   Rash 01/17/2020   Flushing 01/17/2020   Periumbilical pain 06/03/2020   Closed fracture of sesamoid bone of left foot 08/14/2020   Moderate episode of recurrent major depressive disorder (HCC) 03/10/2021   Dehydration 10/18/2021   Diarrhea 10/18/2021   Acute maxillary sinusitis 10/18/2021   Pancolitis (HCC) 10/20/2021   Sepsis (HCC) 10/20/2021   Leukocytosis 10/20/2021   Acute hyponatremia 10/20/2021   Vaginal candidiasis 10/22/2021   Anal itching 11/05/2021   Acute bilateral low back pain without sciatica 11/05/2021   Hypomagnesemia 11/05/2021   Hypokalemia 11/05/2021   Past Medical History:  Diagnosis Date   ADHD (attention deficit hyperactivity disorder)    Arthritis    Family history of adverse reaction to anesthesia    GAD (generalized anxiety disorder)    History of abnormal cervical Pap smear    History of asthma    History of Clostridium difficile colitis 10/19/2021   History of hypothyroidism    Hyperlipidemia    OAB (overactive bladder)    PONV (postoperative nausea and vomiting)    Urethral caruncle    Wears glasses      Review of Systems  All other systems reviewed and are negative.     Objective:     BP 123/83 (BP Location: Left Arm,  Patient Position: Sitting, Cuff Size: Normal)   Pulse 77   Ht 5\' 8"  (1.727 m)   Wt 158 lb 8 oz (71.9 kg)   SpO2 95%   BMI 24.10 kg/m  BP Readings from Last 3 Encounters:  01/19/24 123/83  01/06/24 135/89  08/29/23 117/83   Wt Readings from Last 3 Encounters:  01/19/24 158 lb 8 oz (71.9 kg)  08/29/23 145 lb (65.8 kg)  01/20/23 150 lb 14.4 oz (68.4 kg)      Physical Exam Constitutional:      Appearance: Normal appearance.  HENT:     Head: Normocephalic.     Right Ear: Tympanic membrane, ear canal and  external ear normal. There is no impacted cerumen.     Left Ear: Tympanic membrane, ear canal and external ear normal. There is no impacted cerumen.     Nose: Nose normal.     Mouth/Throat:     Mouth: Mucous membranes are moist.     Pharynx: No oropharyngeal exudate or posterior oropharyngeal erythema.  Eyes:     General:        Right eye: No discharge.        Left eye: No discharge.     Pupils: Pupils are equal, round, and reactive to light.  Cardiovascular:     Rate and Rhythm: Normal rate and regular rhythm.  Pulmonary:     Effort: Pulmonary effort is normal.     Breath sounds: Normal breath sounds.  Musculoskeletal:     Cervical back: Normal range of motion and neck supple.     Right lower leg: No edema.     Left lower leg: No edema.  Neurological:     General: No focal deficit present.     Mental Status: She is alert and oriented to person, place, and time.  Psychiatric:        Mood and Affect: Mood normal.      Results for orders placed or performed in visit on 01/19/24  POCT URINALYSIS DIP (CLINITEK)  Result Value Ref Range   Color, UA yellow yellow   Clarity, UA clear clear   Glucose, UA negative negative mg/dL   Bilirubin, UA negative negative   Ketones, POC UA negative negative mg/dL   Spec Grav, UA 2.956 2.130 - 1.025   Blood, UA negative negative   pH, UA 6.0 5.0 - 8.0   POC PROTEIN,UA negative negative, trace   Urobilinogen, UA 0.2 0.2 or 1.0 E.U./dL   Nitrite, UA Negative Negative   Leukocytes, UA Trace (A) Negative   ..    01/19/2024    3:40 PM 08/29/2023    3:35 PM 08/29/2023    3:15 PM 01/03/2023    8:31 AM 06/15/2022    8:08 AM  Depression screen PHQ 2/9  Decreased Interest 0 1 1 0 0  Down, Depressed, Hopeless 0 2 2 0 0  PHQ - 2 Score 0 3 3 0 0  Altered sleeping 0 3  1   Tired, decreased energy 0 2  1   Change in appetite 0 1  0   Feeling bad or failure about yourself  0 0  0   Trouble concentrating 0 2  2   Moving slowly or fidgety/restless  0 1  0   Suicidal thoughts 0 0  0   PHQ-9 Score 0 12  4   Difficult doing work/chores Not difficult at all   Somewhat difficult        The 10-year  ASCVD risk score (Arnett DK, et al., 2019) is: 2%    Assessment & Plan:  Marland KitchenMarland KitchenFaith was seen today for hospitalization follow-up.  Diagnoses and all orders for this visit:  Dysuria -     POCT URINALYSIS DIP (CLINITEK) -     Urine Culture  Left ear pain -     methylPREDNISolone (MEDROL DOSEPAK) 4 MG TBPK tablet; Take as directed by package insert.  Acute stress reaction -     ALPRAZolam (XANAX) 0.5 MG tablet; Take 1 tablet (0.5 mg total) by mouth at bedtime as needed for anxiety.  Adult ADHD -     amphetamine-dextroamphetamine (ADDERALL XR) 20 MG 24 hr capsule; Take 1 capsule (20 mg total) by mouth every morning. -     amphetamine-dextroamphetamine (ADDERALL XR) 20 MG 24 hr capsule; Take 1 capsule (20 mg total) by mouth every morning. -     amphetamine-dextroamphetamine (ADDERALL XR) 20 MG 24 hr capsule; Take 1 capsule (20 mg total) by mouth every morning.  Moderate episode of recurrent major depressive disorder (HCC) -     FLUoxetine (PROZAC) 10 MG capsule; Take 1 capsule (10 mg total) by mouth daily. To take with 20mg  capsule. -     FLUoxetine (PROZAC) 20 MG capsule; Take 1 capsule (20 mg total) by mouth daily. To take with 10mg .  Grief reaction with prolonged bereavement -     FLUoxetine (PROZAC) 20 MG capsule; Take 1 capsule (20 mg total) by mouth daily. To take with 10mg .  Encounter for immunization -     Varicella-zoster vaccine IM  Visit for screening mammogram -     MM 3D SCREENING MAMMOGRAM BILATERAL BREAST; Future  Colon cancer screening -     Ambulatory referral to Gastroenterology   Referral for colonoscopy and mammogram done.   PHQ no concerns Prozac refilled Adderall refilled, per patient Adderall needs PA.  Xanax as needed  UA small leuks Culture ordered will send abx if needed only Hx of C.diff  Stay  hydrated  No signs of ear infection  Likely ETD HO given Start flonase and decongestant  If not improving start medrol dose pack      Tandy Gaw, PA-C

## 2024-01-19 NOTE — Patient Instructions (Addendum)
 For ear pain try decongestant with flonase and if not improving then medrol dose pack  Will culture urine only trace white blood cells which can be normal variant   Will order screenings

## 2024-01-21 ENCOUNTER — Encounter: Payer: Self-pay | Admitting: Physician Assistant

## 2024-01-22 ENCOUNTER — Encounter: Payer: Self-pay | Admitting: Physician Assistant

## 2024-01-22 DIAGNOSIS — B001 Herpesviral vesicular dermatitis: Secondary | ICD-10-CM

## 2024-01-22 LAB — URINE CULTURE

## 2024-01-22 NOTE — Progress Notes (Signed)
 GREAT news. Confirmed no significant bacteria to treat!

## 2024-01-30 ENCOUNTER — Telehealth: Payer: Self-pay

## 2024-01-30 ENCOUNTER — Other Ambulatory Visit (HOSPITAL_COMMUNITY): Payer: Self-pay

## 2024-01-30 NOTE — Telephone Encounter (Signed)
 Pharmacy Patient Advocate Encounter   Received notification from Physician's Office that prior authorization for Amphetamine-Dextroamphet ER 20MG  er capsules is required/requested.   Insurance verification completed.   The patient is insured through [facility] .   Per test claim: PA required; PA started via CoverMyMeds. KEY [id number] . Waiting for clinical questions to populate.

## 2024-01-30 NOTE — Progress Notes (Signed)
 PA request has been Started. New Encounter has been or will be created for follow up. For additional info see Pharmacy Prior Auth telephone encounter from 01/30/2024.

## 2024-02-02 ENCOUNTER — Other Ambulatory Visit (HOSPITAL_COMMUNITY): Payer: Self-pay

## 2024-02-02 NOTE — Telephone Encounter (Signed)
 Clinical Questions answered and PA submitted.

## 2024-02-02 NOTE — Telephone Encounter (Signed)
 Pharmacy Patient Advocate Encounter  Received notification from [facility] that Prior Authorization for Adderall ER 20 has been APPROVED from 02/02/24 to 02/02/27. Unable to obtain price due to refill too soon rejection, last fill date 01/27/24 next available fill date4/22/25   PA #/Case ID/Reference #: [id number]

## 2024-02-11 ENCOUNTER — Encounter: Payer: Self-pay | Admitting: Physician Assistant

## 2024-02-12 MED ORDER — VALACYCLOVIR HCL 1 G PO TABS: 2000.0000 mg | ORAL_TABLET | Freq: Two times a day (BID) | ORAL | 1 refills | Status: DC | PRN

## 2024-02-12 NOTE — Telephone Encounter (Signed)
 See other MyChart message

## 2024-02-13 ENCOUNTER — Ambulatory Visit: Payer: Self-pay

## 2024-02-13 NOTE — Telephone Encounter (Signed)
 Chief Complaint: UTI Symptoms: Burning with urination, blood in urine Frequency: this morning Pertinent Negatives: Patient denies fever, back pain Disposition: [] ED /[] Urgent Care (no appt availability in office) / [x] Appointment(In office/virtual)/ []  [place] [facility]/ [] Home Care/ [] Refused Recommended Disposition /[] [place] [facility]/ []  Follow-up with PCP Additional Notes: Patient called to request an appt for evaluation for UTI. Patient states this morning she noticed blood in her urine and has been experiencing burning with urination, along with the constant feeling of needing to urinate. Patient states she has had two UTIs in the last month. Patient has been off antibiotics and free of symptoms for two weeks, but the symptoms return this morning. Appt made for further evaluation.    Copied from CRM [id number]. Topic: Clinical - Red Word Triage >> Feb 13, 2024  1:44 PM Brynn Caras wrote: Red Word that prompted transfer to Nurse Triage: PT has a possible UTI, burning when urinating and blood in her urine. Reason for Disposition  Urinating more frequently than usual (i.e., frequency)  Answer Assessment - Initial Assessment Questions 1. SYMPTOM: "What's the main symptom you're concerned about?" (e.g., frequency, incontinence)     Pain with urination, blood in urine, constant urge to urinate 2. ONSET: "When did the urinary symptoms start?"     This morning 3. PAIN: "Is there any pain?" If Yes, ask: "How bad is it?" (Scale: 1-10; mild, moderate, severe)     varies 4. CAUSE: "What do you think is causing the symptoms?"     UTI 5. OTHER SYMPTOMS: "Do you have any other symptoms?" (e.g., blood in urine, fever, flank pain, pain with urination)     Blood urine, pain with urination, urge to urinate  Protocols used: Urinary Symptoms-A-AH

## 2024-02-14 ENCOUNTER — Encounter: Payer: Self-pay | Admitting: Physician Assistant

## 2024-02-14 ENCOUNTER — Ambulatory Visit: Admitting: Physician Assistant

## 2024-02-14 VITALS — BP 118/71 | HR 65 | Temp 98.5°F | Ht 68.0 in | Wt 157.0 lb

## 2024-02-14 DIAGNOSIS — R682 Dry mouth, unspecified: Secondary | ICD-10-CM

## 2024-02-14 DIAGNOSIS — R300 Dysuria: Secondary | ICD-10-CM

## 2024-02-14 DIAGNOSIS — L853 Xerosis cutis: Secondary | ICD-10-CM | POA: Diagnosis not present

## 2024-02-14 DIAGNOSIS — H04123 Dry eye syndrome of bilateral lacrimal glands: Secondary | ICD-10-CM | POA: Diagnosis not present

## 2024-02-14 LAB — POCT URINALYSIS DIP (CLINITEK)
Bilirubin, UA: NEGATIVE
Glucose, UA: NEGATIVE mg/dL
Ketones, POC UA: NEGATIVE mg/dL
Nitrite, UA: NEGATIVE
POC PROTEIN,UA: NEGATIVE
Spec Grav, UA: 1.015 (ref 1.010–1.025)
Urobilinogen, UA: 0.2 U/dL
pH, UA: 7.5 (ref 5.0–8.0)

## 2024-02-14 MED ORDER — PHENAZOPYRIDINE HCL 200 MG PO TABS: 200.0000 mg | ORAL_TABLET | Freq: Three times a day (TID) | ORAL | 0 refills | Status: AC

## 2024-02-14 NOTE — Telephone Encounter (Signed)
 FYI.  Pt was seen earlier today.

## 2024-02-14 NOTE — Patient Instructions (Signed)
 Estrogen cream on urethra as well twice a week

## 2024-02-14 NOTE — Progress Notes (Signed)
 Acute Office Visit  Subjective:     Patient ID: Jacqueline Hughes, female    DOB: 1966/02/24, 58 y.o.   MRN: [id number]  Chief Complaint  Patient presents with   Dysuria    Dysuria onset 1 day    HPI Patient is in today for dysuria for one day but concerned about it turning into a UTI. Her frequency has increased as well. No flank pain, nausea, vomiting, abdominal pain, fever, chills, body aches. She has never had problem with UTI's. Last UTI was February. She had some lingering symptoms but all other cultures were negative for bacteria. She is not having intercourse. She is using estrogen cream vaginally. She has urogyn appt late may.   History C.diff and concerned about antibiotics.   She would like labs because she feels so "dry" all the time. Her skin is dry. Her mouth is dry. She feels like her eyes are dry.     .. Active Ambulatory Problems    Diagnosis Date Noted   Adult ADHD 08/25/2014   Depression 08/25/2014   Vitamin D  deficiency 08/25/2014   Dyslexia 08/25/2014   Food intolerance 08/29/2014   Osteopenia 08/16/2015   Recurrent cold sores 01/20/2016   Varicose vein of leg 06/17/2017   Primary insomnia 06/17/2017   Cervical herniated disc 06/17/2017   ADD (attention deficit disorder) 03/05/2018   Memory changes 03/05/2018   Word finding difficulty 03/05/2018   Elevated LDL cholesterol level 03/07/2018   Grief reaction with prolonged bereavement 08/02/2018   Stress at work 08/07/2018   Muscle spasm 03/18/2019   Acute stress reaction 03/18/2019   S/P bilateral breast implants 01/17/2020   C. difficile diarrhea 10/20/2021   Thyroid disease    History of Clostridioides difficile colitis 11/03/2021   Ovarian mass, left 11/05/2021   Vaginal vault prolapse after hysterectomy 05/10/2022   Adjustment insomnia 06/15/2022   Stress fracture of metatarsal bone 12/19/2022   Rhonchi at both lung bases 01/20/2023   Acute cough 01/20/2023   Acute cystitis 12/25/2023   Dry  mouth 02/19/2024   Dry eyes 02/19/2024   Dry skin 02/19/2024   Dysuria 02/19/2024   Resolved Ambulatory Problems    Diagnosis Date Noted   Depression, major, single episode, moderate (HCC) 08/02/2018   Immunization reaction 01/01/2020   Itching 01/17/2020   Rash 01/17/2020   Flushing 01/17/2020   Periumbilical pain 06/03/2020   Closed fracture of sesamoid bone of left foot 08/14/2020   Moderate episode of recurrent major depressive disorder (HCC) 03/10/2021   Dehydration 10/18/2021   Diarrhea 10/18/2021   Acute maxillary sinusitis 10/18/2021   Pancolitis (HCC) 10/20/2021   Sepsis (HCC) 10/20/2021   Leukocytosis 10/20/2021   Acute hyponatremia 10/20/2021   Vaginal candidiasis 10/22/2021   Anal itching 11/05/2021   Acute bilateral low back pain without sciatica 11/05/2021   Hypomagnesemia 11/05/2021   Hypokalemia 11/05/2021   Past Medical History:  Diagnosis Date   ADHD (attention deficit hyperactivity disorder)    Arthritis    Family history of adverse reaction to anesthesia    GAD (generalized anxiety disorder)    History of abnormal cervical Pap smear    History of asthma    History of Clostridium difficile colitis 10/19/2021   History of hypothyroidism    Hyperlipidemia    OAB (overactive bladder)    PONV (postoperative nausea and vomiting)    Urethral caruncle    Wears glasses      ROS See HPI.      Objective:  BP 118/71   Pulse 65   Temp 98.5 F (36.9 C) (Oral)   Ht 5\' 8"  (1.727 m)   Wt 157 lb (71.2 kg)   SpO2 100%   BMI 23.87 kg/m  BP Readings from Last 3 Encounters:  02/14/24 118/71  01/19/24 123/83  01/06/24 135/89   Wt Readings from Last 3 Encounters:  02/14/24 157 lb (71.2 kg)  01/19/24 158 lb 8 oz (71.9 kg)  08/29/23 145 lb (65.8 kg)      Physical Exam Constitutional:      Appearance: Normal appearance.  HENT:     Head: Normocephalic.  Cardiovascular:     Rate and Rhythm: Normal rate.  Pulmonary:     Effort: Pulmonary effort  is normal.  Abdominal:     General: Bowel sounds are normal. There is no distension.     Palpations: Abdomen is soft. There is no mass.     Tenderness: There is no abdominal tenderness. There is no right CVA tenderness, left CVA tenderness, guarding or rebound.  Neurological:     General: No focal deficit present.     Mental Status: She is alert and oriented to person, place, and time.  Psychiatric:        Mood and Affect: Mood normal.           Assessment & Plan:  Jacqueline Hughes was seen today for dysuria.  Diagnoses and all orders for this visit:  Dysuria -     POCT URINALYSIS DIP (CLINITEK) -     Urine Culture -     phenazopyridine  (PYRIDIUM ) 200 MG tablet; Take 1 tablet (200 mg total) by mouth 3 (three) times daily for 2 days.  Dry mouth -     CMP14+EGFR -     CBC w/Diff/Platelet -     ANA,IFA RA Diag Pnl w/rflx Tit/Patn -     TSH + free T4  Dry eyes -     CMP14+EGFR -     CBC w/Diff/Platelet -     ANA,IFA RA Diag Pnl w/rflx Tit/Patn -     TSH + free T4  Dry skin -     CMP14+EGFR -     CBC w/Diff/Platelet -     ANA,IFA RA Diag Pnl w/rflx Tit/Patn -     TSH + free T4    UA showed moderate blood and leukocytes Will culture before starting antibiotic due to history of C.diff No red flag symptoms today Start pyridium  and increase hydration Will call and treat accordingly Start using estrogen cream over urethra twice a week for UTI prevention Keep app with urogyneocology in May  Will get labs to evaluate for dry symptoms   Jacqueline Crumb, PA-C

## 2024-02-17 LAB — URINE CULTURE

## 2024-02-19 ENCOUNTER — Encounter: Payer: Self-pay | Admitting: Physician Assistant

## 2024-02-19 ENCOUNTER — Telehealth: Payer: Self-pay

## 2024-02-19 DIAGNOSIS — R300 Dysuria: Secondary | ICD-10-CM | POA: Insufficient documentation

## 2024-02-19 DIAGNOSIS — L853 Xerosis cutis: Secondary | ICD-10-CM | POA: Insufficient documentation

## 2024-02-19 DIAGNOSIS — H04123 Dry eye syndrome of bilateral lacrimal glands: Secondary | ICD-10-CM | POA: Insufficient documentation

## 2024-02-19 DIAGNOSIS — R682 Dry mouth, unspecified: Secondary | ICD-10-CM | POA: Insufficient documentation

## 2024-02-19 MED ORDER — SULFAMETHOXAZOLE-TRIMETHOPRIM 800-160 MG PO TABS: 1.0000 | ORAL_TABLET | Freq: Two times a day (BID) | ORAL | 0 refills | Status: DC

## 2024-02-19 NOTE — Telephone Encounter (Signed)
 Patient returned call for lab results. She states she is still having symptoms of the UTI. She would like to start on the antibiotic. Pended out of state pharmacy.

## 2024-02-19 NOTE — Progress Notes (Signed)
 Jacqueline Hughes,   Calcium a little bit low. Make sure taking calcium daily 600mg  twice a day or 4 servings.   E.coli detected but at a very low colony count. 100,000 we treat with antibiotic. You are at 25,000 to 50,000. Are you still having symptoms?  If so will treat with bactrim  twice a day for 3 days if not having symptoms I would opt not to treat.  Make sure if you start antibiotic to also take with probiotics.   Thyroid looks good.  Still waiting for ANA.  White blood count normal.

## 2024-02-20 NOTE — Telephone Encounter (Signed)
 Left message advising patient of antibiotic.

## 2024-02-22 LAB — CMP14+EGFR
ALT: 14 IU/L (ref 0–32)
AST: 22 IU/L (ref 0–40)
Albumin: 4.2 g/dL (ref 3.8–4.9)
Alkaline Phosphatase: 65 IU/L (ref 44–121)
BUN/Creatinine Ratio: 16 (ref 9–23)
BUN: 10 mg/dL (ref 6–24)
Bilirubin Total: 0.4 mg/dL (ref 0.0–1.2)
CO2: 22 mmol/L (ref 20–29)
Calcium: 8.5 mg/dL — ABNORMAL LOW (ref 8.7–10.2)
Chloride: 102 mmol/L (ref 96–106)
Creatinine, Ser: 0.62 mg/dL (ref 0.57–1.00)
Globulin, Total: 2.1 g/dL (ref 1.5–4.5)
Glucose: 75 mg/dL (ref 70–99)
Potassium: 4.5 mmol/L (ref 3.5–5.2)
Sodium: 140 mmol/L (ref 134–144)
Total Protein: 6.3 g/dL (ref 6.0–8.5)
eGFR: 104 mL/min/{1.73_m2} (ref >59)

## 2024-02-22 LAB — CBC WITH DIFFERENTIAL/PLATELET
Basophils Absolute: 0.1 10*3/uL (ref 0.0–0.2)
Basos: 1 %
EOS (ABSOLUTE): 0.2 10*3/uL (ref 0.0–0.4)
Eos: 2 %
Hematocrit: 40.4 % (ref 34.0–46.6)
Hemoglobin: 13.4 g/dL (ref 11.1–15.9)
Immature Grans (Abs): 0 10*3/uL (ref 0.0–0.1)
Immature Granulocytes: 0 %
Lymphocytes Absolute: 1.4 10*3/uL (ref 0.7–3.1)
Lymphs: 19 %
MCH: 29.9 pg (ref 26.6–33.0)
MCHC: 33.2 g/dL (ref 31.5–35.7)
MCV: 90 fL (ref 79–97)
Monocytes Absolute: 0.6 10*3/uL (ref 0.1–0.9)
Monocytes: 8 %
Neutrophils Absolute: 5.2 10*3/uL (ref 1.4–7.0)
Neutrophils: 70 %
Platelets: 305 10*3/uL (ref 150–450)
RBC: 4.48 x10E6/uL (ref 3.77–5.28)
RDW: 12.7 % (ref 11.7–15.4)
WBC: 7.4 10*3/uL (ref 3.4–10.8)

## 2024-02-22 LAB — TSH+FREE T4
Free T4: 0.94 ng/dL (ref 0.82–1.77)
TSH: 1.54 u[IU]/mL (ref 0.450–4.500)

## 2024-02-22 LAB — ANA,IFA RA DIAG PNL W/RFLX TIT/PATN
Cyclic Citrullin Peptide Ab: 7 U (ref 0–19)
Rheumatoid fact SerPl-aCnc: <10 [IU]/mL (ref <14.0)

## 2024-02-23 ENCOUNTER — Ambulatory Visit: Payer: Self-pay

## 2024-02-23 NOTE — Progress Notes (Signed)
 All autoimmune testing is negative. Is she feeling better with UTI symptoms after antibiotic?

## 2024-02-23 NOTE — Telephone Encounter (Signed)
 Chief Complaint: Lump between breasts Symptoms: Redness, hot to touch, mild drainage Frequency: x 8 days Pertinent Negatives: Patient denies fever Disposition: [] ED /[x] Urgent Care (no appt availability in office) / [] Appointment(In office/virtual)/ []  [facility]/ [] Home Care/ [] Refused Recommended Disposition /[] [facility]/ []  Follow-up with PCP Additional Notes: Pt just completed abx for UTI yesterday, notes she developed a "cyst" like lump between her breasts, she reports it is red and hot to touch, she notes it opened and drained a "few days ago" but notes she has had no drainage since. Denies fever. Pt out of [place] on [place] and advised UC. Pt declines due to history of Hughes-Diff and concern for abx usage. Pt requests message sent to provider, she will call back Monday got appt when she returns home. This RN educated pt on home care, new-worsening symptoms, when to call back/seek emergent care. Pt verbalized understanding and agrees to plan.    Copied from CRM [id number]. Topic: Clinical - Red Word Triage >> Feb 23, 2024  4:35 PM Jacqueline Hughes wrote: Red Word that prompted transfer to Nurse Triage: Patient states she went on vacation and came back with a cyst like wound on her chest (in between her breast). Patient states the wound is hot to touch and is getting larger. She noticed it like 8 days ago. Reason for Disposition  [1] Swelling is painful to touch AND [2] no fever  Answer Assessment - Initial Assessment Questions 1. APPEARANCE of SWELLING: "What does it look like?"     "Cyst" between breasts 2. SIZE: "How large is the swelling?" (e.g., inches, cm; or compare to size of pinhead, tip of pen, eraser, coin, pea, grape, ping pong ball)      Egg 3. LOCATION: "Where is the swelling located?"     Between breasts 4. ONSET: "When did the swelling start?"     About x 3 days 5. COLOR: "What color is it?" "Is there more than one color?"     Red 6. PAIN: "Is there any  pain?" If Yes, ask: "How bad is the pain?" (e.g., scale 1-10; or mild, moderate, severe)     - NONE (0): no pain   - MILD (1-3): doesn't interfere with normal activities    - MODERATE (4-7): interferes with normal activities or awakens from sleep    - SEVERE (8-10): excruciating pain, unable to do any normal activities     6/10  9 OTHER SYMPTOMS: "Do you have any other symptoms?" (e.g., fever)     None  Protocols used: Skin Lump or Localized Swelling-A-AH

## 2024-03-22 ENCOUNTER — Ambulatory Visit: Admitting: Obstetrics and Gynecology

## 2024-04-17 ENCOUNTER — Ambulatory Visit

## 2024-04-17 ENCOUNTER — Ambulatory Visit (INDEPENDENT_AMBULATORY_CARE_PROVIDER_SITE_OTHER)

## 2024-04-17 ENCOUNTER — Ambulatory Visit: Payer: Self-pay | Admitting: Physician Assistant

## 2024-04-17 DIAGNOSIS — Z13820 Encounter for screening for osteoporosis: Secondary | ICD-10-CM

## 2024-04-17 DIAGNOSIS — M8580 Other specified disorders of bone density and structure, unspecified site: Secondary | ICD-10-CM

## 2024-04-17 NOTE — Progress Notes (Signed)
 No significant change from last bone density and continues to stay in osteopenia range. Continue with vitamin D  and calcium and low weight resistance exercise!

## 2024-04-26 ENCOUNTER — Ambulatory Visit: Admitting: Obstetrics and Gynecology

## 2024-04-26 ENCOUNTER — Encounter: Payer: Self-pay | Admitting: Obstetrics and Gynecology

## 2024-04-26 VITALS — BP 104/67 | HR 89

## 2024-04-26 DIAGNOSIS — N3281 Overactive bladder: Secondary | ICD-10-CM | POA: Insufficient documentation

## 2024-04-26 DIAGNOSIS — M62838 Other muscle spasm: Secondary | ICD-10-CM

## 2024-04-26 DIAGNOSIS — N390 Urinary tract infection, site not specified: Secondary | ICD-10-CM | POA: Insufficient documentation

## 2024-04-26 DIAGNOSIS — N952 Postmenopausal atrophic vaginitis: Secondary | ICD-10-CM | POA: Insufficient documentation

## 2024-04-26 DIAGNOSIS — N941 Unspecified dyspareunia: Secondary | ICD-10-CM | POA: Diagnosis not present

## 2024-04-26 MED ORDER — DIAZEPAM 5 MG PO TABS: ORAL_TABLET | ORAL | 1 refills | Status: DC

## 2024-04-26 NOTE — Assessment & Plan Note (Signed)
-   Restart Gemtesa  - Also reviewed options of PTNS, SNM and botox. Handouts provided for her to review.  - For refractory OAB we reviewed the procedure for intravesical Botox injection with cystoscopy in the office and reviewed the risks, benefits and alternatives of treatment including but not limited to infection, need for self-catheterization and need for repeat therapy.  We discussed that there is a 5-15% chance of needing to catheterize with Botox and that this usually resolves in a few months; however can persist for longer periods of time.  - Sacral neuromodulation requires a test phase, and documentation of bladder function via diary. After a successful test period, a permanent wire and generator are placed in the OR. The battery lasts 10-15 years on average and would need to be replaced surgically.  The goal of this therapy is at least a 50% improvement in symptoms. - We also discussed the role of percutaneous tibial nerve stimulation and how it works.  She understands it requires 12 weekly visits for temporary neuromodulation of the sacral nerve roots via the tibial nerve and that she may then require continued tapered treatment.

## 2024-04-26 NOTE — Assessment & Plan Note (Signed)
-   Likely due to increased pelvic floor muscle spasm and atrophy. No anatomic issues on exam. - Discussed perineal/ pelvic floor massage with vaginal dilators or wand. - Advised to use vaginal valium 5mg  as needed prior to pelvic floor work or intercourse.  - Try silicone based lubricant or lubricant with lidocaine  (samples provided) - Also discussed options of pelvic PT, but pt prefers to try on her own first

## 2024-04-26 NOTE — Assessment & Plan Note (Signed)
-   Increase vaginal estrace  cream to 1g three times a week - Also use vaginal moisturizer like coconut oil or vitamin E cream

## 2024-04-26 NOTE — Patient Instructions (Addendum)
 Increase vaginal estrogen to 1 g three times a week. Can use vaginal moisturizer as needed.  Vulvovaginal moisturizer Options: Vitamin E oil (pump or capsule) or cream (Gene's Vit E Cream) Coconut oil Silicone-based lubricant for use during intercourse (wet platinum is a brand available at most drugstores) Crisco Consider the ingredients of the product - the fewer the ingredients the better!  Directions for Use: Clean and dry your hands Gently dab the vulvar/vaginal area dry as needed Apply a "pea-sized" amount of the moisturizer onto your fingertip Using you other hand, open the labia  Apply the moisturizer to the vulvar/vaginal tissues Wear loose fitting underwear/clothing if possible following application Use moisturize up to 3 times daily as desired.   I will prescribe valium 5 mg pills to place vaginally up to 2 times a day for vaginal muscle spasms. Start at night and take one every night for the next several weeks to see if it improves your symptoms. Once you are improving you can taper off the medication and just use as needed. If the medication makes you drowsy then only use at bedtime and/or we can reduce the dose. Do not use gel to place it, as that will prevent the tablet from dissolving.  Instead place 1-2 drops of water  on the table before inserting it in the vagina. If the pills do not dissolve well we can switch to a special compounded suppository. Let me know how you are doing on the medication and if you have any questions.

## 2024-04-26 NOTE — Progress Notes (Signed)
  Urogynecology Return Visit  SUBJECTIVE  History of Present Illness: Jacqueline Hughes is a 58 y.o. female seen in follow-up for overactive bladder.   Surgery: s/p s/p midurethral sling, cystoscopy on 10/03/22. Also s/p urethral bulking on 08/15/22, and s/p Robotic assisted laparoscopic lysis of adhesions, bilateral salpingo-oophorectomy, sacrocolpopexy, cystoscopy on 05/10/22.   She is not able to have intercourse anymore. Feels that there is a wall a few centimeters in. This started about 6 months ago. She waited a few months and tried again and it was the same issue. Even when she inserts the estradiol  she feels she hits something. She feels her partner is average sized. Has been using lubricant.   Pt stopped Gemtesa  and then has not had any more UTIs. She has had a few UTIs in the last few months. She had symptoms of burning, urgency and hematuria. Cultures:  02/13/24- 25-50,000 E.Coli, pansensitive 01/19/24- mixed flora 01/06/24- no growth 12/25/23- 50-100,000 E.Coli  Was using vaginal estrogen up until about a month ago.   Past Medical History: Patient  has a past medical history of ADHD (attention deficit hyperactivity disorder), Arthritis, Depression, Family history of adverse reaction to anesthesia, GAD (generalized anxiety disorder), History of abnormal cervical Pap smear, History of asthma, History of Clostridium difficile colitis (10/19/2021), History of hypothyroidism, Hyperlipidemia, OAB (overactive bladder), Osteopenia, PONV (postoperative nausea and vomiting), Urethral caruncle, Vitamin D  deficiency, and Wears glasses.   Past Surgical History: She  has a past surgical history that includes Abdominal hysterectomy (2003); laparotomy; Augmentation mammaplasty (Bilateral); LEEP; Bladder suspension; Colonoscopy; Robotic assisted salpingo oophorectomy (Bilateral, 05/10/2022); Robotic assisted laparoscopic sacrocolpopexy (N/A, 05/10/2022); Cystoscopy (N/A, 05/10/2022); Bladder  suspension (N/A, 10/03/2022); and Cystoscopy (N/A, 10/03/2022).   Medications: She has a current medication list which includes the following prescription(s): acetaminophen , albuterol , alprazolam , amphetamine -dextroamphetamine , amphetamine -dextroamphetamine , amphetamine -dextroamphetamine , cyclobenzaprine , diazepam, dicyclomine , estradiol , fluoxetine , fluoxetine , fluticasone , ibuprofen , multivitamin women, probiotic product, semaglutide-weight management, sulfamethoxazole -trimethoprim , valacyclovir , gemtesa , and vitamin d  (ergocalciferol ).   Allergies: Patient is allergic to e-mycin [erythromycin], lexapro  [escitalopram  oxalate], augmentin  [amoxicillin -pot clavulanate], lactose intolerance (gi), covid-19 mrna vaccine (pfizer) [covid-19 mrna vacc (moderna)], and gadolinium.   Social History: Patient  reports that she quit smoking about 19 years ago. Her smoking use included cigarettes. She has never been exposed to tobacco smoke. She has never used smokeless tobacco. She reports current alcohol use. She reports that she does not use drugs.      OBJECTIVE     Physical Exam: Vitals:   04/26/24 1402  BP: 104/67  Pulse: 89    Gen: No apparent distress  Normal external genitalia with atrophy. On speculum, vaginal mucosa with atrophy, bleeding with speculum insertion. No visible or palpable mesh. Tenderness with palpation of pelvic floor muscles. On bimanual, no masses present.     ASSESSMENT AND PLAN    Jacqueline Hughes is a 58 y.o. with:  1. Dyspareunia, female   2. Levator spasm   3. Recurrent urinary tract infection   4. Vaginal atrophy   5. Overactive bladder    Dyspareunia, female Assessment & Plan: - Likely due to increased pelvic floor muscle spasm and atrophy. No anatomic issues on exam. - Discussed perineal/ pelvic floor massage with vaginal dilators or wand. - Advised to use vaginal valium 5mg  as needed prior to pelvic floor work or intercourse.  - Try silicone based lubricant or  lubricant with lidocaine  (samples provided) - Also discussed options of pelvic PT, but pt prefers to try on her own first   Levator spasm -  diazePAM; Place 1 tablet vaginally nightly as needed for muscle spasm/ pelvic pain.  Dispense: 30 tablet; Refill: 1  Recurrent urinary tract infection Assessment & Plan: - restart vaginal estrogen - For treatment of recurrent urinary tract infections, we discussed management of recurrent UTIs including prophylaxis with a daily low dose antibiotic, transvaginal estrogen therapy, D-mannose, and cranberry supplements.  - If she has another UTI with estrace  use, then will consider antibiotic ppx and a cystoscopy    Vaginal atrophy Assessment & Plan: - Increase vaginal estrace  cream to 1g three times a week - Also use vaginal moisturizer like coconut oil or vitamin E cream   Overactive bladder Assessment & Plan: - Restart Gemtesa  - Also reviewed options of PTNS, SNM and botox. Handouts provided for her to review.  - For refractory OAB we reviewed the procedure for intravesical Botox injection with cystoscopy in the office and reviewed the risks, benefits and alternatives of treatment including but not limited to infection, need for self-catheterization and need for repeat therapy.  We discussed that there is a 5-15% chance of needing to catheterize with Botox and that this usually resolves in a few months; however can persist for longer periods of time.  - Sacral neuromodulation requires a test phase, and documentation of bladder function via diary. After a successful test period, a permanent wire and generator are placed in the OR. The battery lasts 10-15 years on average and would need to be replaced surgically.  The goal of this therapy is at least a 50% improvement in symptoms. - We also discussed the role of percutaneous tibial nerve stimulation and how it works.  She understands it requires 12 weekly visits for temporary neuromodulation of the  sacral nerve roots via the tibial nerve and that she may then require continued tapered treatment.      Rosaline LOISE Caper, MD

## 2024-04-26 NOTE — Assessment & Plan Note (Signed)
-   restart vaginal estrogen - For treatment of recurrent urinary tract infections, we discussed management of recurrent UTIs including prophylaxis with a daily low dose antibiotic, transvaginal estrogen therapy, D-mannose, and cranberry supplements.  - If she has another UTI with estrace  use, then will consider antibiotic ppx and a cystoscopy

## 2024-05-02 ENCOUNTER — Ambulatory Visit

## 2024-06-06 ENCOUNTER — Ambulatory Visit

## 2024-06-21 ENCOUNTER — Ambulatory Visit: Admitting: Obstetrics and Gynecology

## 2024-07-02 ENCOUNTER — Encounter: Payer: Self-pay | Admitting: Sports Medicine

## 2024-08-21 ENCOUNTER — Ambulatory Visit: Admission: EM | Admit: 2024-08-21 | Discharge: 2024-08-21 | Disposition: A | Discharge status: Home / Self Care

## 2024-08-21 DIAGNOSIS — R079 Chest pain, unspecified: Secondary | ICD-10-CM

## 2024-08-21 NOTE — ED Triage Notes (Addendum)
 Pt presents to [facility] with co mid epigastric /chest pain squeezing and pressure since this morning that woke her up. Pt reports she took 4 tums and drank water  and throwing up bile. Pt reports she normally drinks coffee and skipped it this morning was able to eat lunch. Pt reports pain is now radiating to the back neck and left arm.

## 2024-08-21 NOTE — Discharge Instructions (Addendum)
 Advised patient EKG was within normal limits as well as physical exam.  Advised watchful waiting at this point.  Advised if symptoms worsen and/or unresolved please follow-up with your PCP or here for further evaluation.

## 2024-08-21 NOTE — ED Provider Notes (Signed)
 [facility]    CSN: [id number] Arrival date & time: 08/21/24  1657      History   Chief Complaint Chief Complaint  Patient presents with   Chest Pain   epigastic pain     HPI Jacqueline Hughes is a 58 y.o. female.   HPI 58 year old female presents with chest pain with squeezing pressure that woke her up this morning.  Patient reports that she ate for Tums and drank water  then threw up bile.  Patient reports normally drinks coffee however skipped it this morning and was able to eat lunch.  Patient reports pain will radiate to back of neck and left arm.  PMH significant for GAD, ADHD, and depression.  Past Medical History:  Diagnosis Date   ADHD (attention deficit hyperactivity disorder)    Arthritis    Depression    Family history of adverse reaction to anesthesia    mother--- ponv   GAD (generalized anxiety disorder)    History of abnormal cervical Pap smear    prior to hysterectomy, pap smears normal after hysterectomy   History of asthma    History of Clostridium difficile colitis 10/19/2021   take augmentin    History of hypothyroidism    Hyperlipidemia    OAB (overactive bladder)    Osteopenia    PONV (postoperative nausea and vomiting)    Urethral caruncle    Vitamin D  deficiency    Wears glasses     Patient Active Problem List   Diagnosis Date Noted   Dyspareunia, female 04/26/2024   Recurrent urinary tract infection 04/26/2024   Vaginal atrophy 04/26/2024   Overactive bladder 04/26/2024   Dry mouth 02/19/2024   Dry eyes 02/19/2024   Dry skin 02/19/2024   Dysuria 02/19/2024   Acute cystitis 12/25/2023   Rhonchi at both lung bases 01/20/2023   Acute cough 01/20/2023   Stress fracture of metatarsal bone 12/19/2022   Adjustment insomnia 06/15/2022   Vaginal vault prolapse after hysterectomy 05/10/2022   Ovarian mass, left 11/05/2021   History of Clostridioides difficile colitis 11/03/2021   C. difficile diarrhea 10/20/2021   Thyroid disease     S/P bilateral breast implants 01/17/2020   Muscle spasm 03/18/2019   Acute stress reaction 03/18/2019   Stress at work 08/07/2018   Grief reaction with prolonged bereavement 08/02/2018   Elevated LDL cholesterol level 03/07/2018   ADD (attention deficit disorder) 03/05/2018   Memory changes 03/05/2018   Word finding difficulty 03/05/2018   Varicose vein of leg 06/17/2017   Primary insomnia 06/17/2017   Cervical herniated disc 06/17/2017   Recurrent cold sores 01/20/2016   Osteopenia 08/16/2015   Food intolerance 08/29/2014   Adult ADHD 08/25/2014   Depression 08/25/2014   Vitamin D  deficiency 08/25/2014   Dyslexia 08/25/2014    Past Surgical History:  Procedure Laterality Date   ABDOMINAL HYSTERECTOMY  2003   bleeding issues, in [place]    AUGMENTATION MAMMAPLASTY Bilateral    BLADDER SUSPENSION     BLADDER SUSPENSION N/A 10/03/2022   Procedure: TRANSVAGINAL TAPE (TVT) PROCEDURE;  Surgeon: Marilynne Rosaline SAILOR, MD;  Location: [facility];  Service: Gynecology;  Laterality: N/A;   COLONOSCOPY     2008 [place]  [facility] hospital   CYSTOSCOPY N/A 05/10/2022   Procedure: CYSTOSCOPY;  Surgeon: Marilynne Rosaline SAILOR, MD;  Location: [facility];  Service: Gynecology;  Laterality: N/A;   CYSTOSCOPY N/A 10/03/2022   Procedure: CYSTOSCOPY;  Surgeon: Marilynne Rosaline SAILOR, MD;  Location: [facility];  Service: Gynecology;  Laterality: N/A;   LAPAROTOMY     LEEP     ROBOTIC ASSISTED LAPAROSCOPIC SACROCOLPOPEXY N/A 05/10/2022   Procedure: XI ROBOTIC ASSISTED LAPAROSCOPIC SACROCOLPOPEXY;  Surgeon: Marilynne Rosaline SAILOR, MD;  Location: [facility];  Service: Gynecology;  Laterality: N/A;   ROBOTIC ASSISTED SALPINGO OOPHERECTOMY Bilateral 05/10/2022   Procedure: XI ROBOTIC ASSISTED SALPINGO OOPHORECTOMY AND LYSIS OF ADHESIONS;  Surgeon: Marilynne Rosaline SAILOR, MD;  Location: [facility];  Service: Gynecology;   Laterality: Bilateral;  Total time requested is 3.5 hours    OB History     Gravida  3   Para  3   Term  3   Preterm      AB      Living  3      SAB      IAB      Ectopic      Multiple      Live Births  3            Home Medications    Prior to Admission medications   Medication Sig Start Date End Date Taking? Authorizing Provider  acetaminophen  (TYLENOL ) 500 MG tablet Take 1 tablet (500 mg total) by mouth every 6 (six) hours as needed (pain). 09/08/22   Marilynne Rosaline SAILOR, MD  albuterol  (VENTOLIN  HFA) 108 (90 Base) MCG/ACT inhaler TAKE 2 PUFFS BY MOUTH EVERY 6 HOURS AS NEEDED FOR WHEEZE OR SHORTNESS OF BREATH 02/15/23   Breeback, Jade L, PA-C  ALPRAZolam  (XANAX ) 0.5 MG tablet Take 1 tablet (0.5 mg total) by mouth at bedtime as needed for anxiety. 01/19/24   Breeback, Jade L, PA-C  amphetamine -dextroamphetamine  (ADDERALL  XR) 20 MG 24 hr capsule Take 1 capsule (20 mg total) by mouth every morning. 01/19/24   Breeback, Jade L, PA-C  amphetamine -dextroamphetamine  (ADDERALL  XR) 20 MG 24 hr capsule Take 1 capsule (20 mg total) by mouth every morning. 02/18/24   Breeback, Jade L, PA-C  amphetamine -dextroamphetamine  (ADDERALL  XR) 20 MG 24 hr capsule Take 1 capsule (20 mg total) by mouth every morning. 03/19/24   Breeback, Jade L, PA-C  cyclobenzaprine  (FLEXERIL ) 10 MG tablet Take 1 tablet (10 mg total) by mouth 3 (three) times daily as needed for muscle spasms. 07/29/22   Marilynne Rosaline SAILOR, MD  diazepam  (VALIUM ) 5 MG tablet Place 1 tablet vaginally nightly as needed for muscle spasm/ pelvic pain. 04/26/24   Marilynne Rosaline SAILOR, MD  dicyclomine  (BENTYL ) 10 MG capsule Take 1 capsule (10 mg total) by mouth 4 (four) times daily -  before meals and at bedtime. 01/25/23   Breeback, Jade L, PA-C  estradiol  (ESTRACE ) 0.1 MG/GM vaginal cream Place 0.5 g vaginally 2 (two) times a week. Place 0.5g nightly for two weeks then twice a week after 12/25/23   Curtis Debby PARAS, MD   FLUoxetine  (PROZAC ) 10 MG capsule Take 1 capsule (10 mg total) by mouth daily. To take with 20mg  capsule. 01/19/24   Breeback, Jade L, PA-C  FLUoxetine  (PROZAC ) 20 MG capsule Take 1 capsule (20 mg total) by mouth daily. To take with 10mg . 01/19/24   Breeback, Jade L, PA-C  fluticasone  (FLONASE ) 50 MCG/ACT nasal spray SPRAY 2 SPRAYS INTO EACH NOSTRIL EVERY DAY 01/25/23   Breeback, Jade L, PA-C  ibuprofen  (ADVIL ) 600 MG tablet Take 1 tablet (600 mg total) by mouth every 6 (six) hours as needed. 09/08/22   Marilynne Rosaline SAILOR, MD  Multiple Vitamins-Minerals (MULTIVITAMIN WOMEN) TABS Take by mouth 4 (four) times a  week.    [provider]  Probiotic Product (PROBIOTIC DAILY PO) Take by mouth at bedtime.    [provider]  Semaglutide-Weight Management 0.5 MG/0.5ML SOAJ Week 1-4 Inj 0.1ml (10 Units on syringe) subcutaneously once a week Week 5-8 Inj 0.2ml (20 Units) subcutaneously once a week KEEP REFRIGERATED 03/21/24   [provider]  sulfamethoxazole -trimethoprim  (BACTRIM  DS) 800-160 MG tablet Take 1 tablet by mouth 2 (two) times daily. 02/19/24   Breeback, Jade L, PA-C  valACYclovir  (VALTREX ) 1000 MG tablet Take 2 tablets (2,000 mg total) by mouth 2 (two) times daily as needed. For cold sore 02/12/24   Antoniette Rasher L, PA-C  Vibegron  (GEMTESA ) 75 MG TABS Take 1 tablet (75 mg total) by mouth daily. 12/19/23   Marilynne Rosaline SAILOR, MD  Vitamin D , Ergocalciferol , (DRISDOL ) 1.25 MG (50000 UNIT) CAPS capsule Take 1 capsule (50,000 Units total) by mouth every 7 (seven) days. 01/03/23   Antoniette Rasher CROME, PA-C    Family History Family History  Problem Relation Age of Onset   Thyroid disease Mother    Cancer Mother    Osteopenia Mother    Uterine cancer Maternal Grandmother    Breast cancer Maternal Grandmother    Alcoholism Other        granparents   Depression Other        aunts   Colon cancer Neg Hx    Esophageal cancer Neg Hx     Social History Social History    Tobacco Use   Smoking status: Former    Current packs/day: 0.00    Types: Cigarettes    Quit date: 08/25/2004    Years since quitting: 20.0    Passive exposure: Never   Smokeless tobacco: Never  Vaping Use   Vaping status: Never Used  Substance Use Topics   Alcohol use: Yes    Comment: occasionally   Drug use: No     Allergies   E-mycin [erythromycin], Lexapro  [escitalopram  oxalate], Augmentin  [amoxicillin -pot clavulanate], Lactose intolerance (gi), Covid-19 mrna vaccine (pfizer) [covid-19 mrna vacc (moderna)], and Gadolinium   Review of Systems Review of Systems  Cardiovascular:  Positive for chest pain.     Physical Exam Triage Vital Signs ED Triage Vitals  Encounter Vitals Group     BP      Girls Systolic BP Percentile      Girls Diastolic BP Percentile      Boys Systolic BP Percentile      Boys Diastolic BP Percentile      Pulse      Resp      Temp      Temp src      SpO2      Weight      Height      Head Circumference      Peak Flow      Pain Score      Pain Loc      Pain Education      Exclude from Growth Chart    No data found.  Updated Vital Signs BP (!) 120/90   Pulse 84   Temp 98.3 F (36.8 C)   Resp 19   SpO2 98%    Physical Exam Vitals and nursing note reviewed.  Constitutional:      Appearance: Normal appearance. She is normal weight.  HENT:     Head: Normocephalic and atraumatic.     Mouth/Throat:     Mouth: Mucous membranes are moist.     Pharynx: Oropharynx is  clear.  Eyes:     Extraocular Movements: Extraocular movements intact.     Conjunctiva/sclera: Conjunctivae normal.     Pupils: Pupils are equal, round, and reactive to light.  Cardiovascular:     Rate and Rhythm: Normal rate and regular rhythm.     Pulses: Normal pulses.     Heart sounds: Normal heart sounds.  Pulmonary:     Effort: Pulmonary effort is normal.     Breath sounds: Normal breath sounds. No wheezing, rhonchi or rales.  Musculoskeletal:         General: Normal range of motion.  Skin:    General: Skin is warm and dry.  Neurological:     General: No focal deficit present.     Mental Status: She is alert and oriented to person, place, and time. Mental status is at baseline.  Psychiatric:        Mood and Affect: Mood normal.        Behavior: Behavior normal.      UC Treatments / Results  Labs (all labs ordered are listed, but only abnormal results are displayed) Labs Reviewed - No data to display  EKG   Radiology No results found.  Procedures Procedures (including critical care time)  Medications Ordered in UC Medications - No data to display  Initial Impression / Assessment and Plan / UC Course  I have reviewed the triage vital signs and the nursing notes.  Pertinent labs & imaging results that were available during my care of the patient were reviewed by me and considered in my medical decision making (see chart for details).     MDM: 1.  Chest pain, unspecified-Advised patient EKG was within normal limits as well as physical exam.  Advised watchful waiting at this point.  Advised if symptoms worsen and/or unresolved please follow-up with your PCP or here for further evaluation.  Patient discharged home, hemodynamically stable. Final Clinical Impressions(s) / UC Diagnoses   Final diagnoses:  Chest pain, unspecified type     Discharge Instructions      Advised patient EKG was within normal limits as well as physical exam.  Advised watchful waiting at this point.  Advised if symptoms worsen and/or unresolved please follow-up with your PCP or here for further evaluation.     ED Prescriptions   None    PDMP not reviewed this encounter.   Teddy Sharper, FNP 08/21/24 1742

## 2024-10-05 ENCOUNTER — Encounter: Payer: Self-pay | Admitting: Physician Assistant

## 2024-10-05 DIAGNOSIS — F909 Attention-deficit hyperactivity disorder, unspecified type: Secondary | ICD-10-CM

## 2024-10-07 MED ORDER — AMPHETAMINE-DEXTROAMPHET ER 20 MG PO CP24: 20.0000 mg | ORAL_CAPSULE | ORAL | 0 refills | Status: DC

## 2024-10-07 NOTE — Telephone Encounter (Signed)
 Hx of adderall  use. No concerns of abuse. Refilled for one month will follow up in one month.

## 2024-10-22 ENCOUNTER — Ambulatory Visit: Admitting: Physician Assistant

## 2024-10-22 ENCOUNTER — Encounter: Payer: Self-pay | Admitting: Physician Assistant

## 2024-10-22 VITALS — BP 128/74 | HR 76 | Ht 68.0 in | Wt 130.0 lb

## 2024-10-22 DIAGNOSIS — Z23 Encounter for immunization: Secondary | ICD-10-CM | POA: Diagnosis not present

## 2024-10-22 DIAGNOSIS — F430 Acute stress reaction: Secondary | ICD-10-CM | POA: Diagnosis not present

## 2024-10-22 DIAGNOSIS — B001 Herpesviral vesicular dermatitis: Secondary | ICD-10-CM | POA: Diagnosis not present

## 2024-10-22 DIAGNOSIS — F331 Major depressive disorder, recurrent, moderate: Secondary | ICD-10-CM

## 2024-10-22 DIAGNOSIS — F909 Attention-deficit hyperactivity disorder, unspecified type: Secondary | ICD-10-CM | POA: Diagnosis not present

## 2024-10-22 DIAGNOSIS — Z8639 Personal history of other endocrine, nutritional and metabolic disease: Secondary | ICD-10-CM

## 2024-10-22 MED ORDER — VALACYCLOVIR HCL 1 G PO TABS: 2000.0000 mg | ORAL_TABLET | Freq: Two times a day (BID) | ORAL | 1 refills | Status: AC | PRN

## 2024-10-22 MED ORDER — AMPHETAMINE-DEXTROAMPHET ER 20 MG PO CP24: 20.0000 mg | ORAL_CAPSULE | ORAL | 0 refills | Status: AC

## 2024-10-22 MED ORDER — ALPRAZOLAM 0.5 MG PO TABS: 0.5000 mg | ORAL_TABLET | Freq: Every evening | ORAL | 2 refills | Status: AC | PRN

## 2024-10-22 MED ORDER — SEMAGLUTIDE-WEIGHT MANAGEMENT 0.5 MG/0.5ML ~~LOC~~ SOAJ: SUBCUTANEOUS | Status: AC

## 2024-10-22 MED ORDER — FLUOXETINE HCL 20 MG PO CAPS: 20.0000 mg | ORAL_CAPSULE | Freq: Every day | ORAL | 3 refills | Status: AC

## 2024-10-22 NOTE — Progress Notes (Signed)
 "  Established Patient Office Visit  Subjective   Patient ID: Jacqueline Hughes, female    DOB: 03-20-1966  Age: 58 y.o. MRN: [id number]  Chief Complaint  Patient presents with   Medical Management of Chronic Issues    HPI .SABRADiscussed the use of AI scribe software for clinical note transcription with the patient, who gave verbal consent to proceed.  History of Present Illness Jacqueline Hughes is a 58 year old female who presents for medication refills and follow-up.  Attention deficit hyperactivity disorder (adhd) - Currently taking Adderall  XR 20 mg daily - Occasionally misses doses when on vacation - sleeping better - anxiety and depression controlled  Mood and sleep disturbance - Taking fluoxetine  20 mg, reduced from a higher dose - Sleep has improved slightly - Uncertain about current supply of alprazolam  but uses as needed very sparingly  Weight management and physical activity - On semaglutide , titrated to full dose over the past eight months - Achieved 30-pound weight loss, currently maintaining weight at 130 pounds - Active at work, walking 10,000 steps daily - Does not maintain this activity level outside of work - Swims regularly during summer, not possible at this time of year  Urinary symptoms - Discontinued Gemtesa  for bladder issues - symptoms resolved  Musculoskeletal symptoms - No current need for cyclobenzaprine   Cardiopulmonary symptoms - No chest pain, shortness of breath, or swelling    ROS See HPI.    Objective:     BP 128/74   Pulse 76   Ht 5' 8 (1.727 m)   Wt 130 lb (59 kg)   SpO2 99%   BMI 19.77 kg/m  BP Readings from Last 3 Encounters:  10/22/24 128/74  08/21/24 (!) 120/90  04/26/24 104/67   Wt Readings from Last 3 Encounters:  10/22/24 130 lb (59 kg)  02/14/24 157 lb (71.2 kg)  01/19/24 158 lb 8 oz (71.9 kg)    .    10/22/2024    2:16 PM 01/19/2024    3:40 PM 08/29/2023    3:35 PM 08/29/2023    3:15 PM 01/03/2023    8:31 AM   Depression screen PHQ 2/9  Decreased Interest 0 0 1 1 0  Down, Depressed, Hopeless 0 0 2 2 0  PHQ - 2 Score 0 0 3 3 0  Altered sleeping 2 0 3  1  Tired, decreased energy 1 0 2  1  Change in appetite 0 0 1  0  Feeling bad or failure about yourself  0 0 0  0  Trouble concentrating 1 0 2  2  Moving slowly or fidgety/restless 0 0 1  0  Suicidal thoughts  0 0  0  PHQ-9 Score 4 0  12   4   Difficult doing work/chores Not difficult at all Not difficult at all   Somewhat difficult     Data saved with a previous flowsheet row definition   ..    10/22/2024    2:17 PM 08/29/2023    3:35 PM 01/03/2023    8:31 AM 10/04/2019    3:39 PM  GAD 7 : Generalized Anxiety Score  Nervous, Anxious, on Edge 1 1 1 1   Control/stop worrying 1 1 0 1  Worry too much - different things 1 1 0 1  Trouble relaxing 1 1 1 1   Restless 1 1 1 1   Easily annoyed or irritable 1 1 1 1   Afraid - awful might happen 0 0 0 1  Total  GAD 7 Score 6 6 4 7   Anxiety Difficulty Somewhat difficult Not difficult at all Somewhat difficult Somewhat difficult      Physical Exam Constitutional:      Appearance: Normal appearance.  HENT:     Head: Normocephalic.  Cardiovascular:     Rate and Rhythm: Normal rate and regular rhythm.  Pulmonary:     Effort: Pulmonary effort is normal.     Breath sounds: Normal breath sounds.  Musculoskeletal:     Right lower leg: No edema.     Left lower leg: No edema.  Neurological:     General: No focal deficit present.     Mental Status: She is alert and oriented to person, place, and time.  Psychiatric:        Mood and Affect: Mood normal.      The 10-year ASCVD risk score (Arnett DK, et al., 2019) is: 2.4%    Assessment & Plan:  SABRASABRAFaith was seen today for medical management of chronic issues.  Diagnoses and all orders for this visit:  Adult ADHD -     amphetamine -dextroamphetamine  (ADDERALL  XR) 20 MG 24 hr capsule; Take 1 capsule (20 mg total) by mouth every morning. -      amphetamine -dextroamphetamine  (ADDERALL  XR) 20 MG 24 hr capsule; Take 1 capsule (20 mg total) by mouth every morning. -     amphetamine -dextroamphetamine  (ADDERALL  XR) 20 MG 24 hr capsule; Take 1 capsule (20 mg total) by mouth every morning.  Moderate episode of recurrent major depressive disorder (HCC) -     FLUoxetine  (PROZAC ) 20 MG capsule; Take 1 capsule (20 mg total) by mouth daily. To take with 10mg .  Acute stress reaction -     ALPRAZolam  (XANAX ) 0.5 MG tablet; Take 1 tablet (0.5 mg total) by mouth at bedtime as needed for anxiety.  Recurrent cold sores -     valACYclovir  (VALTREX ) 1000 MG tablet; Take 2 tablets (2,000 mg total) by mouth 2 (two) times daily as needed. For cold sore  Immunization due -     Flu vaccine trivalent PF, 6mos and older(Flulaval,Afluria,Fluarix,Fluzone)  History of obesity -     semaglutide -weight management (WEGOVY ) 0.5 MG/0.5ML SOAJ SQ injection; 40 units weekly.   Assessment & Plan Attention-deficit hyperactivity disorder Managed with Adderall  XR 20 mg daily, adheres to regimen with occasional breaks. - Continue Adderall  XR 20 mg daily. - Provided refill for Adderall  XR.  Major depressive disorder, recurrent, in remission Managed with fluoxetine  20 mg daily, adheres to 20 mg dose. - Continue fluoxetine  20 mg daily. - Provided refill for fluoxetine . - xanax  refill to use as needed  Recurrent cold sores - Valtrex  used to treat exacerbations  Weight management/history of obesity On semaglutide  for eight months, lost 30 pounds, currently in maintenance phase, active with 10,000 steps daily and swimming. - Continue semaglutide  at current dose.     Return in about 6 months (around 04/22/2025).    Jacqueline Adrian, PA-C  "
